# Patient Record
Sex: Female | Born: 1950 | Race: White | Hispanic: No | Marital: Married | Smoking: Never smoker
Health system: Southern US, Community
[De-identification: ages and names within clinical notes are randomized; demographics above are authoritative.]

## PROBLEM LIST (undated history)

## (undated) DIAGNOSIS — F329 Major depressive disorder, single episode, unspecified: Secondary | ICD-10-CM

## (undated) DIAGNOSIS — R945 Abnormal results of liver function studies: Secondary | ICD-10-CM

## (undated) DIAGNOSIS — Z860100 Personal history of colon polyps, unspecified: Secondary | ICD-10-CM

## (undated) DIAGNOSIS — F419 Anxiety disorder, unspecified: Secondary | ICD-10-CM

## (undated) DIAGNOSIS — K912 Postsurgical malabsorption, not elsewhere classified: Secondary | ICD-10-CM

## (undated) DIAGNOSIS — K900 Celiac disease: Secondary | ICD-10-CM

## (undated) DIAGNOSIS — F32A Depression, unspecified: Secondary | ICD-10-CM

## (undated) DIAGNOSIS — F319 Bipolar disorder, unspecified: Secondary | ICD-10-CM

## (undated) DIAGNOSIS — J309 Allergic rhinitis, unspecified: Secondary | ICD-10-CM

## (undated) DIAGNOSIS — M810 Age-related osteoporosis without current pathological fracture: Secondary | ICD-10-CM

## (undated) DIAGNOSIS — R7989 Other specified abnormal findings of blood chemistry: Secondary | ICD-10-CM

## (undated) DIAGNOSIS — D72819 Decreased white blood cell count, unspecified: Secondary | ICD-10-CM

## (undated) DIAGNOSIS — K567 Ileus, unspecified: Secondary | ICD-10-CM

## (undated) DIAGNOSIS — K9189 Other postprocedural complications and disorders of digestive system: Secondary | ICD-10-CM

## (undated) DIAGNOSIS — R748 Abnormal levels of other serum enzymes: Secondary | ICD-10-CM

## (undated) DIAGNOSIS — E039 Hypothyroidism, unspecified: Secondary | ICD-10-CM

## (undated) DIAGNOSIS — I4510 Unspecified right bundle-branch block: Secondary | ICD-10-CM

## (undated) DIAGNOSIS — N200 Calculus of kidney: Secondary | ICD-10-CM

## (undated) HISTORY — PX: TUBAL LIGATION: SHX77

## (undated) HISTORY — DX: Other specified abnormal findings of blood chemistry: R79.89

## (undated) HISTORY — DX: Allergic rhinitis, unspecified: J30.9

## (undated) HISTORY — PX: COLONOSCOPY W/ POLYPECTOMY: SHX1380

## (undated) HISTORY — PX: TONSILLECTOMY AND ADENOIDECTOMY: SUR1326

## (undated) HISTORY — DX: Abnormal levels of other serum enzymes: R74.8

## (undated) HISTORY — DX: Unspecified right bundle-branch block: I45.10

## (undated) HISTORY — DX: Decreased white blood cell count, unspecified: D72.819

## (undated) HISTORY — DX: Major depressive disorder, single episode, unspecified: F32.9

## (undated) HISTORY — DX: Calculus of kidney: N20.0

## (undated) HISTORY — PX: COLON SURGERY: SHX602

## (undated) HISTORY — DX: Age-related osteoporosis without current pathological fracture: M81.0

## (undated) HISTORY — DX: Abnormal results of liver function studies: R94.5

## (undated) HISTORY — PX: CYSTECTOMY: SUR359

## (undated) HISTORY — DX: Depression, unspecified: F32.A

## (undated) HISTORY — PX: FOOT SURGERY: SHX648

---

## 1997-11-22 ENCOUNTER — Other Ambulatory Visit: Admission: RE | Admit: 1997-11-22 | Discharge: 1997-11-22 | Payer: Self-pay | Admitting: Obstetrics and Gynecology

## 1998-03-11 ENCOUNTER — Ambulatory Visit (HOSPITAL_BASED_OUTPATIENT_CLINIC_OR_DEPARTMENT_OTHER): Admission: RE | Admit: 1998-03-11 | Discharge: 1998-03-11 | Payer: Self-pay | Admitting: General Surgery

## 1999-06-17 ENCOUNTER — Encounter: Admission: RE | Admit: 1999-06-17 | Discharge: 1999-06-17 | Payer: Self-pay | Admitting: Obstetrics and Gynecology

## 1999-06-17 ENCOUNTER — Encounter: Payer: Self-pay | Admitting: Obstetrics and Gynecology

## 1999-06-23 ENCOUNTER — Encounter: Admission: RE | Admit: 1999-06-23 | Discharge: 1999-06-23 | Payer: Self-pay | Admitting: Obstetrics and Gynecology

## 1999-06-23 ENCOUNTER — Encounter: Payer: Self-pay | Admitting: Obstetrics and Gynecology

## 2000-03-22 ENCOUNTER — Other Ambulatory Visit: Admission: RE | Admit: 2000-03-22 | Discharge: 2000-03-22 | Payer: Self-pay | Admitting: Obstetrics and Gynecology

## 2000-11-09 ENCOUNTER — Encounter: Admission: RE | Admit: 2000-11-09 | Discharge: 2000-11-09 | Payer: Self-pay | Admitting: Obstetrics and Gynecology

## 2000-11-09 ENCOUNTER — Encounter: Payer: Self-pay | Admitting: Obstetrics and Gynecology

## 2001-03-10 ENCOUNTER — Other Ambulatory Visit: Admission: RE | Admit: 2001-03-10 | Discharge: 2001-03-10 | Payer: Self-pay | Admitting: Obstetrics and Gynecology

## 2001-11-14 ENCOUNTER — Encounter: Payer: Self-pay | Admitting: Obstetrics and Gynecology

## 2001-11-14 ENCOUNTER — Encounter: Admission: RE | Admit: 2001-11-14 | Discharge: 2001-11-14 | Payer: Self-pay | Admitting: Obstetrics and Gynecology

## 2002-06-01 ENCOUNTER — Other Ambulatory Visit: Admission: RE | Admit: 2002-06-01 | Discharge: 2002-06-01 | Payer: Self-pay | Admitting: Obstetrics and Gynecology

## 2003-06-07 ENCOUNTER — Other Ambulatory Visit: Admission: RE | Admit: 2003-06-07 | Discharge: 2003-06-07 | Payer: Self-pay | Admitting: Obstetrics and Gynecology

## 2003-07-01 ENCOUNTER — Encounter: Admission: RE | Admit: 2003-07-01 | Discharge: 2003-07-01 | Payer: Self-pay | Admitting: Obstetrics and Gynecology

## 2004-07-20 ENCOUNTER — Other Ambulatory Visit: Admission: RE | Admit: 2004-07-20 | Discharge: 2004-07-20 | Payer: Self-pay | Admitting: Obstetrics and Gynecology

## 2005-06-25 ENCOUNTER — Other Ambulatory Visit: Admission: RE | Admit: 2005-06-25 | Discharge: 2005-06-25 | Payer: Self-pay | Admitting: Obstetrics and Gynecology

## 2005-07-13 ENCOUNTER — Encounter: Admission: RE | Admit: 2005-07-13 | Discharge: 2005-07-13 | Payer: Self-pay | Admitting: Obstetrics & Gynecology

## 2005-08-06 ENCOUNTER — Encounter: Admission: RE | Admit: 2005-08-06 | Discharge: 2005-08-06 | Payer: Self-pay | Admitting: Obstetrics & Gynecology

## 2006-02-17 ENCOUNTER — Ambulatory Visit: Payer: Self-pay | Admitting: Family Medicine

## 2006-03-02 ENCOUNTER — Ambulatory Visit: Payer: Self-pay | Admitting: Internal Medicine

## 2006-03-08 ENCOUNTER — Ambulatory Visit: Payer: Self-pay | Admitting: Family Medicine

## 2006-04-06 LAB — CBC WITH DIFFERENTIAL/PLATELET
BASO%: 0.2 % (ref 0.0–2.0)
Basophils Absolute: 0 10*3/uL (ref 0.0–0.1)
EOS%: 0.5 % (ref 0.0–7.0)
Eosinophils Absolute: 0 10*3/uL (ref 0.0–0.5)
HCT: 36.5 % (ref 34.8–46.6)
HGB: 12.7 g/dL (ref 11.6–15.9)
LYMPH%: 16.5 % (ref 14.0–48.0)
MCH: 32.6 pg (ref 26.0–34.0)
MCHC: 34.8 g/dL (ref 32.0–36.0)
MCV: 93.7 fL (ref 81.0–101.0)
MONO#: 0.3 10*3/uL (ref 0.1–0.9)
MONO%: 11.5 % (ref 0.0–13.0)
NEUT#: 1.7 10*3/uL (ref 1.5–6.5)
NEUT%: 71.3 % (ref 39.6–76.8)
Platelets: 276 10*3/uL (ref 145–400)
RBC: 3.9 10*6/uL (ref 3.70–5.32)
RDW: 13.5 % (ref 11.3–14.5)
WBC: 2.4 10*3/uL — ABNORMAL LOW (ref 3.9–10.0)
lymph#: 0.4 10*3/uL — ABNORMAL LOW (ref 0.9–3.3)

## 2006-04-06 LAB — LACTATE DEHYDROGENASE: LDH: 142 U/L (ref 94–250)

## 2006-04-07 LAB — HEPATITIS B SURFACE ANTIGEN: Hepatitis B Surface Ag: NEGATIVE

## 2006-04-07 LAB — HEPATITIS B CORE ANTIBODY, IGM: Hep B C IgM: NEGATIVE

## 2006-04-07 LAB — HEPATITIS B CORE ANTIBODY, TOTAL: Hep B Core Total Ab: NEGATIVE

## 2006-04-07 LAB — HEPATITIS C ANTIBODY: HCV Ab: NEGATIVE

## 2006-04-07 LAB — HIV ANTIBODY (ROUTINE TESTING W REFLEX)

## 2006-04-07 LAB — HEPATITIS B SURFACE ANTIBODY,QUALITATIVE: Hep B S Ab: NEGATIVE

## 2006-04-22 ENCOUNTER — Encounter (INDEPENDENT_AMBULATORY_CARE_PROVIDER_SITE_OTHER): Payer: Self-pay | Admitting: Specialist

## 2006-04-22 ENCOUNTER — Ambulatory Visit (HOSPITAL_COMMUNITY): Admission: RE | Admit: 2006-04-22 | Discharge: 2006-04-22 | Payer: Self-pay | Admitting: Internal Medicine

## 2006-04-22 ENCOUNTER — Ambulatory Visit: Payer: Self-pay | Admitting: Internal Medicine

## 2006-07-20 ENCOUNTER — Other Ambulatory Visit: Admission: RE | Admit: 2006-07-20 | Discharge: 2006-07-20 | Payer: Self-pay | Admitting: Obstetrics and Gynecology

## 2006-08-11 ENCOUNTER — Encounter: Admission: RE | Admit: 2006-08-11 | Discharge: 2006-08-11 | Payer: Self-pay | Admitting: Obstetrics and Gynecology

## 2007-07-25 ENCOUNTER — Other Ambulatory Visit: Admission: RE | Admit: 2007-07-25 | Discharge: 2007-07-25 | Payer: Self-pay | Admitting: Obstetrics and Gynecology

## 2007-08-22 ENCOUNTER — Ambulatory Visit: Payer: Self-pay | Admitting: Family Medicine

## 2007-09-04 ENCOUNTER — Encounter: Admission: RE | Admit: 2007-09-04 | Discharge: 2007-09-04 | Payer: Self-pay | Admitting: Obstetrics and Gynecology

## 2008-03-01 ENCOUNTER — Ambulatory Visit: Payer: Self-pay | Admitting: Family Medicine

## 2008-03-22 ENCOUNTER — Ambulatory Visit: Payer: Self-pay | Admitting: Family Medicine

## 2008-08-26 ENCOUNTER — Other Ambulatory Visit: Admission: RE | Admit: 2008-08-26 | Discharge: 2008-08-26 | Payer: Self-pay | Admitting: Obstetrics and Gynecology

## 2008-09-17 ENCOUNTER — Encounter: Admission: RE | Admit: 2008-09-17 | Discharge: 2008-09-17 | Payer: Self-pay

## 2008-09-25 ENCOUNTER — Encounter: Admission: RE | Admit: 2008-09-25 | Discharge: 2008-09-25 | Payer: Self-pay | Admitting: Obstetrics and Gynecology

## 2008-10-10 ENCOUNTER — Encounter: Admission: RE | Admit: 2008-10-10 | Discharge: 2008-10-10 | Payer: Self-pay | Admitting: Allergy

## 2009-04-04 ENCOUNTER — Ambulatory Visit: Payer: Self-pay | Admitting: Family Medicine

## 2009-06-12 ENCOUNTER — Ambulatory Visit: Payer: Self-pay | Admitting: Family Medicine

## 2009-06-12 ENCOUNTER — Encounter (INDEPENDENT_AMBULATORY_CARE_PROVIDER_SITE_OTHER): Payer: Self-pay | Admitting: *Deleted

## 2009-06-24 ENCOUNTER — Encounter (INDEPENDENT_AMBULATORY_CARE_PROVIDER_SITE_OTHER): Payer: Self-pay | Admitting: *Deleted

## 2009-06-25 ENCOUNTER — Ambulatory Visit: Payer: Self-pay | Admitting: Gastroenterology

## 2009-06-26 ENCOUNTER — Ambulatory Visit: Payer: Self-pay | Admitting: Family Medicine

## 2009-07-25 ENCOUNTER — Ambulatory Visit: Payer: Self-pay | Admitting: Family Medicine

## 2009-08-08 ENCOUNTER — Telehealth: Payer: Self-pay | Admitting: Gastroenterology

## 2009-08-12 ENCOUNTER — Ambulatory Visit: Payer: Self-pay | Admitting: Family Medicine

## 2009-10-02 ENCOUNTER — Encounter: Admission: RE | Admit: 2009-10-02 | Discharge: 2009-10-02 | Payer: Self-pay | Admitting: Obstetrics and Gynecology

## 2009-10-24 ENCOUNTER — Encounter: Admission: RE | Admit: 2009-10-24 | Discharge: 2009-10-24 | Payer: Self-pay | Admitting: Obstetrics and Gynecology

## 2010-04-03 ENCOUNTER — Encounter: Admission: RE | Admit: 2010-04-03 | Discharge: 2010-04-03 | Payer: Self-pay | Admitting: Allergy

## 2010-04-06 ENCOUNTER — Ambulatory Visit: Payer: Self-pay | Admitting: Family Medicine

## 2010-06-01 ENCOUNTER — Encounter
Admission: RE | Admit: 2010-06-01 | Discharge: 2010-06-01 | Payer: Self-pay | Source: Home / Self Care | Attending: Otolaryngology | Admitting: Otolaryngology

## 2010-06-14 ENCOUNTER — Encounter: Payer: Self-pay | Admitting: Obstetrics and Gynecology

## 2010-06-14 ENCOUNTER — Encounter: Payer: Self-pay | Admitting: Obstetrics & Gynecology

## 2010-06-18 ENCOUNTER — Ambulatory Visit: Admit: 2010-06-18 | Payer: Self-pay | Admitting: Family Medicine

## 2010-06-22 ENCOUNTER — Ambulatory Visit
Admission: RE | Admit: 2010-06-22 | Discharge: 2010-06-22 | Payer: Self-pay | Source: Home / Self Care | Attending: Family Medicine | Admitting: Family Medicine

## 2010-06-23 NOTE — Miscellaneous (Signed)
Summary: [facility] Previsit/prep  Clinical Lists Changes  Medications: Added new medication of MOVIPREP 100 GM  SOLR (PEG-KCL-NACL-NASULF-NA ASC-C) As per prep instructions. - Signed Rx of MOVIPREP 100 GM  SOLR (PEG-KCL-NACL-NASULF-NA ASC-C) As per prep instructions.;  #1 x 0;  Signed;  Entered by: [name] RN;  Authorized by: Louis Meckel MD;  Method used: Electronically to [facility]*, [address], [place], [place]  [id number], Ph: [phone], Fax: [phone] Allergies: Added new allergy or adverse reaction of SULFA Observations: Added new observation of NKA: F (06/25/2009 8:29)    Prescriptions: MOVIPREP 100 GM  SOLR (PEG-KCL-NACL-NASULF-NA ASC-C) As per prep instructions.  #1 x 0   Entered by:   [name] RN   Authorized by:   Louis Meckel MD   Signed by:   [name] RN on 06/25/2009   Method used:   Electronically to        [facility]* (retail)       [address]       [place], [place]  [id number]       Ph: [phone]       Fax: [phone]   RxID:   [id number]

## 2010-06-23 NOTE — Letter (Signed)
Summary: Previsit letter  [facility]  [address] [place], [place] [address]   Phone: [phone]  Fax: [phone]       06/12/2009 MRN: [id number]  Mary Leach [address] [place], [place]  [address]  Dear Ms. Mary Leach,  Welcome to the Gastroenterology Division at [facility].    You are scheduled to see a nurse for your pre-procedure visit on 06-25-09 at 1pm on the 3rd floor at [facility], [address]  We ask that you try to arrive at our office 15 minutes prior to your appointment time to allow for check-in.  Your nurse visit will consist of discussing your medical and surgical history, your immediate family medical history, and your medications.    Please bring a complete list of all your medications or, if you prefer, bring the medication bottles and we will list them.  We will need to be aware of both prescribed and over the counter drugs.  We will need to know exact dosage information as well.  If you are on blood thinners (Coumadin, Plavix, Aggrenox, Ticlid, etc.) please call our office today/prior to your appointment, as we need to consult with your physician about holding your medication.   Please be prepared to read and sign documents such as consent forms, a financial agreement, and acknowledgement forms.  If necessary, and with your consent, a friend or relative is welcome to sit-in on the nurse visit with you.  Please bring your insurance card so that we may make a copy of it.  If your insurance requires a referral to see a specialist, please bring your referral form from your primary care physician.  No co-pay is required for this nurse visit.     If you cannot keep your appointment, please call [phone] to cancel or reschedule prior to your appointment date.  This allows [place] the opportunity to schedule an appointment for another patient in need of care.    Thank you for choosing  Gastroenterology for your medical needs.  We  appreciate the opportunity to care for you.  Please visit [place] at our website  to learn more about our practice.                     Sincerely.                                                                                                                   The Gastroenterology Division

## 2010-06-23 NOTE — Letter (Signed)
Summary: [facility] Instructions  [facility]  [address] [place], [place] [address]   Phone: [phone]  Fax: [phone]       Mary Leach    July 23, 1950    MRN: [id number]        Procedure Day Dorna Bloom:  Mary Leach  07/08/09     Arrival Time:  10:30AM     Procedure Time:  11:30AM     Location of Procedure:                    _X _  [facility] (4th Floor)                        PREPARATION FOR COLONOSCOPY WITH MOVIPREP   Starting 5 days prior to your procedure 07/03/09 do not eat nuts, seeds, popcorn, corn, beans, peas,  salads, or any raw vegetables.  Do not take any fiber supplements (e.g. Metamucil, Citrucel, and Benefiber).  THE DAY BEFORE YOUR PROCEDURE         DATE: 07/07/09  DAY: MONDAY  1.  Drink clear liquids the entire day-NO SOLID FOOD  2.  Do not drink anything colored red or purple.  Avoid juices with pulp.  No orange juice.  3.  Drink at least 64 oz. (8 glasses) of fluid/clear liquids during the day to prevent dehydration and help the prep work efficiently.  CLEAR LIQUIDS INCLUDE: Water Jello Ice Popsicles Tea (sugar ok, no milk/cream) Powdered fruit flavored drinks Coffee (sugar ok, no milk/cream) Gatorade Juice: apple, white grape, white cranberry  Lemonade Clear bullion, consomm, broth Carbonated beverages (any kind) Strained chicken noodle soup Hard Candy                             4.  In the morning, mix first dose of MoviPrep solution:    Empty 1 Pouch A and 1 Pouch B into the disposable container    Add lukewarm drinking water to the top line of the container. Mix to dissolve    Refrigerate (mixed solution should be used within 24 hrs)  5.  Begin drinking the prep at 5:00 p.m. The MoviPrep container is divided by 4 marks.   Every 15 minutes drink the solution down to the next mark (approximately 8 oz) until the full liter is complete.   6.  Follow completed prep with 16 oz of clear liquid of your choice (Nothing  red or purple).  Continue to drink clear liquids until bedtime.  7.  Before going to bed, mix second dose of MoviPrep solution:    Empty 1 Pouch A and 1 Pouch B into the disposable container    Add lukewarm drinking water to the top line of the container. Mix to dissolve    Refrigerate  THE DAY OF YOUR PROCEDURE      DATE: 07/08/09  DAY: TUESDAY  Beginning at 6:30AM (5 hours before procedure):         1. Every 15 minutes, drink the solution down to the next mark (approx 8 oz) until the full liter is complete.  2. Follow completed prep with 16 oz. of clear liquid of your choice.    3. You may drink clear liquids until 9:30AM (2 HOURS BEFORE PROCEDURE).   MEDICATION INSTRUCTIONS  Unless otherwise instructed, you should take regular prescription medications with a small sip of water   as early as possible the morning  of your procedure.         OTHER INSTRUCTIONS  You will need a responsible adult at least 59 years of age to accompany you and drive you home.   This person must remain in the waiting room during your procedure.  Wear loose fitting clothing that is easily removed.  Leave jewelry and other valuables at home.  However, you may wish to bring a book to read or  an iPod/MP3 player to listen to music as you wait for your procedure to start.  Remove all body piercing jewelry and leave at home.  Total time from sign-in until discharge is approximately 2-3 hours.  You should go home directly after your procedure and rest.  You can resume normal activities the  day after your procedure.  The day of your procedure you should not:   Drive   Make legal decisions   Operate machinery   Drink alcohol   Return to work  You will receive specific instructions about eating, activities and medications before you leave.    The above instructions have been reviewed and explained to me by   [name] RN  June 25, 2009 8:53 AM     I fully understand and  can verbalize these instructions _____________________________ Date _________

## 2010-06-23 NOTE — Progress Notes (Signed)
Summary: cx proc   Phone Note Call from Patient Call back at Home Phone [phone]   Caller: Patient Call For: Mary Leach Reason for Call: Talk to Nurse Summary of Call: Patient cancelled he procedure for Tues because her daughter just had a baby and she is out of town, states that she'll call back to reschedule on Monday   Are you going to charge? Initial call taken by: Tawni Levy,  August 08, 2009 10:58 AM  Follow-up for Phone Call        no Follow-up by: Louis Meckel MD,  August 11, 2009 9:31 AM  Additional Follow-up for Phone Call Additional follow up Details #1::        Patient NOT BILLED. Additional Follow-up by: Leanor Kail [facility],  August 20, 2009 12:31 PM

## 2010-08-13 ENCOUNTER — Ambulatory Visit (INDEPENDENT_AMBULATORY_CARE_PROVIDER_SITE_OTHER): Payer: 59 | Admitting: Family Medicine

## 2010-08-13 ENCOUNTER — Other Ambulatory Visit: Payer: Self-pay | Admitting: Family Medicine

## 2010-08-13 DIAGNOSIS — M25562 Pain in left knee: Secondary | ICD-10-CM

## 2010-08-13 DIAGNOSIS — M25569 Pain in unspecified knee: Secondary | ICD-10-CM

## 2010-08-17 ENCOUNTER — Ambulatory Visit
Admission: RE | Admit: 2010-08-17 | Discharge: 2010-08-17 | Disposition: A | Discharge status: Home / Self Care | Payer: 59 | Source: Ambulatory Visit | Attending: Family Medicine | Admitting: Family Medicine

## 2010-08-17 DIAGNOSIS — M25562 Pain in left knee: Secondary | ICD-10-CM

## 2010-09-18 ENCOUNTER — Other Ambulatory Visit: Payer: Self-pay | Admitting: Obstetrics and Gynecology

## 2010-09-18 DIAGNOSIS — Z1231 Encounter for screening mammogram for malignant neoplasm of breast: Secondary | ICD-10-CM

## 2010-10-05 ENCOUNTER — Ambulatory Visit
Admission: RE | Admit: 2010-10-05 | Discharge: 2010-10-05 | Disposition: A | Discharge status: Home / Self Care | Payer: 59 | Source: Ambulatory Visit | Attending: Obstetrics and Gynecology | Admitting: Obstetrics and Gynecology

## 2010-10-05 DIAGNOSIS — Z1231 Encounter for screening mammogram for malignant neoplasm of breast: Secondary | ICD-10-CM

## 2010-10-21 LAB — HM COLONOSCOPY

## 2011-04-20 ENCOUNTER — Other Ambulatory Visit (INDEPENDENT_AMBULATORY_CARE_PROVIDER_SITE_OTHER): Payer: 59

## 2011-04-20 DIAGNOSIS — Z23 Encounter for immunization: Secondary | ICD-10-CM

## 2011-04-21 ENCOUNTER — Encounter: Payer: Self-pay | Admitting: Family Medicine

## 2011-09-27 ENCOUNTER — Other Ambulatory Visit: Payer: Self-pay | Admitting: Obstetrics and Gynecology

## 2011-09-27 DIAGNOSIS — R1901 Right upper quadrant abdominal swelling, mass and lump: Secondary | ICD-10-CM

## 2011-09-28 ENCOUNTER — Ambulatory Visit
Admission: RE | Admit: 2011-09-28 | Discharge: 2011-09-28 | Disposition: A | Discharge status: Home / Self Care | Payer: 59 | Source: Ambulatory Visit | Attending: Obstetrics and Gynecology | Admitting: Obstetrics and Gynecology

## 2011-09-28 DIAGNOSIS — R1901 Right upper quadrant abdominal swelling, mass and lump: Secondary | ICD-10-CM

## 2011-10-07 ENCOUNTER — Inpatient Hospital Stay (HOSPITAL_COMMUNITY)
Admission: EM | Admit: 2011-10-07 | Discharge: 2011-10-20 | DRG: 330 | Disposition: A | Discharge status: Home / Self Care | Payer: 59 | Attending: General Surgery | Admitting: General Surgery

## 2011-10-07 ENCOUNTER — Encounter (HOSPITAL_COMMUNITY): Payer: Self-pay | Admitting: *Deleted

## 2011-10-07 ENCOUNTER — Inpatient Hospital Stay (HOSPITAL_COMMUNITY): Payer: 59 | Admitting: Anesthesiology

## 2011-10-07 ENCOUNTER — Emergency Department (HOSPITAL_COMMUNITY): Payer: 59

## 2011-10-07 ENCOUNTER — Encounter (HOSPITAL_COMMUNITY): Payer: Self-pay | Admitting: Anesthesiology

## 2011-10-07 ENCOUNTER — Encounter (HOSPITAL_COMMUNITY): Admission: EM | Disposition: A | Discharge status: Home / Self Care | Payer: Self-pay | Source: Home / Self Care

## 2011-10-07 DIAGNOSIS — Z79899 Other long term (current) drug therapy: Secondary | ICD-10-CM

## 2011-10-07 DIAGNOSIS — K9189 Other postprocedural complications and disorders of digestive system: Secondary | ICD-10-CM | POA: Diagnosis not present

## 2011-10-07 DIAGNOSIS — K631 Perforation of intestine (nontraumatic): Secondary | ICD-10-CM | POA: Diagnosis present

## 2011-10-07 DIAGNOSIS — K560 Paralytic ileus: Secondary | ICD-10-CM | POA: Diagnosis not present

## 2011-10-07 DIAGNOSIS — R198 Other specified symptoms and signs involving the digestive system and abdomen: Secondary | ICD-10-CM

## 2011-10-07 DIAGNOSIS — IMO0002 Reserved for concepts with insufficient information to code with codable children: Secondary | ICD-10-CM

## 2011-10-07 DIAGNOSIS — K6289 Other specified diseases of anus and rectum: Secondary | ICD-10-CM

## 2011-10-07 DIAGNOSIS — E871 Hypo-osmolality and hyponatremia: Secondary | ICD-10-CM | POA: Diagnosis present

## 2011-10-07 DIAGNOSIS — K912 Postsurgical malabsorption, not elsewhere classified: Secondary | ICD-10-CM | POA: Clinically undetermined

## 2011-10-07 DIAGNOSIS — K567 Ileus, unspecified: Secondary | ICD-10-CM | POA: Diagnosis not present

## 2011-10-07 DIAGNOSIS — K5649 Other impaction of intestine: Secondary | ICD-10-CM

## 2011-10-07 DIAGNOSIS — D62 Acute posthemorrhagic anemia: Secondary | ICD-10-CM | POA: Diagnosis not present

## 2011-10-07 DIAGNOSIS — Z860100 Personal history of colon polyps, unspecified: Secondary | ICD-10-CM

## 2011-10-07 DIAGNOSIS — J45909 Unspecified asthma, uncomplicated: Secondary | ICD-10-CM | POA: Diagnosis present

## 2011-10-07 DIAGNOSIS — K5641 Fecal impaction: Secondary | ICD-10-CM | POA: Diagnosis present

## 2011-10-07 DIAGNOSIS — F909 Attention-deficit hyperactivity disorder, unspecified type: Secondary | ICD-10-CM | POA: Diagnosis present

## 2011-10-07 DIAGNOSIS — J309 Allergic rhinitis, unspecified: Secondary | ICD-10-CM | POA: Diagnosis present

## 2011-10-07 HISTORY — DX: Ileus, unspecified: K56.7

## 2011-10-07 HISTORY — DX: Personal history of colon polyps, unspecified: Z86.0100

## 2011-10-07 HISTORY — DX: Postsurgical malabsorption, not elsewhere classified: K91.2

## 2011-10-07 HISTORY — PX: LAPAROTOMY: SHX154

## 2011-10-07 HISTORY — DX: Personal history of colonic polyps: Z86.010

## 2011-10-07 HISTORY — DX: Other postprocedural complications and disorders of digestive system: K91.89

## 2011-10-07 LAB — BASIC METABOLIC PANEL
BUN: 14 mg/dL (ref 6–23)
CO2: 17 mEq/L — ABNORMAL LOW (ref 19–32)
Calcium: 8.8 mg/dL (ref 8.4–10.5)
Chloride: 98 mEq/L (ref 96–112)
Creatinine, Ser: 0.48 mg/dL — ABNORMAL LOW (ref 0.50–1.10)
GFR calc Af Amer: >90 mL/min (ref >90)
GFR calc non Af Amer: >90 mL/min (ref >90)
Glucose, Bld: 117 mg/dL — ABNORMAL HIGH (ref 70–99)
Potassium: 3 mEq/L — ABNORMAL LOW (ref 3.5–5.1)
Sodium: 130 mEq/L — ABNORMAL LOW (ref 135–145)

## 2011-10-07 LAB — URINE MICROSCOPIC-ADD ON

## 2011-10-07 LAB — PROTIME-INR
INR: 1.09 (ref 0.00–1.49)
Prothrombin Time: 14.3 seconds (ref 11.6–15.2)

## 2011-10-07 LAB — CBC
HCT: 34 % — ABNORMAL LOW (ref 36.0–46.0)
Hemoglobin: 11.6 g/dL — ABNORMAL LOW (ref 12.0–15.0)
MCH: 28.9 pg (ref 26.0–34.0)
MCHC: 34.1 g/dL (ref 30.0–36.0)
MCV: 84.6 fL (ref 78.0–100.0)
Platelets: 268 10*3/uL (ref 150–400)
RBC: 4.02 MIL/uL (ref 3.87–5.11)
RDW: 13.6 % (ref 11.5–15.5)
WBC: 6 10*3/uL (ref 4.0–10.5)

## 2011-10-07 LAB — TYPE AND SCREEN
ABO/RH(D): O POS
Antibody Screen: NEGATIVE

## 2011-10-07 LAB — URINALYSIS, ROUTINE W REFLEX MICROSCOPIC
Bilirubin Urine: NEGATIVE
Glucose, UA: NEGATIVE mg/dL
Ketones, ur: 15 mg/dL — AB
Leukocytes, UA: NEGATIVE
Nitrite: NEGATIVE
Protein, ur: NEGATIVE mg/dL
Specific Gravity, Urine: 1.01 (ref 1.005–1.030)
Urobilinogen, UA: 0.2 mg/dL (ref 0.0–1.0)
pH: 8.5 — ABNORMAL HIGH (ref 5.0–8.0)

## 2011-10-07 LAB — LACTIC ACID, PLASMA: Lactic Acid, Venous: 0.8 mmol/L (ref 0.5–2.2)

## 2011-10-07 LAB — ABO/RH: ABO/RH(D): O POS

## 2011-10-07 SURGERY — LAPAROTOMY, EXPLORATORY
Anesthesia: General | Site: Abdomen | Wound class: Dirty or Infected

## 2011-10-07 MED ORDER — DIPHENHYDRAMINE HCL 50 MG/ML IJ SOLN
12.5000 mg | Freq: Four times a day (QID) | INTRAMUSCULAR | Status: DC | PRN
  Administered 2011-10-08 – 2011-10-09 (×2): 12.5 mg via INTRAVENOUS
  Filled 2011-10-07 (×2): qty 1

## 2011-10-07 MED ORDER — POTASSIUM CHLORIDE CRYS ER 20 MEQ PO TBCR: 40.0000 meq | EXTENDED_RELEASE_TABLET | Freq: Once | ORAL | Status: DC

## 2011-10-07 MED ORDER — HEPARIN SODIUM (PORCINE) 5000 UNIT/ML IJ SOLN
5000.0000 [IU] | Freq: Three times a day (TID) | INTRAMUSCULAR | Status: DC
  Administered 2011-10-08 – 2011-10-19 (×35): 5000 [IU] via SUBCUTANEOUS
  Filled 2011-10-07 (×39): qty 1

## 2011-10-07 MED ORDER — LACTATED RINGERS IV SOLN
INTRAVENOUS | Status: DC | PRN
  Administered 2011-10-07 (×2): via INTRAVENOUS

## 2011-10-07 MED ORDER — GLYCOPYRROLATE 0.2 MG/ML IJ SOLN
INTRAMUSCULAR | Status: DC | PRN
  Administered 2011-10-07: .6 mg via INTRAVENOUS

## 2011-10-07 MED ORDER — MORPHINE SULFATE (PF) 1 MG/ML IV SOLN
INTRAVENOUS | Status: DC
  Administered 2011-10-07: 16:00:00 via INTRAVENOUS
  Administered 2011-10-08: 6 mg via INTRAVENOUS
  Administered 2011-10-08: 20:00:00 via INTRAVENOUS
  Administered 2011-10-08: 1.5 mg via INTRAVENOUS
  Administered 2011-10-08: 7.19 mg via INTRAVENOUS
  Administered 2011-10-09: 4 mg via INTRAVENOUS
  Administered 2011-10-09: 18:00:00 via INTRAVENOUS
  Administered 2011-10-09: 9 mg via INTRAVENOUS
  Administered 2011-10-09: 15.45 mg via INTRAVENOUS
  Administered 2011-10-09: 08:00:00 via INTRAVENOUS
  Administered 2011-10-09: 7.5 mg via INTRAVENOUS
  Administered 2011-10-09: 14.59 mg via INTRAVENOUS
  Administered 2011-10-09: 3.98 mg via INTRAVENOUS
  Administered 2011-10-10: 4.11 mg via INTRAVENOUS
  Administered 2011-10-10: 22:00:00 via INTRAVENOUS
  Administered 2011-10-10: 6 mg via INTRAVENOUS
  Administered 2011-10-10: 3 mg via INTRAVENOUS
  Administered 2011-10-10 (×2): 12 mg via INTRAVENOUS
  Administered 2011-10-10: 7.5 mg via INTRAVENOUS
  Administered 2011-10-11: 19:00:00 via INTRAVENOUS
  Administered 2011-10-11: 10.5 mg via INTRAVENOUS
  Administered 2011-10-11: 10:00:00 via INTRAVENOUS
  Administered 2011-10-11: 6 mg via INTRAVENOUS
  Administered 2011-10-11: 1.5 mg via INTRAVENOUS
  Administered 2011-10-11: 8.43 mg via INTRAVENOUS
  Administered 2011-10-11: 12 mg via INTRAVENOUS
  Administered 2011-10-11: 7.5 mg via INTRAVENOUS
  Administered 2011-10-12: 6 mg via INTRAVENOUS
  Administered 2011-10-12: 12.87 mg via INTRAVENOUS
  Administered 2011-10-12: 9 mg via INTRAVENOUS
  Administered 2011-10-12: 17:00:00 via INTRAVENOUS
  Administered 2011-10-12: 10.99 mg via INTRAVENOUS
  Administered 2011-10-12: 5.41 mg via INTRAVENOUS
  Administered 2011-10-13: 9 mg via INTRAVENOUS
  Administered 2011-10-13: 13.5 mg via INTRAVENOUS
  Administered 2011-10-13: 4.5 mg via INTRAVENOUS
  Administered 2011-10-13: 3 mg via INTRAVENOUS
  Administered 2011-10-13: 6 mg via INTRAVENOUS
  Administered 2011-10-13: 7.5 mg via INTRAVENOUS
  Administered 2011-10-14: 01:00:00 via INTRAVENOUS
  Administered 2011-10-14: 0.9 mg via INTRAVENOUS
  Administered 2011-10-14: 3 mL via INTRAVENOUS
  Administered 2011-10-14 (×2): 0.3 mg via INTRAVENOUS
  Administered 2011-10-15: 1 mL via INTRAVENOUS
  Administered 2011-10-15: 0.6 mg via INTRAVENOUS
  Administered 2011-10-15: 0.9 mg via INTRAVENOUS
  Administered 2011-10-15: 0.6 mg via INTRAVENOUS
  Administered 2011-10-15: 0.3 mg via INTRAVENOUS
  Administered 2011-10-15: 1 mL via INTRAVENOUS
  Filled 2011-10-07 (×13): qty 25

## 2011-10-07 MED ORDER — ONDANSETRON HCL 4 MG/2ML IJ SOLN
4.0000 mg | Freq: Once | INTRAMUSCULAR | Status: AC
  Administered 2011-10-07: 4 mg via INTRAVENOUS
  Filled 2011-10-07: qty 2

## 2011-10-07 MED ORDER — SODIUM CHLORIDE 0.9 % IV BOLUS (SEPSIS)
1000.0000 mL | Freq: Once | INTRAVENOUS | Status: AC
  Administered 2011-10-07: 1000 mL via INTRAVENOUS

## 2011-10-07 MED ORDER — NALOXONE HCL 0.4 MG/ML IJ SOLN: 0.4000 mg | INTRAMUSCULAR | Status: DC | PRN

## 2011-10-07 MED ORDER — POTASSIUM CHLORIDE IN NACL 40-0.9 MEQ/L-% IV SOLN
INTRAVENOUS | Status: DC
  Administered 2011-10-07: 12:00:00 via INTRAVENOUS
  Filled 2011-10-07 (×2): qty 1000

## 2011-10-07 MED ORDER — HYDROMORPHONE HCL PF 1 MG/ML IJ SOLN
INTRAMUSCULAR | Status: AC
  Filled 2011-10-07: qty 1

## 2011-10-07 MED ORDER — LIDOCAINE HCL (CARDIAC) 20 MG/ML IV SOLN
INTRAVENOUS | Status: DC | PRN
  Administered 2011-10-07: 100 mg via INTRAVENOUS

## 2011-10-07 MED ORDER — LACTATED RINGERS IV SOLN
INTRAVENOUS | Status: DC
  Administered 2011-10-07: 18:00:00 via INTRAVENOUS

## 2011-10-07 MED ORDER — MIDAZOLAM HCL 5 MG/5ML IJ SOLN
INTRAMUSCULAR | Status: DC | PRN
  Administered 2011-10-07 (×2): 1 mg via INTRAVENOUS

## 2011-10-07 MED ORDER — PROMETHAZINE HCL 25 MG/ML IJ SOLN: 6.2500 mg | INTRAMUSCULAR | Status: DC | PRN

## 2011-10-07 MED ORDER — NEOSTIGMINE METHYLSULFATE 1 MG/ML IJ SOLN
INTRAMUSCULAR | Status: DC | PRN
  Administered 2011-10-07: 5 mg via INTRAVENOUS

## 2011-10-07 MED ORDER — SUCCINYLCHOLINE CHLORIDE 20 MG/ML IJ SOLN
INTRAMUSCULAR | Status: DC | PRN
  Administered 2011-10-07: 100 mg via INTRAVENOUS

## 2011-10-07 MED ORDER — DIPHENHYDRAMINE HCL 12.5 MG/5ML PO ELIX
12.5000 mg | ORAL_SOLUTION | Freq: Four times a day (QID) | ORAL | Status: DC | PRN
  Filled 2011-10-07: qty 5

## 2011-10-07 MED ORDER — ONDANSETRON HCL 4 MG/2ML IJ SOLN: 4.0000 mg | Freq: Four times a day (QID) | INTRAMUSCULAR | Status: DC | PRN

## 2011-10-07 MED ORDER — SODIUM CHLORIDE 0.9 % IJ SOLN: 9.0000 mL | INTRAMUSCULAR | Status: DC | PRN

## 2011-10-07 MED ORDER — LACTATED RINGERS IV SOLN
INTRAVENOUS | Status: DC | PRN
  Administered 2011-10-07 (×2): via INTRAVENOUS

## 2011-10-07 MED ORDER — MORPHINE SULFATE 2 MG/ML IJ SOLN: 1.0000 mg | INTRAMUSCULAR | Status: DC | PRN

## 2011-10-07 MED ORDER — ONDANSETRON HCL 4 MG/2ML IJ SOLN
INTRAMUSCULAR | Status: DC | PRN
  Administered 2011-10-07: 4 mg via INTRAVENOUS

## 2011-10-07 MED ORDER — ACETAMINOPHEN 10 MG/ML IV SOLN
INTRAVENOUS | Status: DC | PRN
  Administered 2011-10-07: 1000 mg via INTRAVENOUS

## 2011-10-07 MED ORDER — VITAMINS A & D EX OINT
TOPICAL_OINTMENT | CUTANEOUS | Status: AC
  Administered 2011-10-07: 23:00:00
  Filled 2011-10-07: qty 10

## 2011-10-07 MED ORDER — DEXAMETHASONE SODIUM PHOSPHATE 10 MG/ML IJ SOLN
INTRAMUSCULAR | Status: DC | PRN
  Administered 2011-10-07: 10 mg via INTRAVENOUS

## 2011-10-07 MED ORDER — POTASSIUM CHLORIDE 20 MEQ/15ML (10%) PO LIQD
40.0000 meq | Freq: Once | ORAL | Status: DC
  Filled 2011-10-07: qty 30

## 2011-10-07 MED ORDER — FENTANYL CITRATE 0.05 MG/ML IJ SOLN
50.0000 ug | Freq: Once | INTRAMUSCULAR | Status: AC
  Administered 2011-10-07: 50 ug via INTRAVENOUS
  Filled 2011-10-07: qty 2

## 2011-10-07 MED ORDER — FENTANYL CITRATE 0.05 MG/ML IJ SOLN
INTRAMUSCULAR | Status: AC
  Filled 2011-10-07: qty 2

## 2011-10-07 MED ORDER — PROMETHAZINE HCL 25 MG/ML IJ SOLN
6.2500 mg | Freq: Four times a day (QID) | INTRAMUSCULAR | Status: DC | PRN
  Filled 2011-10-07: qty 1

## 2011-10-07 MED ORDER — ONDANSETRON HCL 4 MG/2ML IJ SOLN
4.0000 mg | Freq: Four times a day (QID) | INTRAMUSCULAR | Status: DC | PRN
Start: 2011-10-07 — End: 2011-10-07

## 2011-10-07 MED ORDER — HYDROMORPHONE HCL PF 1 MG/ML IJ SOLN
INTRAMUSCULAR | Status: DC | PRN
  Administered 2011-10-07 (×2): 1 mg via INTRAVENOUS

## 2011-10-07 MED ORDER — PANTOPRAZOLE SODIUM 40 MG IV SOLR
40.0000 mg | Freq: Two times a day (BID) | INTRAVENOUS | Status: DC
  Filled 2011-10-07: qty 40

## 2011-10-07 MED ORDER — KCL IN DEXTROSE-NACL 40-5-0.9 MEQ/L-%-% IV SOLN
INTRAVENOUS | Status: DC
  Administered 2011-10-08: via INTRAVENOUS
  Filled 2011-10-07 (×7): qty 1000

## 2011-10-07 MED ORDER — SODIUM CHLORIDE 0.9 % IV SOLN
1.0000 g | INTRAVENOUS | Status: DC
  Administered 2011-10-08 – 2011-10-16 (×9): 1 g via INTRAVENOUS
  Filled 2011-10-07 (×9): qty 1

## 2011-10-07 MED ORDER — SODIUM CHLORIDE 0.9 % IV SOLN
80.0000 mg | Freq: Once | INTRAVENOUS | Status: AC
  Administered 2011-10-07: 80 mg via INTRAVENOUS
  Filled 2011-10-07: qty 80

## 2011-10-07 MED ORDER — PROPOFOL 10 MG/ML IV EMUL
INTRAVENOUS | Status: DC | PRN
  Administered 2011-10-07: 125 mg via INTRAVENOUS

## 2011-10-07 MED ORDER — ONDANSETRON HCL 4 MG PO TABS: 4.0000 mg | ORAL_TABLET | Freq: Four times a day (QID) | ORAL | Status: DC | PRN

## 2011-10-07 MED ORDER — SODIUM CHLORIDE 0.9 % IV SOLN
8.0000 mg/h | INTRAVENOUS | Status: DC
  Administered 2011-10-07: 8 mg/h via INTRAVENOUS
  Filled 2011-10-07 (×2): qty 80

## 2011-10-07 MED ORDER — WHITE PETROLATUM GEL
Status: AC
  Administered 2011-10-07: 1
  Filled 2011-10-07: qty 5

## 2011-10-07 MED ORDER — SODIUM CHLORIDE 0.9 % IV SOLN
1.0000 g | Freq: Once | INTRAVENOUS | Status: AC
  Administered 2011-10-07: 1 g via INTRAVENOUS
  Filled 2011-10-07: qty 1

## 2011-10-07 MED ORDER — MORPHINE SULFATE (PF) 1 MG/ML IV SOLN
INTRAVENOUS | Status: AC
  Filled 2011-10-07: qty 25

## 2011-10-07 MED ORDER — MORPHINE SULFATE 2 MG/ML IJ SOLN
2.0000 mg | Freq: Once | INTRAMUSCULAR | Status: AC
  Administered 2011-10-07: 2 mg via INTRAVENOUS
  Filled 2011-10-07: qty 1

## 2011-10-07 MED ORDER — ROCURONIUM BROMIDE 100 MG/10ML IV SOLN
INTRAVENOUS | Status: DC | PRN
  Administered 2011-10-07: 10 mg via INTRAVENOUS
  Administered 2011-10-07: 30 mg via INTRAVENOUS
  Administered 2011-10-07: 20 mg via INTRAVENOUS
  Administered 2011-10-07: 10 mg via INTRAVENOUS

## 2011-10-07 MED ORDER — FENTANYL CITRATE 0.05 MG/ML IJ SOLN
INTRAMUSCULAR | Status: DC | PRN
  Administered 2011-10-07 (×2): 100 ug via INTRAVENOUS
  Administered 2011-10-07: 50 ug via INTRAVENOUS
  Administered 2011-10-07 (×2): 100 ug via INTRAVENOUS
  Administered 2011-10-07: 50 ug via INTRAVENOUS

## 2011-10-07 MED ORDER — HYDROMORPHONE HCL PF 1 MG/ML IJ SOLN
0.2500 mg | INTRAMUSCULAR | Status: DC | PRN
  Administered 2011-10-07: 0.5 mg via INTRAVENOUS

## 2011-10-07 MED ORDER — FENTANYL CITRATE 0.05 MG/ML IJ SOLN
25.0000 ug | INTRAMUSCULAR | Status: DC | PRN
  Administered 2011-10-07 (×4): 25 ug via INTRAVENOUS

## 2011-10-07 MED ORDER — MORPHINE SULFATE 4 MG/ML IJ SOLN
6.0000 mg | Freq: Once | INTRAMUSCULAR | Status: AC
  Administered 2011-10-07: 6 mg via INTRAVENOUS
  Filled 2011-10-07: qty 2

## 2011-10-07 MED ORDER — IOHEXOL 300 MG/ML  SOLN
100.0000 mL | Freq: Once | INTRAMUSCULAR | Status: AC | PRN
  Administered 2011-10-07: 100 mL via INTRAVENOUS

## 2011-10-07 MED ORDER — ACETAMINOPHEN 10 MG/ML IV SOLN
INTRAVENOUS | Status: AC
  Filled 2011-10-07: qty 100

## 2011-10-07 MED ORDER — SODIUM CHLORIDE 0.9 % IV SOLN
INTRAVENOUS | Status: DC
  Administered 2011-10-07: 11:00:00 via INTRAVENOUS

## 2011-10-07 SURGICAL SUPPLY — 52 items
APPLICATOR COTTON TIP 6IN STRL (MISCELLANEOUS) ×1 IMPLANT
BLADE EXTENDED COATED 6.5IN (ELECTRODE) ×2 IMPLANT
BLADE HEX COATED 2.75 (ELECTRODE) ×2 IMPLANT
BRR ADH 5X3 SEPRAFILM 6 SHT (MISCELLANEOUS) ×1
CANISTER SUCTION 2500CC (MISCELLANEOUS) ×2 IMPLANT
CLOTH BEACON ORANGE TIMEOUT ST (SAFETY) ×2 IMPLANT
COVER MAYO STAND STRL (DRAPES) ×1 IMPLANT
DRAIN CHANNEL RND F F (WOUND CARE) ×1 IMPLANT
DRAPE LAPAROSCOPIC ABDOMINAL (DRAPES) ×2 IMPLANT
DRAPE UTILITY XL STRL (DRAPES) ×2 IMPLANT
DRAPE WARM FLUID 44X44 (DRAPE) ×1 IMPLANT
DRSG PAD ABDOMINAL 8X10 ST (GAUZE/BANDAGES/DRESSINGS) ×1 IMPLANT
ELECT REM PT RETURN 9FT ADLT (ELECTROSURGICAL) ×2
ELECTRODE REM PT RTRN 9FT ADLT (ELECTROSURGICAL) ×1 IMPLANT
EVACUATOR SILICONE 100CC (DRAIN) ×1 IMPLANT
GLOVE BIOGEL PI IND STRL 7.0 (GLOVE) ×1 IMPLANT
GLOVE BIOGEL PI INDICATOR 7.0 (GLOVE) ×1
GLOVE ECLIPSE 8.0 STRL XLNG CF (GLOVE) ×4 IMPLANT
GLOVE INDICATOR 8.0 STRL GRN (GLOVE) ×4 IMPLANT
GOWN STRL NON-REIN LRG LVL3 (GOWN DISPOSABLE) ×2 IMPLANT
GOWN STRL REIN XL XLG (GOWN DISPOSABLE) ×4 IMPLANT
KIT BASIN OR (CUSTOM PROCEDURE TRAY) ×2 IMPLANT
LIGASURE IMPACT 36 18CM CVD LR (INSTRUMENTS) ×1 IMPLANT
NS IRRIG 1000ML POUR BTL (IV SOLUTION) ×3 IMPLANT
PACK GENERAL/GYN (CUSTOM PROCEDURE TRAY) ×2 IMPLANT
POUCH OSTOMY 2 3/4  H 3804 (WOUND CARE) ×1
POUCH OSTOMY 2 3/4 H 3804 (WOUND CARE) ×1
POUCH OSTOMY 2 PC DRNBL 2.75 (WOUND CARE) IMPLANT
SEPRAFILM PROCEDURAL PACK 3X5 (MISCELLANEOUS) ×1 IMPLANT
SPONGE GAUZE 4X4 12PLY (GAUZE/BANDAGES/DRESSINGS) ×2 IMPLANT
SPONGE LAP 18X18 X RAY DECT (DISPOSABLE) ×2 IMPLANT
STAPLER PROXIMATE 75MM BLUE (STAPLE) ×1 IMPLANT
STAPLER VISISTAT 35W (STAPLE) ×2 IMPLANT
SUCTION POOLE TIP (SUCTIONS) IMPLANT
SUT ETHILON 3 0 PS 1 (SUTURE) ×2 IMPLANT
SUT PDS AB 1 CTX 36 (SUTURE) IMPLANT
SUT PDS AB 1 TP1 96 (SUTURE) ×2 IMPLANT
SUT PROLENE 2 0 SH DA (SUTURE) ×1 IMPLANT
SUT SILK 2 0 (SUTURE) ×2
SUT SILK 2 0 SH CR/8 (SUTURE) ×2 IMPLANT
SUT SILK 2-0 18XBRD TIE 12 (SUTURE) ×1 IMPLANT
SUT SILK 3 0 (SUTURE) ×2
SUT SILK 3 0 SH CR/8 (SUTURE) ×2 IMPLANT
SUT SILK 3-0 18XBRD TIE 12 (SUTURE) IMPLANT
SUT VIC AB 2-0 SH 18 (SUTURE) ×1 IMPLANT
SUT VIC AB 3-0 SH 18 (SUTURE) ×4 IMPLANT
SUT VICRYL 2 0 18  UND BR (SUTURE)
SUT VICRYL 2 0 18 UND BR (SUTURE) IMPLANT
TOWEL OR 17X26 10 PK STRL BLUE (TOWEL DISPOSABLE) ×4 IMPLANT
TOWEL OR NON WOVEN STRL DISP B (DISPOSABLE) ×1 IMPLANT
TRAY FOLEY CATH 14FRSI W/METER (CATHETERS) IMPLANT
YANKAUER SUCT BULB TIP NO VENT (SUCTIONS) IMPLANT

## 2011-10-07 NOTE — H&P (Signed)
Patient seen and examined.  Her husband was in the room.  Her situation is suspicious for a colorectal perforation or perforated ulcer.  Plan emergency exploratory laparotomy with possible bowel resection and colostomy.  I have explained the procedure and risks.  Risks include but are not limited to bleeding, infection, wound problems, anesthesia, anastomotic leak, need for colostomy and colostomy problems, injury to intraabominal organs (such as intestine, spleen, kidney, bladder, ureter, etc.), need for other operatins.  They seem to understand and agrees to proceed.

## 2011-10-07 NOTE — ED Notes (Signed)
OR to come for pt in 58mn. Pt updated, verbalizes understanding and agreement.

## 2011-10-07 NOTE — ED Notes (Signed)
Pt transported to CT ?

## 2011-10-07 NOTE — Anesthesia Preprocedure Evaluation (Addendum)
Anesthesia Evaluation  Patient identified by MRN, date of birth, ID band Patient awake    Reviewed: Allergy & Precautions, H&P , NPO status , Patient's Chart, lab work & pertinent test results  Airway Mallampati: II TM Distance: <3 FB Neck ROM: Full  Mouth opening: Limited Mouth Opening  Dental  (+) Teeth Intact and Dental Advisory Given   Pulmonary asthma ,  breath sounds clear to auscultation  Pulmonary exam normal       Cardiovascular + dysrhythmias Rhythm:Regular Rate:Normal     Neuro/Psych PSYCHIATRIC DISORDERS (ADD) negative neurological ROS  negative psych ROS   GI/Hepatic Neg liver ROS, Bowel perforation    Endo/Other  negative endocrine ROS  Renal/GU Cystectomy  negative genitourinary   Musculoskeletal negative musculoskeletal ROS (+)   Abdominal   Peds negative pediatric ROS (+)  Hematology negative hematology ROS (+)   Anesthesia Other Findings   Reproductive/Obstetrics negative OB ROS                          Anesthesia Physical Anesthesia Plan  ASA: II and Emergent  Anesthesia Plan: General   Post-op Pain Management:    Induction: Intravenous, Rapid sequence and Cricoid pressure planned  Airway Management Planned: Oral ETT  Additional Equipment:   Intra-op Plan:   Post-operative Plan: Extubation in OR  Informed Consent: I have reviewed the patients History and Physical, chart, labs and discussed the procedure including the risks, benefits and alternatives for the proposed anesthesia with the patient or authorized representative who has indicated his/her understanding and acceptance.   Dental advisory given  Plan Discussed with: CRNA  Anesthesia Plan Comments:         Anesthesia Quick Evaluation

## 2011-10-07 NOTE — ED Provider Notes (Signed)
History     CSN: [id number]  Arrival date & time 10/07/11  0435   First MD Initiated Contact with Patient 10/07/11 [phone]      Chief Complaint  Patient presents with  . Rectal Bleeding    The history is provided by the patient.   patient reports passing a hard stool this evening at approximately 10:30 PM.  She then went to bed and began having right lower quadrant abdominal pain.  This as colicky in nature.  She was able to breathe through it and go to bed and then woke up with severe pain.  She spoke with her husband and they called 911 because her pain was so severe.  She reports nausea without vomiting.  She denies diarrhea.  She's had no more hard stools.  She does report a small amount of blood initially on the stool.  EMS reports a small amount of blood on the sheets that they believe was likely coming from her rectum.  Nothing worsens her symptoms.  Nothing improves her symptoms.  Her symptoms are constant but do get worse at times  Past Medical History  Diagnosis Date  . Asthma   . ADD (attention deficit disorder)   . RBBB (right bundle branch block)   . Allergy     RHINITIS    Past Surgical History  Procedure Date  . Tonsillectomy and adenoidectomy   . Cystectomy     LEFT FOOT, RIGHT HAND    Family History  Problem Relation Age of Onset  . Heart disease Father   . Mental illness Paternal Aunt   . Cancer Maternal Grandmother     SKIN  . Hypertension Maternal Grandfather   . Heart disease Paternal Grandfather     History  Substance Use Topics  . Smoking status: Never Smoker   . Smokeless tobacco: Never Used  . Alcohol Use: Yes    OB History    Grav Para Term Preterm Abortions TAB SAB Ect Mult Living                  Review of Systems  All other systems reviewed and are negative.    Allergies  Sulfonamide derivatives  Home Medications   Current Outpatient Rx  Name Route Sig Dispense Refill  . AZELASTINE HCL 137 MCG/SPRAY NA SOLN Nasal Place 1  spray into the nose daily. Use in each nostril as directed     . CALCIUM CARBONATE 1250 MG PO TABS Oral Take 1 tablet by mouth daily.      Marland Kitchen CETIRIZINE HCL 10 MG PO TABS Oral Take 10 mg by mouth daily.      Marland Kitchen EPINEPHRINE 0.3 MG/0.3ML IJ DEVI Intramuscular Inject 0.3 mg into the muscle once.      Marland Kitchen FLUOXETINE HCL 40 MG PO CAPS Oral Take 40 mg by mouth daily.      Marland Kitchen FLUTICASONE PROPIONATE 50 MCG/ACT NA SUSP Nasal Place 2 sprays into the nose daily.      Marland Kitchen FLUTICASONE-SALMETEROL 100-50 MCG/DOSE IN AEPB Inhalation Inhale 1 puff into the lungs every 12 (twelve) hours.      Marland Kitchen MONTELUKAST SODIUM 10 MG PO TABS Oral Take 10 mg by mouth at bedtime.      . MULTI-VITAMIN/MINERALS PO TABS Oral Take 1 tablet by mouth daily.      Marland Kitchen NAPROXEN SODIUM 220 MG PO TABS Oral Take 220 mg by mouth 2 (two) times daily with a meal.        BP 137/85  Pulse 94  Temp(Src) 97.8 F (36.6 C) (Oral)  SpO2 100%  Physical Exam  Nursing note and vitals reviewed. Constitutional: She is oriented to person, place, and time. She appears well-developed and well-nourished. No distress.  HENT:  Head: Normocephalic and atraumatic.  Eyes: EOM are normal.  Neck: Normal range of motion.  Cardiovascular: Normal rate, regular rhythm and normal heart sounds.   Pulmonary/Chest: Effort normal and breath sounds normal.  Abdominal: Soft. She exhibits no distension.       Mild right lower quadrant tenderness without rebound.  No peritonitis on exam.  Genitourinary:       Patient has no rectal tenderness.  She has no obvious bleeding external hemorrhoids.  There is a small amount of blood on gloved finger.  She is to use hard stool rales noted in her rectum that I'm unable to disimpact.  Musculoskeletal: Normal range of motion.  Neurological: She is alert and oriented to person, place, and time.  Skin: Skin is warm and dry.  Psychiatric: She has a normal mood and affect. Judgment normal.    ED Course  Procedures (including critical  care time)  Labs Reviewed  CBC - Abnormal; Notable for the following:    Hemoglobin 11.6 (*)    HCT 34.0 (*)    All other components within normal limits  BASIC METABOLIC PANEL - Abnormal; Notable for the following:    Sodium 130 (*)    Potassium 3.0 (*)    CO2 17 (*)    Glucose, Bld 117 (*)    Creatinine, Ser 0.48 (*)    All other components within normal limits  URINALYSIS, ROUTINE W REFLEX MICROSCOPIC - Abnormal; Notable for the following:    pH 8.5 (*)    Hgb urine dipstick LARGE (*)    Ketones, ur 15 (*)    All other components within normal limits  URINE MICROSCOPIC-ADD ON   No results found.   No diagnosis found.    MDM  The patient is a small amount of blood on the rectal exam but this is trauma from her constipation and hard stool that she passed.  There are several more hard balls of stool in her rectum that I am unable to disimpact.  I don't believe that hard stools are actually causing her pain.  Given her mild right lower quadrant tenderness she will get a CT of her abdomen and pelvis to further evaluate.  Her labs are otherwise without significant abnormality.  Her potassium is mildly low at 3.0 this will be repleted orally.  Shows of large hematuria and this may represent a ureteral stone as the cause of her pain as it does seem to be colicky in nature.  CT will better define this.  Care to Dr Felicity Coyer, MD 10/07/11 [phone]

## 2011-10-07 NOTE — H&P (Signed)
Mary Leach is an 61 y.o. female.   Chief Complaint: Abdominal pain HPI: Patient is a 61 year old female who is normal in good health. She occasional has GI upset and when this occurs she usually has loose stools. Last night before going to bed she had a bowel movement which she said was denser and harder than normal it was accompanied by blood in the toilet. Sounds like she had at least a fair amount of blood enough to color the water in the toilet. She went to bed and was apparently doing well up until 3:30 in the morning. At that time she woke up laterally screaning with lower abdominal pain. Her husband said she complained initially of the left side but then it went to both right and the left side below the umbilicus. Pain was so bad she couldn't move. Another bowel movement in the bed which was bloody. She was brought to the ER by EMS. The pain is never completely resolve it seems to wax and wane, its improved some with medication in the ER. She continues to have pain is asking for more pain medicine. It hurts for her to turn over. Rectal exam by the ER physician shows hard stools that he was unable to disimpact. CT scan obtained in the emergency room as a single tiny focus of 3 and repair intraperitoneal air in the right upper quadrant adjacent gallbladder with the possibility of a perforated ulcer. There is no definite gastric wall thickening identified no evidence of extravasated GI contrast or material large bowel loops were grossly normal there's a question of a thickened ascending colon versus and nondistended artifact. 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 WBC is normal sodium is 1:30 potassium is 3.0. We're asked to see in consultation.  Past Medical History  Diagnosis Date  . Asthma   . ADD (attention deficit disorder)   . RBBB (right bundle branch block)   . Allergy    RHINITIS  . History of colonic polyps     Past Surgical History  Procedure Date  . Tonsillectomy and adenoidectomy   . Cystectomy     LEFT FOOT, RIGHT HAND  . Colonoscopy w/ polypectomy History of tubal ligation.     Family History  Problem Relation Age of Onset  . Heart disease Father   . Mental illness Paternal Aunt   . Cancer Maternal Grandmother     SKIN  . Hypertension Maternal Grandfather   . Heart disease Paternal Grandfather    Social History:  reports that she has never smoked. She has never used smokeless tobacco. She reports that she drinks alcohol. She reports that she does not use illicit drugs.  Allergies:  Allergies  Allergen Reactions  . Sulfonamide Derivatives     REACTION: rash  Home meds: Naprosyn 220 mg 1-2 twice a day she is down to 1 daily. Multivitamin one daily Azelastine HCL nasal spay daily,zyrtec 1 daily, Prozac 40 mg, fluticasone spray, salmeterol/fluticasone 100/50 1 inh bid, Montelukast 10 mg daily.   (Not in a hospital admission)  Results for orders placed during the hospital encounter of 10/07/11 (from the past 48 hour(s))  CBC     Status: Abnormal   Collection Time   10/07/11  5:12 AM      Component Value Range Comment   WBC 6.0  4.0 - 10.5 (K/uL)    RBC  4.02  3.87 - 5.11 (MIL/uL)    Hemoglobin 11.6 (*) 12.0 - 15.0 (g/dL)    HCT 34.0 (*) 36.0 - 46.0 (%)    MCV 84.6  78.0 - 100.0 (fL)    MCH 28.9  26.0 - 34.0 (pg)    MCHC 34.1  30.0 - 36.0 (g/dL)    RDW 13.6  11.5 - 15.5 (%)    Platelets 268  150 - 400 (K/uL)   BASIC METABOLIC PANEL     Status: Abnormal   Collection Time   10/07/11  5:12 AM      Component Value Range Comment   Sodium 130 (*) 135 - 145 (mEq/L)    Potassium 3.0 (*) 3.5 - 5.1 (mEq/L)    Chloride 98  96 - 112 (mEq/L)    CO2 17 (*) 19 - 32 (mEq/L)    Glucose, Bld 117 (*) 70 - 99 (mg/dL)    BUN 14  6 - 23 (mg/dL)    Creatinine, Ser 0.48 (*) 0.50 - 1.10 (mg/dL)    Calcium 8.8  8.4 - 10.5 (mg/dL)    GFR calc non Af  Amer >90  >90 (mL/min)    GFR calc Af Amer >90  >90 (mL/min)   URINALYSIS, ROUTINE W REFLEX MICROSCOPIC     Status: Abnormal   Collection Time   10/07/11  6:31 AM      Component Value Range Comment   Color, Urine YELLOW  YELLOW     APPearance CLEAR  CLEAR     Specific Gravity, Urine 1.010  1.005 - 1.030     pH 8.5 (*) 5.0 - 8.0     Glucose, UA NEGATIVE  NEGATIVE (mg/dL)    Hgb urine dipstick LARGE (*) NEGATIVE     Bilirubin Urine NEGATIVE  NEGATIVE     Ketones, ur 15 (*) NEGATIVE (mg/dL)    Protein, ur NEGATIVE  NEGATIVE (mg/dL)    Urobilinogen, UA 0.2  0.0 - 1.0 (mg/dL)    Nitrite NEGATIVE  NEGATIVE     Leukocytes, UA NEGATIVE  NEGATIVE    URINE MICROSCOPIC-ADD ON     Status: Normal   Collection Time   10/07/11  6:31 AM      Component Value Range Comment   Squamous Epithelial / LPF RARE  RARE     WBC, UA 0-2  <3 (WBC/hpf)    RBC / HPF 3-6  <3 (RBC/hpf)    Bacteria, UA RARE  RARE    PROTIME-INR     Status: Normal   Collection Time   10/07/11 10:30 AM      Component Value Range Comment   Prothrombin Time 14.3  11.6 - 15.2 (seconds)    INR 1.09  0.00 - 1.49    TYPE AND SCREEN     Status: Normal   Collection Time   10/07/11 10:30 AM      Component Value Range Comment   ABO/RH(D) O POS      Antibody Screen NEG      Sample Expiration 10/10/2011     LACTIC ACID, PLASMA     Status: Normal   Collection Time   10/07/11 10:30 AM      Component Value Range Comment   Lactic Acid, Venous 0.8  0.5 - 2.2 (mmol/L)   ABO/RH     Status: Normal   Collection Time   10/07/11 10:30 AM      Component Value Range Comment   ABO/RH(D) O POS      Ct  Abdomen Pelvis W Contrast  10/07/2011  *RADIOLOGY REPORT*  Clinical Data: Abdominal and lower pelvic pain, hematochezia, abdominal distention, dry heaves  CT ABDOMEN AND PELVIS WITH CONTRAST  Technique:  Multidetector CT imaging of the abdomen and pelvis was performed following the standard protocol during bolus administration of intravenous contrast.  Sagittal and coronal MPR images reconstructed from axial data set.  Contrast: 13m OMNIPAQUE IOHEXOL 300 MG/ML  SOLN Dilute oral contrast.  Comparison: None  Findings: Lungs clear. Nonspecific 11 x 5 mm diameter liver focus right lobe image 27. Remainder of liver, spleen, pancreas, kidneys, or adrenal glands. Retroperitoneal gas is identified in the upper abdomen, mid abdomen, and pelvis. Additional gas is seen in the mesentery. Single tiny focus of free intraperitoneal air in right upper quadrant with questionably additional gas adjacent to the gallbladder. Findings are compatible with a perforated viscus. Possibility of a perforated ulcer is raised. No definite gastric wall thickening identified. No evidence of extravasated GI contrast material. Large and small bowel loops grossly normal appearance. Questionable wall thickening of the descending colon versus underdistension artifact image 51.  Bladder, uterus and adnexae unremarkable. Redundant cecum located in right upper quadrant. Appendix not localized. No definite mass or adenopathy. Small amount of low attenuation free pelvic fluid. Umbilical hernia containing fat. 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 No additional osseous findings.  IMPRESSION: Extensive retroperitoneal gas with a significant portion of gas located near the duodenum, raising question of perforated ulcer at the second or third / retroperitoneal portions of the duodenum.  Additional gas is seen within the retroperitoneum in the mid abdomen and pelvis, favor dissecting caudally from perforated duodenal ulcer. Single tiny focus of free intraperitoneal air is also identified with question additional gas adjacent to the gallbladder consistent with intraperitoneal perforation as well.  Single questionable site of the colonic wall thickening at the distal descending colon versus underdistension artifact. Umbilical hernia containing fat. Tiny nonspecific low attenuation  focus within liver. Arachnoid cyst sacral spinal canal.  Critical Value/emergent results were called by telephone at the time of interpretation on 10/07/2011 at 1018 hours to Dr. KWilson Singer who verbally acknowledged these results.  Original Report Authenticated By: MBurnetta Sabin M.D.    Review of Systems  Constitutional: Negative.   HENT: Negative.   Eyes: Negative.   Respiratory: Negative.        Asthma exacerbated by sinusitis, none currently.  Cardiovascular: Negative.   Gastrointestinal: Positive for nausea (dry heaves in ER), abdominal pain (Woke up screaming with pain), diarrhea (normally what she has with abdominal issue, but not this time), constipation (last Pm she had hard stools with blood in her toilet, new not usually a problem.  She had no blood on her undrewear when she went to bed.  When she woke up with pain she had another bloody stool) and blood in stool. Negative for heartburn, vomiting and melena.  Genitourinary: Negative.   Musculoskeletal: Positive for joint pain (Knee pain had been on naprosyn bid, now down to one per day.).  Skin: Negative.   Neurological: Negative.   Endo/Heme/Allergies: Negative.   Psychiatric/Behavioral:       Hx ADD diagnosed at age 89on Prozac    Blood pressure 105/58, pulse 87, temperature 99.2 F (37.3 C), temperature source Oral, resp. rate 24, SpO2 97.00%. Physical Exam  Constitutional: She is oriented to person, place, and time. She appears well-developed and well-nourished. She appears distressed.       Anxious having  ongoing pain.  HENT:  Head: Normocephalic and atraumatic.  Nose: Nose normal.  Eyes: Conjunctivae and EOM are normal. Pupils are equal, round, and reactive to light. Right eye exhibits no discharge. Left eye exhibits no discharge. No scleral icterus.  Neck: Normal range of motion. Neck supple. No JVD present. No tracheal deviation present. No thyromegaly present.  Cardiovascular: Normal heart sounds and intact distal  pulses.  Exam reveals no gallop.   No murmur heard.      Tachycardic, hx of RBBB  Respiratory: Effort normal and breath sounds normal. No stridor. No respiratory distress. She has no wheezes. She has no rales. She exhibits no tenderness.  GI: Soft. Bowel sounds are normal. She exhibits no distension and no mass. There is tenderness (extremely tender complains of pain at first on left but now both sides of abdomen below the umbilicus). There is no rebound and no guarding.       It hurts to move, said she could not move when pain hit her at the house 3:30 AM  Musculoskeletal: Normal range of motion.  Lymphadenopathy:    She has no cervical adenopathy.  Neurological: She is alert and oriented to person, place, and time. She has normal reflexes. No cranial nerve deficit.  Skin: Skin is warm and dry. No rash noted. She is not diaphoretic. No erythema. No pallor.       Feels like she has a fever  Psychiatric: She has a normal mood and affect. Her behavior is normal. Judgment and thought content normal.     Assessment/Plan 1. Abdominal pain with free air/visceral perforation. 2. History of asthma on chronic medication 3. History of adult ADD on medication. 4. History of right bundle branch block found an asthma workup. No history of chest pain, no cardiac history. 5.Asthma/Rhinitis   Plan: Patient was seen by Dr. Zella Richer and tentatively plan exploratory laparotomy for visceral perforation with further treatment as indicated. Will [facility] physician assistant for Dr. Jackolyn Confer.     Sanika Brosious 10/07/2011, 11:25 AM

## 2011-10-07 NOTE — ED Notes (Signed)
JJO:[id number]<SA> Expected date:<BR> Expected time:<BR> Means of arrival:<BR> Comments:<BR> EMS/abd pain/rectal bleeding

## 2011-10-07 NOTE — ED Notes (Signed)
Pt was not able to have a bowel movement

## 2011-10-07 NOTE — ED Provider Notes (Signed)
Assumed care from Dr Venora Maples in signout. Subsequent CT a/p significant for free retroperitoneal air, likely from perforated duodenal ulcer.  My abdominal exam with moderate diffuse tenderness, worse across the lower abdomen with some voluntary guarding when palpating in this area. Soft. No distension. No rebound. Pt informed of CT results and need for surgical evaluation. Pt with hx of tubal ligation, otherwise no abdominal/pelvic procedures. Reports hx of ulcers diagnosed years ago. Not on meds for them. NPO. Abx. Additional labs, EKG and CXR for pre-op.   Ct Abdomen Pelvis W Contrast  10/07/2011  *RADIOLOGY REPORT*  Clinical Data: Abdominal and lower pelvic pain, hematochezia, abdominal distention, dry heaves  CT ABDOMEN AND PELVIS WITH CONTRAST  Technique:  Multidetector CT imaging of the abdomen and pelvis was performed following the standard protocol during bolus administration of intravenous contrast. Sagittal and coronal MPR images reconstructed from axial data set.  Contrast: 160m OMNIPAQUE IOHEXOL 300 MG/ML  SOLN Dilute oral contrast.  Comparison: None  Findings: Lungs clear. Nonspecific 11 x 5 mm diameter liver focus right lobe image 27. Remainder of liver, spleen, pancreas, kidneys, or adrenal glands. Retroperitoneal gas is identified in the upper abdomen, mid abdomen, and pelvis. Additional gas is seen in the mesentery. Single tiny focus of free intraperitoneal air in right upper quadrant with questionably additional gas adjacent to the gallbladder. Findings are compatible with a perforated viscus. Possibility of a perforated ulcer is raised. No definite gastric wall thickening identified. No evidence of extravasated GI contrast material. Large and small bowel loops grossly normal appearance. Questionable wall thickening of the descending colon versus underdistension artifact image 51.  Bladder, uterus and adnexae unremarkable. Redundant cecum located in right upper quadrant. Appendix not localized.  No definite mass or adenopathy. Small amount of low attenuation free pelvic fluid. Umbilical hernia containing fat. 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 No additional osseous findings.  IMPRESSION: Extensive retroperitoneal gas with a significant portion of gas located near the duodenum, raising question of perforated ulcer at the second or third / retroperitoneal portions of the duodenum.  Additional gas is seen within the retroperitoneum in the mid abdomen and pelvis, favor dissecting caudally from perforated duodenal ulcer. Single tiny focus of free intraperitoneal air is also identified with question additional gas adjacent to the gallbladder consistent with intraperitoneal perforation as well.  Single questionable site of the colonic wall thickening at the distal descending colon versus underdistension artifact. Umbilical hernia containing fat. Tiny nonspecific low attenuation focus within liver. Arachnoid cyst sacral spinal canal.  Critical Value/emergent results were called by telephone at the time of interpretation on 10/07/2011 at 1018 hours to Dr. KWilson Singer who verbally acknowledged these results.  Original Report Authenticated By: MBurnetta Sabin M.D.   10:50 AM Discussed with Will, general surgery PA. Will eval pt in ED.  CRITICAL CARE Performed by: KVirgel Manifold  Total critical care time: 30 minutes  Critical care time was exclusive of separately billable procedures and treating other patients.  Critical care was necessary to treat or prevent imminent or life-threatening deterioration.  Critical care was time spent personally by me on the following activities: development of treatment plan with patient and/or surrogate as well as nursing, discussions with consultants, evaluation of patient's response to treatment, examination of patient, obtaining history from patient or surrogate, ordering and performing treatments and interventions, ordering and review of  laboratory studies, ordering and review of radiographic studies, pulse oximetry and re-evaluation of patient's condition.  SAnnie Main  Wilson Singer, MD 10/07/11 1051

## 2011-10-07 NOTE — Transfer of Care (Signed)
Immediate Anesthesia Transfer of Care Note  Patient: Mary Leach  Procedure(s) Performed: Procedure(s) (LRB): EXPLORATORY LAPAROTOMY (N/A)  Patient Location: PACU  Anesthesia Type: General  Level of Consciousness: awake, alert  and oriented  Airway & Oxygen Therapy: Patient Spontanous Breathing and Patient connected to face mask oxygen  Post-op Assessment: Report given to PACU RN and Post -op Vital signs reviewed and stable  Post vital signs: Reviewed and stable  Complications: No apparent anesthesia complications

## 2011-10-07 NOTE — ED Notes (Addendum)
Per EMS: pt coming from home. Pt reports having a hard bowel movement 1030 pm last night, lower abdominal pain/cramping. EMS reports upon arrival with approximately 20 mL of blood from her rectum. Pt is A&O x4. Pt reports pain 10/10.

## 2011-10-07 NOTE — ED Notes (Signed)
While in the bathroom, patient became light-headed and said she needed to lay down. This Probation officer entered the bathroom to stay with patient and assist her to a wheelchair to get back to the room. Patient back in bed at this time.

## 2011-10-07 NOTE — Op Note (Signed)
Operative Note  Mary Leach female 61 y.o. 10/07/2011  PREOPERATIVE DX:  Perforated viscus  POSTOPERATIVE DX:  Perforated proximal rectum secondary to fecal impaction  PROCEDURE:Emergency exploratory laparotomy, resection of part of rectosigmoid colon, colostomy, fecal disimpaction.         Surgeon: Odis Hollingshead   Assistants: Fanny Skates  Anesthesia: General endotracheal anesthesia  Indications: This is a 61 year old female who had a hard bowel movement and had pain in the right lower quadrant and lower abdominal areas it progressively worsened. The onset of the pain was abrupt. She presented to the emergency department for evaluation. A CT scan demonstrated a pneumoperitoneum with a significant amount of air in the retroperitoneum. She is brought to the operating room for emergency exploratory laparotomy.    Procedure Detail:  She was brought to the operating room placed supine on the operating table and a general anesthetic was given. A Foley catheter was already in place. A nasogastric tube was inserted. She was placed in the lithotomy position. Digital fecal disimpaction was performed removing a number of large hard stool balls.  The abdominal wall was then sterilely prepped and draped. A midline incision was made dividing the skin subcutaneous tissue fascia and peritoneum with electrocautery.  A small epigastric hernia and a small umbilical hernia were encountered. The upper abdomen appeared clean with no evidence of duodenal or gastric pathology. There is no evidence of small bowel pathology. There was purulent fluid in the pelvis. There was a large amount of hard stool noted in the colon. Exposure of the pelvic area demonstrated the proximal rectum to be ischemic with a perforation and it leaked a small amount of stool.  The rectum was distended with impacted stool present. I mobilized the rectum below the area of perforation staying in the posterior midline and close to  the rectum. I then divided the mid rectum with the linear cutting stapler below the area of perforation. The rectal stump was marked with 2 2-0 Prolene sutures.  The rectum and distal sigmoid colon and the sigmoid colon were mobilized by dividing lateral attachments. The ureters were identified and kept out of the plane of dissection. I then resected part of the distal sigmoid colon using the GIA stapler and the area of rectal perforation. The mesentery was divided with the LigaSure.  The specimen was handed off the field.  Following this part of the stool still in the rectum was milked and I did another fecal disimpaction to clean the rectal stump free from stool. In this disimpaction as well as the first one blood was noted to be coating some of the stool.  The abdominal cavity was then copiously irrigated with saline solution. A size 19 Blake drain was then placed through a small wound in the right lower quadrant and positioned into the pelvis. It was anchored to the skin with 3-0 nylon suture. Hemostasis was adequate at this time.  In the left midabdominal area full-thickness circular skin incision was made. A cruciate incision was made in the anterior and posterior fascial areas. The sigmoid colon stump was then passed through the incision for the colostomy. The colon was anchored to the anterior fascia in 4 quadrants with 2-0 Vicryl sutures.  Seprafilm was then placed in the wound. The midline fascia was then closed with a running 1 double looped PDS suture repairing the hernia defects as well. Subcutaneous tissue was packed with saline moistened gauze. The colostomy was then matured with interrupted 3-0 Vicryl sutures and a  colostomy appliance placed. A dry dressing was placed around the drain which was hooked to closed suction. A dry dressing was placed over the midline wound.  She tolerated the procedure well without any apparent complications.      Findings: Perforation of proximal  rectum secondary to fecal impaction  Estimated Blood Loss:  300 mL         Drains: JACKSON-PRATT (JP)          Blood Given: none          Specimens: Rectosigmoid colon        Complications:  * No complications entered in OR log *         Disposition: PACU - hemodynamically stable.         Condition: stable

## 2011-10-07 NOTE — Preoperative (Signed)
Beta Blockers   Reason not to administer Beta Blockers:Not Applicable 

## 2011-10-07 NOTE — Anesthesia Postprocedure Evaluation (Signed)
Anesthesia Post Note  Patient: Mary Leach  Procedure(s) Performed: Procedure(s) (LRB): EXPLORATORY LAPAROTOMY (N/A)  Anesthesia type: General  Patient location: PACU  Post pain: Pain level controlled  Post assessment: Post-op Vital signs reviewed  Last Vitals:  Filed Vitals:   10/07/11 1645  BP: 112/68  Pulse: 94  Temp:   Resp: 18    Post vital signs: Reviewed  Level of consciousness: sedated  Complications: No apparent anesthesia complications

## 2011-10-07 NOTE — ED Notes (Signed)
Pt sts she is no longer lightheaded. Resting in bed. Did not note any more blood while in restroom. Awaiting CT scan.

## 2011-10-08 DIAGNOSIS — K631 Perforation of intestine (nontraumatic): Secondary | ICD-10-CM | POA: Diagnosis present

## 2011-10-08 LAB — BASIC METABOLIC PANEL
BUN: 6 mg/dL (ref 6–23)
CO2: 23 mEq/L (ref 19–32)
Calcium: 8 mg/dL — ABNORMAL LOW (ref 8.4–10.5)
Chloride: 98 mEq/L (ref 96–112)
Creatinine, Ser: 0.51 mg/dL (ref 0.50–1.10)
GFR calc Af Amer: >90 mL/min (ref >90)
GFR calc non Af Amer: >90 mL/min (ref >90)
Glucose, Bld: 169 mg/dL — ABNORMAL HIGH (ref 70–99)
Potassium: 4.1 mEq/L (ref 3.5–5.1)
Sodium: 129 mEq/L — ABNORMAL LOW (ref 135–145)

## 2011-10-08 LAB — CBC
HCT: 29 % — ABNORMAL LOW (ref 36.0–46.0)
Hemoglobin: 9.8 g/dL — ABNORMAL LOW (ref 12.0–15.0)
MCH: 29 pg (ref 26.0–34.0)
MCHC: 33.8 g/dL (ref 30.0–36.0)
MCV: 85.8 fL (ref 78.0–100.0)
Platelets: 222 10*3/uL (ref 150–400)
RBC: 3.38 MIL/uL — ABNORMAL LOW (ref 3.87–5.11)
RDW: 14.2 % (ref 11.5–15.5)
WBC: 6.6 10*3/uL (ref 4.0–10.5)

## 2011-10-08 MED ORDER — MENTHOL 3 MG MT LOZG
1.0000 | LOZENGE | OROMUCOSAL | Status: DC | PRN
  Filled 2011-10-08: qty 9

## 2011-10-08 MED ORDER — KCL IN DEXTROSE-NACL 20-5-0.9 MEQ/L-%-% IV SOLN
INTRAVENOUS | Status: DC
  Administered 2011-10-08 – 2011-10-09 (×3): via INTRAVENOUS
  Administered 2011-10-09: 100 mL/h via INTRAVENOUS
  Administered 2011-10-09: via INTRAVENOUS
  Administered 2011-10-10: 1000 mL via INTRAVENOUS
  Administered 2011-10-10: 75 mL/h via INTRAVENOUS
  Administered 2011-10-11 – 2011-10-16 (×11): via INTRAVENOUS
  Filled 2011-10-08 (×23): qty 1000

## 2011-10-08 MED ORDER — LIP MEDEX EX OINT
TOPICAL_OINTMENT | CUTANEOUS | Status: DC | PRN
  Filled 2011-10-08: qty 7

## 2011-10-08 NOTE — Progress Notes (Signed)
1 Day Post-Op  Subjective: Very sore.  Awake and alert   Objective: Vital signs in last 24 hours: Temp:  [97.2 F (36.2 C)-99.5 F (37.5 C)] 98.4 F (36.9 C) (05/17 0610) Pulse Rate:  [86-126] 97  (05/17 0610) Resp:  [11-24] 16  (05/17 0610) BP: (103-152)/(58-80) 103/63 mmHg (05/17 0610) SpO2:  [94 %-100 %] 98 % (05/17 0610) FiO2 (%):  [100 %] 100 % (05/16 1617) Weight:  [125 lb (56.7 kg)] 125 lb (56.7 kg) (05/16 1815)    Intake/Output from previous day: 05/16 0701 - 05/17 0700 In: 3330 [I.V.:3300; NG/GT:30] Out: 2805 [Urine:2225; Drains:280; Blood:300] Intake/Output this shift: Total I/O In: 530 [I.V.:500; NG/GT:30] Out: 830 [Urine:700; Drains:130]  PE: Abd-slightly firm and distended, serous drain output, dressing dry, colostomy pink and edematous  Lab Results:   Basename 10/08/11 0423 10/07/11 0512  WBC 6.6 6.0  HGB 9.8* 11.6*  HCT 29.0* 34.0*  PLT 222 268   BMET  Basename 10/08/11 0423 10/07/11 0512  NA 129* 130*  K 4.1 3.0*  CL 98 98  CO2 23 17*  GLUCOSE 169* 117*  BUN 6 14  CREATININE 0.51 0.48*  CALCIUM 8.0* 8.8   PT/INR  Basename 10/07/11 1030  LABPROT 14.3  INR 1.09   Comprehensive Metabolic Panel:    Component Value Date/Time   NA 129* 10/08/2011 0423   K 4.1 10/08/2011 0423   CL 98 10/08/2011 0423   CO2 23 10/08/2011 0423   BUN 6 10/08/2011 0423   CREATININE 0.51 10/08/2011 0423   GLUCOSE 169* 10/08/2011 0423   CALCIUM 8.0* 10/08/2011 0423     Studies/Results: Dg Chest 2 View  10/07/2011  **ADDENDUM** CREATED: 10/07/2011 11:57:42  Bowel loops under the left diaphragm, difficult to exclude free intraperitoneal air. No free intraperitoneal air identified under right diaphragm. The patient has evidence of retroperitoneal and free intraperitoneal air on preceding CT chest, previously reported.  **END ADDENDUM** SIGNED BY: Elta [place] A. Thornton Papas, M.D.   10/07/2011  *RADIOLOGY REPORT*  Clinical Data:  Preoperative assessment for bleeding ulcer, history  asthma, hypertension  CHEST - 2 VIEW  Comparison: 10/10/2008  Findings: Normal heart size and pulmonary vascularity. Elongation thoracic aorta. Mild peribronchial thickening and question minimal hyperaeration. No acute infiltrate, pleural effusion or pneumothorax. No acute osseous findings.  IMPRESSION: Mild bronchitic and question minimal hyperaeration. No acute abnormalities.  Original Report Authenticated By: Burnetta Sabin, M.D.   Ct Abdomen Pelvis W Contrast  10/07/2011  *RADIOLOGY REPORT*  Clinical Data: Abdominal and lower pelvic pain, hematochezia, abdominal distention, dry heaves  CT ABDOMEN AND PELVIS WITH CONTRAST  Technique:  Multidetector CT imaging of the abdomen and pelvis was performed following the standard protocol during bolus administration of intravenous contrast. Sagittal and coronal MPR images reconstructed from axial data set.  Contrast: 18m OMNIPAQUE IOHEXOL 300 MG/ML  SOLN Dilute oral contrast.  Comparison: None  Findings: Lungs clear. Nonspecific 11 x 5 mm diameter liver focus right lobe image 27. Remainder of liver, spleen, pancreas, kidneys, or adrenal glands. Retroperitoneal gas is identified in the upper abdomen, mid abdomen, and pelvis. Additional gas is seen in the mesentery. Single tiny focus of free intraperitoneal air in right upper quadrant with questionably additional gas adjacent to the gallbladder. Findings are compatible with a perforated viscus. Possibility of a perforated ulcer is raised. No definite gastric wall thickening identified. No evidence of extravasated GI contrast material. Large and small bowel loops grossly normal appearance. Questionable wall thickening of the descending colon versus underdistension artifact  image 51.  Bladder, uterus and adnexae unremarkable. Redundant cecum located in right upper quadrant. Appendix not localized. No definite mass or adenopathy. Small amount of low attenuation free pelvic fluid. Umbilical hernia containing fat. 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 No additional osseous findings.  IMPRESSION: Extensive retroperitoneal gas with a significant portion of gas located near the duodenum, raising question of perforated ulcer at the second or third / retroperitoneal portions of the duodenum.  Additional gas is seen within the retroperitoneum in the mid abdomen and pelvis, favor dissecting caudally from perforated duodenal ulcer. Single tiny focus of free intraperitoneal air is also identified with question additional gas adjacent to the gallbladder consistent with intraperitoneal perforation as well.  Single questionable site of the colonic wall thickening at the distal descending colon versus underdistension artifact. Umbilical hernia containing fat. Tiny nonspecific low attenuation focus within liver. Arachnoid cyst sacral spinal canal.  Critical Value/emergent results were called by telephone at the time of interpretation on 10/07/2011 at 1018 hours to Dr. Wilson Singer, who verbally acknowledged these results.  Original Report Authenticated By: Burnetta Sabin, M.D.    Anti-infectives: Anti-infectives     Start     Dose/Rate Route Frequency Ordered Stop   10/08/11 1000   ertapenem (INVANZ) 1 g in sodium chloride 0.9 % 50 mL IVPB        1 g 100 mL/hr over 30 Minutes Intravenous Every 24 hours 10/07/11 1813     10/07/11 1100   ertapenem (INVANZ) 1 g in sodium chloride 0.9 % 50 mL IVPB        1 g 100 mL/hr over 30 Minutes Intravenous  Once 10/07/11 1050 10/07/11 1151          Assessment Principal Problem:  *Rectal Perforation due to fecal impaction s/p Hartmann procedure 5/16-stable overnight.  Findings and operative events discussed with her  ABL anemia    LOS: 1 day   Plan: OOB later today.  Continue IV abxs.   Mary Leach 10/08/2011

## 2011-10-08 NOTE — Progress Notes (Signed)
UR complete 

## 2011-10-08 NOTE — Consult Note (Signed)
WOC ostomy consult  Stoma type/location: RLQ, end colostomy Stomal assessment/size: 1 3/4" round budded stoma, visualized thru pouch POD 1 with very little output.  Output: minimal bloody output in pouch Ostomy pouching: 1pc.karaya in place from OR, supplies ordered 2 1/4" 2pc pouch Education provided: Pt very sleepy today with PCA meds in use.  Did review anatomy of ostomy creation and begin education on care of stoma. Friend at bedside during educational session. Pt. Lives at home with husband and grown son for support.  She was not able to visualize her abdomen today due to reports of pain when she looks down.  She did seem motivated for care of her ostomy.  Answered questions and left colostomy educational booklet at the bedside for her.   WOC will follow along with you [place], [place]

## 2011-10-09 LAB — CBC
HCT: 26.7 % — ABNORMAL LOW (ref 36.0–46.0)
Hemoglobin: 9 g/dL — ABNORMAL LOW (ref 12.0–15.0)
MCH: 29.3 pg (ref 26.0–34.0)
MCHC: 33.7 g/dL (ref 30.0–36.0)
MCV: 87 fL (ref 78.0–100.0)
Platelets: 195 10*3/uL (ref 150–400)
RBC: 3.07 MIL/uL — ABNORMAL LOW (ref 3.87–5.11)
RDW: 14.4 % (ref 11.5–15.5)
WBC: 4.1 10*3/uL (ref 4.0–10.5)

## 2011-10-09 LAB — BASIC METABOLIC PANEL
BUN: 5 mg/dL — ABNORMAL LOW (ref 6–23)
CO2: 24 mEq/L (ref 19–32)
Calcium: 8.1 mg/dL — ABNORMAL LOW (ref 8.4–10.5)
Chloride: 100 mEq/L (ref 96–112)
Creatinine, Ser: 0.54 mg/dL (ref 0.50–1.10)
GFR calc Af Amer: >90 mL/min (ref >90)
GFR calc non Af Amer: >90 mL/min (ref >90)
Glucose, Bld: 125 mg/dL — ABNORMAL HIGH (ref 70–99)
Potassium: 4.1 mEq/L (ref 3.5–5.1)
Sodium: 129 mEq/L — ABNORMAL LOW (ref 135–145)

## 2011-10-09 NOTE — Progress Notes (Signed)
Patient ID: Mary Leach, female   DOB: [birth year], 61 y.o.   MRN: [id number]  [facility] - Progress Note  POD# 2  Subjective: Patient comfortable.  No nausea or emesis.  Has not been OOB much.  Using PCA regularly.  Objective: Vital signs in last 24 hours: Temp:  [97.9 F (36.6 C)-100 F (37.8 C)] 99.4 F (37.4 C) (05/18 0600) Pulse Rate:  [80-100] 80  (05/18 0600) Resp:  [14-18] 18  (05/18 0600) BP: (101-122)/(60-73) 117/71 mmHg (05/18 0600) SpO2:  [96 %-99 %] 99 % (05/18 0600) FiO2 (%):  [39 %-41 %] 41 % (05/18 0412) Last BM Date: 10/06/11  Intake/Output from previous day: 05/17 0701 - 05/18 0700 In: 711.4 [P.O.:1; I.V.:710.4] Out: 3280 [Urine:2950; Emesis/NG output:150; Drains:180]  Exam: HEENT - clear, not icteric Neck - soft Chest - clear bilaterally, few rhonchi Cor - RRR, no murmur Abd - soft, few BS present; NG with small thin output; stoma viable; wound clean without drainage, fascia intact; drain with serous output Ext - no significant edema Neuro - grossly intact, no focal deficits  Lab Results:   Basename 10/09/11 0504 10/08/11 0423  WBC 4.1 6.6  HGB 9.0* 9.8*  HCT 26.7* 29.0*  PLT 195 222     Basename 10/09/11 0504 10/08/11 0423  NA 129* 129*  K 4.1 4.1  CL 100 98  CO2 24 23  GLUCOSE 125* 169*  BUN 5* 6  CREATININE 0.54 0.51  CALCIUM 8.1* 8.0*    Studies/Results: Dg Chest 2 View  10/07/2011  **ADDENDUM** CREATED: 10/07/2011 11:57:42  Bowel loops under the left diaphragm, difficult to exclude free intraperitoneal air. No free intraperitoneal air identified under right diaphragm. The patient has evidence of retroperitoneal and free intraperitoneal air on preceding CT chest, previously reported.  **END ADDENDUM** SIGNED BY: Elta Guadel[place] A. Thornton Papas, M.D.   10/07/2011  *RADIOLOGY REPORT*  Clinical Data:  Preoperative assessment for bleeding ulcer, history asthma, hypertension  CHEST - 2 VIEW  Comparison: 10/10/2008   Findings: Normal heart size and pulmonary vascularity. Elongation thoracic aorta. Mild peribronchial thickening and question minimal hyperaeration. No acute infiltrate, pleural effusion or pneumothorax. No acute osseous findings.  IMPRESSION: Mild bronchitic and question minimal hyperaeration. No acute abnormalities.  Original Report Authenticated By: Burnetta Sabin, M.D.   Ct Abdomen Pelvis W Contrast  10/07/2011  *RADIOLOGY REPORT*  Clinical Data: Abdominal and lower pelvic pain, hematochezia, abdominal distention, dry heaves  CT ABDOMEN AND PELVIS WITH CONTRAST  Technique:  Multidetector CT imaging of the abdomen and pelvis was performed following the standard protocol during bolus administration of intravenous contrast. Sagittal and coronal MPR images reconstructed from axial data set.  Contrast: 146m OMNIPAQUE IOHEXOL 300 MG/ML  SOLN Dilute oral contrast.  Comparison: None  Findings: Lungs clear. Nonspecific 11 x 5 mm diameter liver focus right lobe image 27. Remainder of liver, spleen, pancreas, kidneys, or adrenal glands. Retroperitoneal gas is identified in the upper abdomen, mid abdomen, and pelvis. Additional gas is seen in the mesentery. Single tiny focus of free intraperitoneal air in right upper quadrant with questionably additional gas adjacent to the gallbladder. Findings are compatible with a perforated viscus. Possibility of a perforated ulcer is raised. No definite gastric wall thickening identified. No evidence of extravasated GI contrast material. Large and small bowel loops grossly normal appearance. Questionable wall thickening of the descending colon versus underdistension artifact image 51.  Bladder, uterus and adnexae unremarkable. Redundant cecum located in right upper quadrant. Appendix  not localized. No definite mass or adenopathy. Small amount of low attenuation free pelvic fluid. Umbilical hernia containing fat. 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 No  additional osseous findings.  IMPRESSION: Extensive retroperitoneal gas with a significant portion of gas located near the duodenum, raising question of perforated ulcer at the second or third / retroperitoneal portions of the duodenum.  Additional gas is seen within the retroperitoneum in the mid abdomen and pelvis, favor dissecting caudally from perforated duodenal ulcer. Single tiny focus of free intraperitoneal air is also identified with question additional gas adjacent to the gallbladder consistent with intraperitoneal perforation as well.  Single questionable site of the colonic wall thickening at the distal descending colon versus underdistension artifact. Umbilical hernia containing fat. Tiny nonspecific low attenuation focus within liver. Arachnoid cyst sacral spinal canal.  Critical Value/emergent results were called by telephone at the time of interpretation on 10/07/2011 at 1018 hours to Dr. Wilson Singer, who verbally acknowledged these results.  Original Report Authenticated By: Burnetta Sabin, M.D.    Assessment / Plan: 1.  Status post ex lap with Hartmann's procedure for perforation of distal colon  - discontinue NG tube  - NPO except ice chips  - wound care BID  - OOB, ambulate in halls  - PCA for pain control 2.  Hyponatremia  - continue 0.9% NS IVF  - decrease IV rate 3.  Anemia secondary to blood loss  - monitor Hgb  Earnstine Regal, MD, [facility] Office: [phone]  10/09/2011

## 2011-10-10 NOTE — Progress Notes (Signed)
Patient encouraged to ambulate in hallway.  Patient agreed and patient assisted to hallway.  Patient took about 30 feet and then assisted back to bed,  Patient tolerated well.

## 2011-10-10 NOTE — Progress Notes (Signed)
Patient ID: Mary Leach, female   DOB: 1950/11/12, 61 y.o.   MRN: [id number]  [facility] - Progress Note  POD# 3  Subjective: Patient without complaint.  Less pain.  No nausea.  Anxious to start liquids.  Has not ambulated.  Objective: Vital signs in last 24 hours: Temp:  [98.6 F (37 C)-99.1 F (37.3 C)] 99.1 F (37.3 C) (05/19 0442) Pulse Rate:  [79-86] 79  (05/19 0442) Resp:  [14-21] 20  (05/19 0547) BP: (115-126)/(65-74) 126/66 mmHg (05/19 0442) SpO2:  [98 %-100 %] 100 % (05/19 0547) Last BM Date: 10/06/11  Intake/Output from previous day: 05/18 0701 - 05/19 0700 In: 1126.6 [I.V.:1124.6] Out: 3190 [Urine:3100; Emesis/NG output:20; Drains:70]  Exam: HEENT - clear, not icteric Neck - soft Chest - clear bilaterally Cor - RRR, no murmur Abd - mild distension; few BS; stoma viable but no output; dressing intact Ext - no significant edema Neuro - grossly intact, no focal deficits  Lab Results:   [facility] 10/09/11 0504 10/08/11 0423  WBC 4.1 6.6  HGB 9.0* 9.8*  HCT 26.7* 29.0*  PLT 195 222     Basename 10/09/11 0504 10/08/11 0423  NA 129* 129*  K 4.1 4.1  CL 100 98  CO2 24 23  GLUCOSE 125* 169*  BUN 5* 6  CREATININE 0.54 0.51  CALCIUM 8.1* 8.0*    Studies/Results: No results found.  Assessment / Plan: 1.  Status post Hartmann's procedure for perforation  - IV Invanz  - begin CL diet  - encourage OOB, ambulation  - wound care BID  Earnstine Regal, MD, [facility] Office: [phone]  10/10/2011

## 2011-10-11 ENCOUNTER — Encounter (HOSPITAL_COMMUNITY): Payer: Self-pay | Admitting: General Surgery

## 2011-10-11 NOTE — Progress Notes (Signed)
.   4 Days Post-Op  Subjective: She looks and feels better, up walking some.    Objective: Vital signs in last 24 hours: Temp:  [97.9 F (36.6 C)-98.9 F (37.2 C)] 98.9 F (37.2 C) (05/20 0500) Pulse Rate:  [78-90] 90  (05/20 0500) Resp:  [16-20] 20  (05/20 0752) BP: (121-129)/(64-71) 129/64 mmHg (05/20 0500) SpO2:  [94 %-100 %] 99 % (05/20 1000) Last BM Date: 10/10/11 (bloody liquid in bag)  Intake/Output from previous day: 05/19 0701 - 05/20 0700 In: 3582.1 [P.O.:420; I.V.:3102.1] Out: 3205 [Urine:3175; Drains:30] Intake/Output this shift: Total I/O In: 685 [P.O.:360; I.V.:300; Other:25] Out: 643 [Urine:925]  General appearance: alert, cooperative and no distress Resp: clear to auscultation bilaterally GI: soft, few BS, not much gas in colostomy, red-brown clear liquid in colostomy.  Tolerating clears. Wound is clean drain is serous.  Lab Results:   [facility] 10/09/11 0504  WBC 4.1  HGB 9.0*  HCT 26.7*  PLT 195    BMET  Basename 10/09/11 0504  NA 129*  K 4.1  CL 100  CO2 24  GLUCOSE 125*  BUN 5*  CREATININE 0.54  CALCIUM 8.1*   PT/INR No results found for this basename: LABPROT:2,INR:2 in the last 72 hours  No results found for this basename: AST:5,ALT:5,ALKPHOS:5,BILITOT:5,PROT:5,ALBUMIN:5 in the last 168 hours   Lipase  No results found for this basename: lipase     Studies/Results: No results found.  Medications:    . ertapenem (INVANZ) IV  1 g Intravenous Q24H  . heparin  5,000 Units Subcutaneous Q8H  . morphine   Intravenous Q4H    Assessment/Plan Perforated proximal rectum secondary to fecal impaction s/p Emergency exploratory laparotomy, resection of part of rectosigmoid colon, colostomy, fecal disimpaction.  10/07/11 Dr. Zella Richer. Asthma  ADD (attention deficit disorder)    Plan:  Stick to clears, needs more time before advancing diet.  Overall looks good.    LOS: 4 days    Mary Leach 10/11/2011

## 2011-10-11 NOTE — Progress Notes (Signed)
UR complete 

## 2011-10-11 NOTE — Progress Notes (Signed)
Improving.  Agree with PA note.

## 2011-10-12 MED ORDER — FLUTICASONE-SALMETEROL 100-50 MCG/DOSE IN AEPB
1.0000 | INHALATION_SPRAY | Freq: Two times a day (BID) | RESPIRATORY_TRACT | Status: DC
  Administered 2011-10-12 – 2011-10-20 (×15): 1 via RESPIRATORY_TRACT
  Filled 2011-10-12 (×2): qty 14

## 2011-10-12 MED ORDER — AZELASTINE HCL 0.1 % NA SOLN
1.0000 | Freq: Every day | NASAL | Status: DC
  Administered 2011-10-12 – 2011-10-19 (×8): 1 via NASAL
  Filled 2011-10-12: qty 30

## 2011-10-12 MED ORDER — MONTELUKAST SODIUM 10 MG PO TABS
10.0000 mg | ORAL_TABLET | Freq: Every day | ORAL | Status: DC
  Administered 2011-10-13 – 2011-10-19 (×8): 10 mg via ORAL
  Filled 2011-10-12 (×10): qty 1

## 2011-10-12 MED ORDER — FLUOXETINE HCL 20 MG PO CAPS
40.0000 mg | ORAL_CAPSULE | Freq: Every day | ORAL | Status: DC
  Administered 2011-10-13 – 2011-10-19 (×7): 40 mg via ORAL
  Filled 2011-10-12 (×8): qty 2

## 2011-10-12 MED ORDER — FLUTICASONE PROPIONATE 50 MCG/ACT NA SUSP
2.0000 | Freq: Every day | NASAL | Status: DC
  Administered 2011-10-12 – 2011-10-19 (×8): 2 via NASAL
  Filled 2011-10-12: qty 16

## 2011-10-12 MED ORDER — FLUOXETINE HCL 40 MG PO CAPS: 40.0000 mg | ORAL_CAPSULE | Freq: Every day | ORAL | Status: DC

## 2011-10-12 NOTE — Progress Notes (Signed)
5 Days Post-Op  Subjective: She is walking and having a little soft stool in her ostomy, she just got it changed so currently bag is empty, she said stool is soft. Still says she was not aware of constipation. Objective: Vital signs in last 24 hours: Temp:  [98.2 F (36.8 C)-99.7 F (37.6 C)] 99.7 F (37.6 C) (05/21 0605) Pulse Rate:  [79-97] 84  (05/21 0605) Resp:  [18-26] 20  (05/21 0801) BP: (116-142)/(72-83) 131/77 mmHg (05/21 0605) SpO2:  [96 %-100 %] 98 % (05/21 0801) Last BM Date: 10/11/11  35 ml thru drain, drain has been emptied so currently nothing to see.   Pt reports stool in ostomy, temp in 99. Range, Na still low  Intake/Output from previous day: 05/20 0701 - 05/21 0700 In: 2618.8 [P.O.:820; I.V.:1773.8] Out: 4510 [Urine:4475; Drains:35] Intake/Output this shift: Total I/O In: -  Out: 600 [Urine:600]  General appearance: alert, cooperative and no distress Resp: clear to auscultation bilaterally GI: tender, still using PCA proactively, no distension, wound looks good  Lab Results:  No results found for this basename: WBC:2,HGB:2,HCT:2,PLT:2 in the last 72 hours  BMET No results found for this basename: NA:2,K:2,CL:2,CO2:2,GLUCOSE:2,BUN:2,CREATININE:2,CALCIUM:2 in the last 72 hours PT/INR No results found for this basename: LABPROT:2,INR:2 in the last 72 hours  No results found for this basename: AST:5,ALT:5,ALKPHOS:5,BILITOT:5,PROT:5,ALBUMIN:5 in the last 168 hours   Lipase  No results found for this basename: lipase     Studies/Results: No results found.  Medications:    . ertapenem (INVANZ) IV  1 g Intravenous Q24H  . heparin  5,000 Units Subcutaneous Q8H  . morphine   Intravenous Q4H    Assessment/Plan Perforated proximal rectum secondary to fecal impaction s/p Emergency exploratory laparotomy, resection of part of rectosigmoid colon, colostomy, fecal disimpaction. 10/07/11 Dr. Zella Richer.  Asthma  ADD (attention deficit disorder)  Heparin  for DVT prophylaxis   Plan: I'd like to see something in ostomy myself before I advance diet.  Leave PCA for now and hopefully start moving toward Po pain meds in AM.     LOS: 5 days    Dhruv Christina 10/12/2011

## 2011-10-12 NOTE — Consult Note (Addendum)
WOC ostomy consult  Stoma type/location: Ostomy pouch leaking.  Colostomy to LLQ Stomal assessment/size: Stoma red and viable, above skin level, 1 1/2inches Peristomal assessment: Intact skin surrounding.Output  Ostomy pouching:/2pc Education provided: Demonstrated pouch application.  Pt would like husband to be present during next teaching session.  Asked appropriate questions and able to open and close to empty with velcro.  Discussed pouching routines.  Supplies ordered to bedside for staff use. Member of [place] team will follow for further teaching sessions.   Julien Girt, RN, MSN, [employer]  [phone]

## 2011-10-12 NOTE — Progress Notes (Signed)
Looks good.  Advance diet tomorrow.

## 2011-10-13 ENCOUNTER — Encounter (HOSPITAL_COMMUNITY): Payer: Self-pay

## 2011-10-13 MED ORDER — ACETAMINOPHEN 10 MG/ML IV SOLN
1000.0000 mg | Freq: Four times a day (QID) | INTRAVENOUS | Status: AC
  Administered 2011-10-13 – 2011-10-14 (×4): 1000 mg via INTRAVENOUS
  Filled 2011-10-13 (×5): qty 100

## 2011-10-13 NOTE — Progress Notes (Signed)
6 Days Post-Op  Subjective: Kind of depressed, doesn't see much progress, no gas and very little clear colored fluid in bag.  Objective: Vital signs in last 24 hours: Temp:  [98.1 F (36.7 C)-99.3 F (37.4 C)] 99.3 F (37.4 C) (05/22 0636) Pulse Rate:  [78-86] 86  (05/22 0636) Resp:  [15-23] 20  (05/22 1208) BP: (112-132)/(60-77) 132/60 mmHg (05/22 0636) SpO2:  [95 %-100 %] 100 % (05/22 1208) FiO2 (%):  [38 %-39 %] 39 % (05/22 0437) Last BM Date: 10/11/11  No stool recorded, tm 99.3, VSS, na still low H/H down some  Intake/Output from previous day: 05/21 0701 - 05/22 0700 In: 2455 [P.O.:600; I.V.:1855] Out: 5600 [Urine:5600] Intake/Output this shift: Total I/O In: 594.9 [P.O.:360; I.V.:234.9] Out: 550 [Urine:550]  General appearance: alert, cooperative and no distress Resp: clear to auscultation bilaterally GI: tender, wound is OK drain is clear, not much in ostomy.  Lab Results:  No results found for this basename: WBC:2,HGB:2,HCT:2,PLT:2 in the last 72 hours  BMET No results found for this basename: NA:2,K:2,CL:2,CO2:2,GLUCOSE:2,BUN:2,CREATININE:2,CALCIUM:2 in the last 72 hours PT/INR No results found for this basename: LABPROT:2,INR:2 in the last 72 hours  No results found for this basename: AST:5,ALT:5,ALKPHOS:5,BILITOT:5,PROT:5,ALBUMIN:5 in the last 168 hours   Lipase  No results found for this basename: lipase     Studies/Results: No results found.  Medications:    . azelastine  1 spray Each Nare Daily  . ertapenem (INVANZ) IV  1 g Intravenous Q24H  . FLUoxetine  40 mg Oral Daily  . fluticasone  2 spray Each Nare Daily  . Fluticasone-Salmeterol  1 puff Inhalation Q12H  . heparin  5,000 Units Subcutaneous Q8H  . montelukast  10 mg Oral QHS  . morphine   Intravenous Q4H  . DISCONTD: FLUoxetine  40 mg Oral Daily    Assessment/Plan Perforated proximal rectum secondary to fecal impaction s/p Emergency exploratory laparotomy, resection of part of  rectosigmoid colon, colostomy, fecal disimpaction. 10/07/11 Dr. Zella Richer.  Asthma  ADD (attention deficit disorder)  Heparin for DVT prophylaxis   Plan:  IV tylenol, continue clears  LOS: 6 days    Mary Leach 10/13/2011

## 2011-10-13 NOTE — Progress Notes (Signed)
Agree.  Mild ileus

## 2011-10-14 MED ORDER — LACTATED RINGERS IV SOLN: INTRAVENOUS | Status: DC

## 2011-10-14 MED ORDER — ACETAMINOPHEN 10 MG/ML IV SOLN
1000.0000 mg | Freq: Four times a day (QID) | INTRAVENOUS | Status: AC
  Administered 2011-10-14 – 2011-10-15 (×3): 1000 mg via INTRAVENOUS
  Filled 2011-10-14 (×3): qty 100

## 2011-10-14 MED ORDER — ACETAMINOPHEN 10 MG/ML IV SOLN
1000.0000 mg | Freq: Four times a day (QID) | INTRAVENOUS | Status: DC
  Filled 2011-10-14 (×3): qty 100

## 2011-10-14 NOTE — Progress Notes (Signed)
7 Days Post-Op  Subjective: No real change, still very little coming thru ostomy, no gas, just some clear yellow-brownish liquid.  Has allot of stress with illness and family issues.  Objective: Vital signs in last 24 hours: Temp:  [98.2 F (36.8 C)-98.8 F (37.1 C)] 98.6 F (37 C) (05/23 0630) Pulse Rate:  [72-76] 76  (05/23 0630) Resp:  [16-22] 21  (05/23 0630) BP: (117-134)/(63-79) 134/63 mmHg (05/23 0630) SpO2:  [96 %-100 %] 100 % (05/23 0630) FiO2 (%):  [31 %-33 %] 31 % (05/23 0418) Last BM Date: 10/11/11  40 ml thru drain, Tm 99.3, VSS, no labs  Intake/Output from previous day: 05/22 0701 - 05/23 0700 In: 2894 [P.O.:600; I.V.:1894; IV Piggyback:400] Out: 1610 [Urine:4600; Drains:40] Intake/Output this shift:    General appearance: alert, cooperative and no distress Resp: clear to auscultation bilaterally GI: still tender, few BS, just clear yellow-brown fluid thru colostomy, wound ok drain clear yellow fluid  Lab Results:  No results found for this basename: WBC:2,HGB:2,HCT:2,PLT:2 in the last 72 hours  BMET No results found for this basename: NA:2,K:2,CL:2,CO2:2,GLUCOSE:2,BUN:2,CREATININE:2,CALCIUM:2 in the last 72 hours PT/INR No results found for this basename: LABPROT:2,INR:2 in the last 72 hours  No results found for this basename: AST:5,ALT:5,ALKPHOS:5,BILITOT:5,PROT:5,ALBUMIN:5 in the last 168 hours   Lipase  No results found for this basename: lipase     Studies/Results: No results found.  Medications:    . acetaminophen  1,000 mg Intravenous Q6H  . azelastine  1 spray Each Nare Daily  . ertapenem (INVANZ) IV  1 g Intravenous Q24H  . FLUoxetine  40 mg Oral Daily  . fluticasone  2 spray Each Nare Daily  . Fluticasone-Salmeterol  1 puff Inhalation Q12H  . heparin  5,000 Units Subcutaneous Q8H  . montelukast  10 mg Oral QHS  . morphine   Intravenous Q4H    Assessment/Plan Perforated proximal rectum secondary to fecal impaction s/p Emergency  exploratory laparotomy, resection of part of rectosigmoid colon, colostomy, fecal disimpaction. 10/07/11 Dr. Zella Richer.  Asthma  ADD (attention deficit disorder)  Heparin for DVT prophylaxis   Plan: she is back on Prozac, continue antibiotics, tylenol IV, ambulate more. Full liquid diet.      LOS: 7 days    Braylon Grenda 10/14/2011

## 2011-10-14 NOTE — Progress Notes (Signed)
Keep on fulls.  Pull drain friday

## 2011-10-14 NOTE — Progress Notes (Signed)
UR complete 

## 2011-10-14 NOTE — Consult Note (Signed)
WOC ostomy follow  up Stoma type/location: Left lower quadrant colostomy.   Stomal assessment/size: Pale pink edematous stoma, raised above skin level, size 1 3/4 cm Peristomal assessment: Intact  Treatment options for stomal/peristomal skin: None Output:  Minimal output, clear yellow in color approx 25 cc.  No stool or flatus at present. Ostomy pouching: 2pc, however, patient prefers 1 piece pouching system.  Education provided: Pouch change procedure.  Discussed diet and troubleshooting.  Patient assisted with changing of pouch without difficulty.  Husband at bedside and actively involved as well. All questions answered, Colostomy education folder provided.  Placed on [place] outpatient program.  Supplies ordered for bedside nurse to use, one piece Kellie Simmering [phone].Marland Kitchen    Harriet Pho RN, BSN, WOC Nurse/ Thank-you [place], Therapist, sports, MSN, [employer]  [phone]

## 2011-10-15 ENCOUNTER — Inpatient Hospital Stay (HOSPITAL_COMMUNITY): Payer: 59

## 2011-10-15 LAB — COMPREHENSIVE METABOLIC PANEL
ALT: 45 U/L — ABNORMAL HIGH (ref 0–35)
AST: 55 U/L — ABNORMAL HIGH (ref 0–37)
Albumin: 2.3 g/dL — ABNORMAL LOW (ref 3.5–5.2)
Alkaline Phosphatase: 102 U/L (ref 39–117)
BUN: 4 mg/dL — ABNORMAL LOW (ref 6–23)
CO2: 24 mEq/L (ref 19–32)
Calcium: 8.2 mg/dL — ABNORMAL LOW (ref 8.4–10.5)
Chloride: 102 mEq/L (ref 96–112)
Creatinine, Ser: 0.49 mg/dL — ABNORMAL LOW (ref 0.50–1.10)
GFR calc Af Amer: >90 mL/min (ref >90)
GFR calc non Af Amer: >90 mL/min (ref >90)
Glucose, Bld: 123 mg/dL — ABNORMAL HIGH (ref 70–99)
Potassium: 3.9 mEq/L (ref 3.5–5.1)
Sodium: 135 mEq/L (ref 135–145)
Total Bilirubin: 0.3 mg/dL (ref 0.3–1.2)
Total Protein: 5.9 g/dL — ABNORMAL LOW (ref 6.0–8.3)

## 2011-10-15 LAB — CBC
HCT: 25.6 % — ABNORMAL LOW (ref 36.0–46.0)
Hemoglobin: 8.7 g/dL — ABNORMAL LOW (ref 12.0–15.0)
MCH: 29 pg (ref 26.0–34.0)
MCHC: 34 g/dL (ref 30.0–36.0)
MCV: 85.3 fL (ref 78.0–100.0)
Platelets: 420 10*3/uL — ABNORMAL HIGH (ref 150–400)
RBC: 3 MIL/uL — ABNORMAL LOW (ref 3.87–5.11)
RDW: 14 % (ref 11.5–15.5)
WBC: 4 10*3/uL (ref 4.0–10.5)

## 2011-10-15 LAB — MAGNESIUM: Magnesium: 1.9 mg/dL (ref 1.5–2.5)

## 2011-10-15 LAB — PREALBUMIN: Prealbumin: 6.8 mg/dL — ABNORMAL LOW (ref 17.0–34.0)

## 2011-10-15 MED ORDER — ACETAMINOPHEN 325 MG PO TABS: 650.0000 mg | ORAL_TABLET | ORAL | Status: DC | PRN

## 2011-10-15 MED ORDER — OXYCODONE-ACETAMINOPHEN 5-325 MG PO TABS
1.0000 | ORAL_TABLET | ORAL | Status: DC | PRN
  Administered 2011-10-16 – 2011-10-20 (×9): 2 via ORAL
  Administered 2011-10-20: 1 via ORAL
  Filled 2011-10-15 (×10): qty 2

## 2011-10-15 MED ORDER — POLYETHYLENE GLYCOL 3350 17 G PO PACK
17.0000 g | PACK | Freq: Every day | ORAL | Status: DC
  Administered 2011-10-15 – 2011-10-18 (×4): 17 g via ORAL
  Filled 2011-10-15 (×5): qty 1

## 2011-10-15 MED ORDER — FAMOTIDINE IN NACL 20-0.9 MG/50ML-% IV SOLN
20.0000 mg | Freq: Two times a day (BID) | INTRAVENOUS | Status: DC
  Administered 2011-10-15 – 2011-10-16 (×4): 20 mg via INTRAVENOUS
  Filled 2011-10-15 (×5): qty 50

## 2011-10-15 NOTE — Progress Notes (Signed)
8 Days Post-Op  Subjective: She seems to feel better, tolerating full liquids, some gas in ostomy, but otherwise still clear fluid. She says pain is somewhat better.  Objective: Vital signs in last 24 hours: Temp:  [97.5 F (36.4 C)-98.2 F (36.8 C)] 98 F (36.7 C) (05/24 0552) Pulse Rate:  [74-85] 81  (05/24 0552) Resp:  [18-22] 18  (05/24 0552) BP: (111-133)/(52-79) 133/79 mmHg (05/24 0552) SpO2:  [98 %-100 %] 100 % (05/24 0552) Last BM Date: 10/11/11  Drain 100 ml recorded, nothing thru colostomy recorded. Afebrile, VSS, labs OK H/H drifting down some, AST, ALT both up some.  Intake/Output from previous day: 05/23 0701 - 05/24 0700 In: 2611 [P.O.:360; I.V.:1901; IV Piggyback:350] Out: 9563 [Urine:5150; Drains:100] Intake/Output this shift:    General appearance: alert, cooperative and no distress Resp: clear to auscultation bilaterally GI: soft, less tender, open wound looks good.  few BS Drainage is clear serous up to 100 ml yesterday.  Lab Results:   [facility] 10/15/11 0423  WBC 4.0  HGB 8.7*  HCT 25.6*  PLT 420*    BMET  Basename 10/15/11 0423  NA 135  K 3.9  CL 102  CO2 24  GLUCOSE 123*  BUN 4*  CREATININE 0.49*  CALCIUM 8.2*   PT/INR No results found for this basename: LABPROT:2,INR:2 in the last 72 hours   Lab 10/15/11 0423  AST 55*  ALT 45*  ALKPHOS 102  BILITOT 0.3  PROT 5.9*  ALBUMIN 2.3*     Lipase  No results found for this basename: lipase     Studies/Results: No results found.  Medications:    . acetaminophen  1,000 mg Intravenous Q6H  . azelastine  1 spray Each Nare Daily  . ertapenem (INVANZ) IV  1 g Intravenous Q24H  . FLUoxetine  40 mg Oral Daily  . fluticasone  2 spray Each Nare Daily  . Fluticasone-Salmeterol  1 puff Inhalation Q12H  . heparin  5,000 Units Subcutaneous Q8H  . montelukast  10 mg Oral QHS  . morphine   Intravenous Q4H  . DISCONTD: acetaminophen  1,000 mg Intravenous Q6H     Assessment/Plan Perforated proximal rectum secondary to fecal impaction s/p Emergency exploratory laparotomy, resection of part of rectosigmoid colon, colostomy, fecal disimpaction. 10/07/11 Dr. Zella Richer.  Asthma  ADD (attention deficit disorder)  Heparin for DVT prophylaxis    Plan:  Flat plate abd pending, add perocet for pain try to get her off PCA soon. Film shows bowel distension, ?ileus vs obstruction.     LOS: 8 days    Mary Leach 10/15/2011

## 2011-10-15 NOTE — Progress Notes (Signed)
Flat plate shows gas in colon and SB.  Will try clears and miralax but probably has mild ileus or decreases baseline motility.  She is not vomiting.

## 2011-10-15 NOTE — Progress Notes (Signed)
Increase diet.  D/C JP. Check flat plate

## 2011-10-15 NOTE — Progress Notes (Signed)
INITIAL ADULT NUTRITION ASSESSMENT Date: 10/15/2011   Time: 4:52 PM Reason for Assessment: Poor intake   ASSESSMENT: Female 61 y.o.  Dx: Rectal Perforation  Food/Nutrition Related Hx: Pt reports PTA she was eating 3 meals/day, with good appetite, and following a low fat healthy diet with lots of fruits, vegetables, and only drinking water and tea, no sodas. Pt reports 15 pound intentional weight loss in the past year r/t eating healthier. Past records indicate a 19 pound weight loss in the past 2 months. RN states pt may be slightly confused. Pt denies any problems chewing/swallowing. Pt reports being under a lot of stress recently r/t pt's mother being hospitalized last month. Pt states she has not been on Ensure in the past. Pt denies any nausea. Pt admitted with rectal bleeding with concern for colorectal perforation or perforated ulcer. POD# 8 emergent exploratory laparotomy with resection of rectosigmoid colon, colostomy, and fecal disimpaction. Pt's diet was advanced to full liquids, however pt now NPO r/t DG of abdomen today showed possible ileus or obstruction. Nurse tech reports pt did not eat that much when she was on full liquid diet, mostly just wanted water. RN reports pt with no colostomy output today.   Hx:  Past Medical History  Diagnosis Date  . Asthma   . ADD (attention deficit disorder)   . RBBB (right bundle branch block)   . Allergy     RHINITIS  . History of colonic polyps    Related Meds:  Scheduled Meds:   . acetaminophen  1,000 mg Intravenous Q6H  . azelastine  1 spray Each Nare Daily  . ertapenem (INVANZ) IV  1 g Intravenous Q24H  . famotidine (PEPCID) IV  20 mg Intravenous Q12H  . FLUoxetine  40 mg Oral Daily  . fluticasone  2 spray Each Nare Daily  . Fluticasone-Salmeterol  1 puff Inhalation Q12H  . heparin  5,000 Units Subcutaneous Q8H  . montelukast  10 mg Oral QHS  . morphine   Intravenous Q4H   Continuous Infusions:   . dextrose 5 % and 0.9 % NaCl  with KCl 20 mEq/L 75 mL/hr at 10/15/11 0926  . lactated ringers    . lactated ringers     PRN Meds:.acetaminophen, diphenhydrAMINE, diphenhydrAMINE, lip balm, menthol-cetylpyridinium, naloxone, ondansetron (ZOFRAN) IV, ondansetron (ZOFRAN) IV, ondansetron, oxyCODONE-acetaminophen, sodium chloride  Ht: 5' 5"  (165.1 cm)  Wt: 125 lb (56.7 kg)  Ideal Wt: 125 lb  % Ideal Wt: 100  Wt Readings from Last 5 Encounters:  10/07/11 125 lb (56.7 kg)  10/07/11 125 lb (56.7 kg)  08/13/10 144 lb (65.318 kg)    Usual Wt: 140 lb % Usual Wt: 89  Body mass index is 20.80 kg/(m^2).  Labs:  CMP     Component Value Date/Time   NA 135 10/15/2011 0423   K 3.9 10/15/2011 0423   CL 102 10/15/2011 0423   CO2 24 10/15/2011 0423   GLUCOSE 123* 10/15/2011 0423   BUN 4* 10/15/2011 0423   CREATININE 0.49* 10/15/2011 0423   CALCIUM 8.2* 10/15/2011 0423   PROT 5.9* 10/15/2011 0423   ALBUMIN 2.3* 10/15/2011 0423   AST 55* 10/15/2011 0423   ALT 45* 10/15/2011 0423   ALKPHOS 102 10/15/2011 0423   BILITOT 0.3 10/15/2011 0423   GFRNONAA >90 10/15/2011 0423   GFRAA >90 10/15/2011 0423    Intake/Output Summary (Last 24 hours) at 10/15/11 1703 Last data filed at 10/15/11 1651  Gross per 24 hour  Intake   2998 ml  Output   6230 ml  Net  -3232 ml   Last BM - 10/11/11  Diet Order: NPO  IVF:    dextrose 5 % and 0.9 % NaCl with KCl 20 mEq/L Last Rate: 75 mL/hr at 10/15/11 0926  lactated ringers   lactated ringers     Estimated Nutritional Needs:   Kcal:1700-2000 Protein:70-85g Fluid:1.7-2L  NUTRITION DIAGNOSIS: -Inadequate oral intake (NI-2.1).  Status: Ongoing -Pt meets criteria for severe PCM of acute illness AEB 13% weight loss in the past 2 months with <50% energy intake for the past 5 days  RELATED TO:  ? Ileus or obstruction  AS EVIDENCE BY: DG of abdomen today, NPO  MONITORING/EVALUATION(Goals): Advance diet as tolerated to low fiber diet.   EDUCATION NEEDS: -Education needs addressed -  discussed low fiber diet for new colostomy. Handout of information provided.   INTERVENTION: Diet advancement per MD. Will monitor.   Dietitian #: [phone]  [place] Per approved criteria  -Severe malnutrition in the context of acute illness or injury    Glory Rosebush 10/15/2011, 4:52 PM

## 2011-10-16 MED ORDER — MORPHINE SULFATE 2 MG/ML IJ SOLN
2.0000 mg | INTRAMUSCULAR | Status: DC | PRN
  Administered 2011-10-16: 2 mg via INTRAVENOUS
  Filled 2011-10-16: qty 1

## 2011-10-16 MED ORDER — DOCUSATE SODIUM 100 MG PO CAPS
100.0000 mg | ORAL_CAPSULE | Freq: Two times a day (BID) | ORAL | Status: DC
  Administered 2011-10-16 – 2011-10-17 (×4): 100 mg via ORAL
  Administered 2011-10-18: 200 mg via ORAL
  Administered 2011-10-18 – 2011-10-19 (×3): 100 mg via ORAL
  Filled 2011-10-16 (×10): qty 1

## 2011-10-16 NOTE — Progress Notes (Signed)
9 Days Post-Op  Subjective: Sore yesterday, tol some clears, no n/v, hesitant to get up and move around, today is best she has felt  Objective: Vital signs in last 24 hours: Temp:  [98.6 F (37 C)-99.4 F (37.4 C)] 98.6 F (37 C) (05/25 0639) Pulse Rate:  [70-75] 74  (05/25 0639) Resp:  [16-32] 18  (05/25 0835) BP: (120-129)/(65-79) 129/79 mmHg (05/25 0639) SpO2:  [96 %-100 %] 98 % (05/25 0835) FiO2 (%):  [98 %] 98 % (05/24 2004) Last BM Date: 10/12/11  Intake/Output from previous day: 05/24 0701 - 05/25 0700 In: 2262 [P.O.:360; I.V.:1802; IV Piggyback:100] Out: 3975 [Urine:3975] Intake/Output this shift: Total I/O In: 480 [P.O.:480] Out: 550 [Urine:550]  General appearance: no distress Resp: clear to auscultation bilaterally Cardio: regular rate and rhythm GI: dressing dry, ostomy pink with air in bag, approp tender  Lab Results:   Basename 10/15/11 0423  WBC 4.0  HGB 8.7*  HCT 25.6*  PLT 420*   BMET  Basename 10/15/11 0423  NA 135  K 3.9  CL 102  CO2 24  GLUCOSE 123*  BUN 4*  CREATININE 0.49*  CALCIUM 8.2*   PT/INR No results found for this basename: LABPROT:2,INR:2 in the last 72 hours ABG No results found for this basename: PHART:2,PCO2:2,PO2:2,HCO3:2 in the last 72 hours  Studies/Results: Dg Abd Portable 1v  10/15/2011  *RADIOLOGY REPORT*  Clinical Data: Small bowel obstruction.  PORTABLE ABDOMEN - 1 VIEW  Comparison: CT from 10/07/2011  Findings: Portable supine view of the abdomen shows diffuse gaseous bowel distention.  There is contrast material in the ascending and transverse colon and apparently within the colostomy.  Gas distended bowel in the central abdomen does not contain contrast material.  This may be a small bowel or redundant sigmoid colon. Surgical drain overlies the anatomic pelvis.  IMPRESSION: Diffuse gaseous bowel distention with contrast material visible in the colon.  There is some distended gas filled loops in the central abdomen  which may be colon or small bowel.  Imaging features could be compatible with ileus or obstruction.  Serial plain film exam may prove helpful to further evaluate.  Original Report Authenticated By: ERIC A. MANSELL, M.D.    Anti-infectives: Anti-infectives     Start     Dose/Rate Route Frequency Ordered Stop   10/08/11 1000   ertapenem (INVANZ) 1 g in sodium chloride 0.9 % 50 mL IVPB        1 g 100 mL/hr over 30 Minutes Intravenous Every 24 hours 10/07/11 1813     10/07/11 1100   ertapenem (INVANZ) 1 g in sodium chloride 0.9 % 50 mL IVPB        1 g 100 mL/hr over 30 Minutes Intravenous  Once 10/07/11 1050 10/07/11 1151          Assessment/Plan: POD 9 s/p hartmanns 1. Po pain meds with iv backup 2. pulm toilet 3. Dc abx 4. Full liquids 5. subq heparin/ scds   LOS: 9 days    [facility] 10/16/2011

## 2011-10-17 MED ORDER — FAMOTIDINE 20 MG PO TABS
20.0000 mg | ORAL_TABLET | Freq: Two times a day (BID) | ORAL | Status: DC
  Administered 2011-10-17 – 2011-10-19 (×6): 20 mg via ORAL
  Filled 2011-10-17 (×8): qty 1

## 2011-10-17 NOTE — Progress Notes (Signed)
The patient is receiving Pepcid by the intravenous route.  Based on criteria approved by the Pharmacy and [place], the medication is being converted to the equivalent oral dose form.  These criteria include: -No Active GI bleeding -Able to tolerate diet of full liquids (or better) or tube feeding OR able to tolerate other medications by the oral or enteral route  If you have any questions about this conversion, please contact the Pharmacy Department (ext [phone]).  Thank you.  Kara Mead, [facility] 10/17/2011 9:27 AM

## 2011-10-17 NOTE — Progress Notes (Signed)
10 Days Post-Op  Subjective: Feels well, tolerating full liquids, no complaints  Objective: Vital signs in last 24 hours: Temp:  [97.6 F (36.4 C)-99.1 F (37.3 C)] 98.3 F (36.8 C) (05/26 0452) Pulse Rate:  [70-79] 70  (05/26 0452) Resp:  [18-20] 18  (05/26 0452) BP: (117-127)/(62-73) 127/73 mmHg (05/26 0452) SpO2:  [95 %-100 %] 98 % (05/26 0921) Last BM Date: 10/16/11  Intake/Output from previous day: 05/25 0701 - 05/26 0700 In: 1654.2 [P.O.:680; I.V.:874.2; IV Piggyback:100] Out: 2650 [Urine:2650] Intake/Output this shift: Total I/O In: -  Out: 1200 [Urine:1200]  General appearance: alert, cooperative and no distress GI: soft, no significant tenderness, ND, healthy granulation tissue, ostomy with gas but no stool  Lab Results:   Basename 10/15/11 0423  WBC 4.0  HGB 8.7*  HCT 25.6*  PLT 420*   BMET  Basename 10/15/11 0423  NA 135  K 3.9  CL 102  CO2 24  GLUCOSE 123*  BUN 4*  CREATININE 0.49*  CALCIUM 8.2*   PT/INR No results found for this basename: LABPROT:2,INR:2 in the last 72 hours ABG No results found for this basename: PHART:2,PCO2:2,PO2:2,HCO3:2 in the last 72 hours  Studies/Results: No results found.  Anti-infectives: Anti-infectives     Start     Dose/Rate Route Frequency Ordered Stop   10/08/11 1000   ertapenem (INVANZ) 1 g in sodium chloride 0.9 % 50 mL IVPB  Status:  Discontinued        1 g 100 mL/hr over 30 Minutes Intravenous Every 24 hours 10/07/11 1813 10/16/11 1106   10/07/11 1100   ertapenem (INVANZ) 1 g in sodium chloride 0.9 % 50 mL IVPB        1 g 100 mL/hr over 30 Minutes Intravenous  Once 10/07/11 1050 10/07/11 1151          Assessment/Plan: s/p Procedure(s) (LRB): EXPLORATORY LAPAROTOMY (N/A) Advance diet she looks good and feels well.  will heplock and try to advance diet. continue to mobilize  LOS: 10 days    [place], [place] 10/17/2011

## 2011-10-18 ENCOUNTER — Encounter (HOSPITAL_COMMUNITY): Payer: Self-pay | Admitting: General Surgery

## 2011-10-18 DIAGNOSIS — J45909 Unspecified asthma, uncomplicated: Secondary | ICD-10-CM | POA: Diagnosis present

## 2011-10-18 DIAGNOSIS — K9189 Other postprocedural complications and disorders of digestive system: Secondary | ICD-10-CM | POA: Diagnosis not present

## 2011-10-18 DIAGNOSIS — K912 Postsurgical malabsorption, not elsewhere classified: Secondary | ICD-10-CM

## 2011-10-18 DIAGNOSIS — K567 Ileus, unspecified: Secondary | ICD-10-CM

## 2011-10-18 HISTORY — DX: Other postprocedural complications and disorders of digestive system: K56.7

## 2011-10-18 HISTORY — DX: Postsurgical malabsorption, not elsewhere classified: K91.2

## 2011-10-18 MED ORDER — LORATADINE 10 MG PO TABS
10.0000 mg | ORAL_TABLET | Freq: Every day | ORAL | Status: DC
  Administered 2011-10-19: 10 mg via ORAL
  Filled 2011-10-18 (×3): qty 1

## 2011-10-18 NOTE — Progress Notes (Signed)
Nurse showed pt's husband how to do pt's wet to dry dressing changed.  Pt's husband stated, "I have seen this done several times and I believe I can handle this at home."  Pt's husband says he feels comfortable with doing pt's dressing changes when pt is d/c to home.

## 2011-10-18 NOTE — Progress Notes (Signed)
11 Days Post-Op  Subjective: Tolerated regular diet.   Objective: Vital signs in last 24 hours: Temp:  [98 F (36.7 C)-99.2 F (37.3 C)] 98.1 F (36.7 C) (05/27 1000) Pulse Rate:  [70-81] 81  (05/27 1000) Resp:  [18-20] 18  (05/27 1000) BP: (101-116)/(59-61) 101/59 mmHg (05/27 1000) SpO2:  [98 %-100 %] 98 % (05/27 1011) Last BM Date: 07/20/11  Intake/Output from previous day: 05/26 0701 - 05/27 0700 In: 920 [P.O.:420] Out: 3355 [Urine:3350; Stool:5] Intake/Output this shift: Total I/O In: -  Out: 400 [Urine:400]  General appearance: alert, cooperative and no distress GI: abdomen unchanged.  ostomy with gas, small amount of stool  Lab Results:  No results found for this basename: WBC:2,HGB:2,HCT:2,PLT:2 in the last 72 hours BMET No results found for this basename: NA:2,K:2,CL:2,CO2:2,GLUCOSE:2,BUN:2,CREATININE:2,CALCIUM:2 in the last 72 hours PT/INR No results found for this basename: LABPROT:2,INR:2 in the last 72 hours ABG No results found for this basename: PHART:2,PCO2:2,PO2:2,HCO3:2 in the last 72 hours  Studies/Results: No results found.  Anti-infectives: Anti-infectives     Start     Dose/Rate Route Frequency Ordered Stop   10/08/11 1000   ertapenem (INVANZ) 1 g in sodium chloride 0.9 % 50 mL IVPB  Status:  Discontinued        1 g 100 mL/hr over 30 Minutes Intravenous Every 24 hours 10/07/11 1813 10/16/11 1106   10/07/11 1100   ertapenem (INVANZ) 1 g in sodium chloride 0.9 % 50 mL IVPB        1 g 100 mL/hr over 30 Minutes Intravenous  Once 10/07/11 1050 10/07/11 1151          Assessment/Plan: s/p Procedure(s) (LRB): EXPLORATORY LAPAROTOMY (N/A) doing well with diet.  wound care teaching and if continues to tolerate diet and ostomy functioning, may be ready for discharge tomorrow. consider home health for ostomy and wound care instruction.  LOS: 11 days    Madilyn Hook DAVID 10/18/2011

## 2011-10-18 NOTE — Discharge Summary (Signed)
Physician Discharge Summary  Patient ID: ALDONA BRYNER MRN: [id number] DOB/AGE: [birth year] 60 y.o.  Admit date: 10/07/2011 Discharge date: 10/20/2011  Admission Diagnoses: 1. Abdominal pain with free air/visceral perforation.  2. History of asthma on chronic medication  3. History of adult ADD on medication.  4. History of right bundle branch block found an asthma workup. No history of chest pain, no cardiac history.  5.Asthma/Rhinitis   Discharge Diagnoses: Perforated proximal rectum secondary to fecal impaction POST OP ileus Principal Problem:  *Rectal Perforation due to fecal impaction s/p Hartmann procedure 5/16 Active Problems:  Ileus following gastrointestinal surgery  Malnutrition following gastrointestinal surgery  Asthma  ADD (attention deficit disorder with hyperactivity)   PROCEDURES: s/p Emergency exploratory laparotomy, resection of part of rectosigmoid colon, colostomy, fecal disimpaction. 10/07/11 Dr. Zella Richer.    Hospital Course: Patient is a 61 year old female who is normal in good health. She occasional has GI upset and when this occurs she usually has loose stools. Last night before going to bed she had a bowel movement which she said was denser and harder than normal it was accompanied by blood in the toilet. Sounds like she had at least a fair amount of blood enough to color the water in the toilet. She went to bed and was apparently doing well up until 3:30 in the morning. At that time she woke up laterally screaning with lower abdominal pain. Her husband said she complained initially of the left side but then it went to both right and the left side below the umbilicus. Pain was so bad she couldn't move. Another bowel movement in the bed which was bloody. She was brought to the ER by EMS. The pain is never completely resolve it seems to wax and wane, its improved some with medication in the ER. She continues to have pain is asking for more pain medicine. It hurts for  her to turn over. Rectal exam by the ER physician shows hard stools that he was unable to disimpact.   CT scan obtained in the emergency room as a single tiny focus free air intraperitoneal air in the right upper quadrant adjacent gallbladder with the possibility of a perforated ulcer. There is no definite gastric wall thickening identified no evidence of extravasated GI contrast or material large bowel loops were grossly normal there's a question of a thickened ascending colon versus and nondistended artifact. 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 WBC is normal sodium is 1:30 potassium is 3.0. We're asked to see in consultation.  She was seen by DR.Rosenbower and admitted and taken to the OR that afternoon.  She tolerated the procedure well and returned to the floor.  Her wound was left open and started on wet to dry dressing.  She was slow to open up with a post op ileus.  Prealbumin was slow and we considered TNA, but she was able to tolerate liquids. Her diet was slowly advanced as she had more drainage from her ostomy.    By 10/19/11 she was on a soft diet and we arranged discharge  Disposition: Final discharge disposition not confirmed   Medication List  As of 10/20/2011  7:32 AM   STOP taking these medications         calcium carbonate 1250 MG tablet      naproxen sodium 220 MG tablet  TAKE these medications         acetaminophen 325 MG tablet   Commonly known as: TYLENOL   Take 2 tablets (650 mg total) by mouth every 4 (four) hours as needed.      azelastine 137 MCG/SPRAY nasal spray   Commonly known as: ASTELIN   Place 1 spray into the nose daily. Use in each nostril as directed      cetirizine 10 MG tablet   Commonly known as: ZYRTEC   Take 10 mg by mouth daily.      DSS 100 MG Caps   Take 100 mg by mouth 2 (two) times daily.      EPINEPHrine 0.3  mg/0.3 mL Devi   Commonly known as: EPI-PEN   Inject 0.3 mg into the muscle once.      FLUoxetine 40 MG capsule   Commonly known as: PROZAC   Take 40 mg by mouth daily.      fluticasone 50 MCG/ACT nasal spray   Commonly known as: FLONASE   Place 2 sprays into the nose daily.      Fluticasone-Salmeterol 100-50 MCG/DOSE Aepb   Commonly known as: ADVAIR   Inhale 1 puff into the lungs every 12 (twelve) hours.      montelukast 10 MG tablet   Commonly known as: SINGULAIR   Take 10 mg by mouth at bedtime.      multivitamin with minerals tablet   Take 1 tablet by mouth daily.      oxyCODONE-acetaminophen 5-325 MG per tablet   Commonly known as: PERCOCET   Take 1-2 tablets by mouth every 4 (four) hours as needed.      polyethylene glycol packet   Commonly known as: MIRALAX / GLYCOLAX   Take 17 g by mouth 2 (two) times daily as needed.           Follow-up Information    Follow up with ROSENBOWER,TODD J, MD in 2 weeks.   Contact information:   [facility], [address] [place] [phone]       Follow up with Wyatt Haste, MD. Schedule an appointment as soon as possible for a visit in 2 weeks. (Call Dr. Mammie Russian so he can schedule you for follow up/)    Contact information:   [address] [place] [phone]       Please follow up. (Wet to dry dressing to open abdominal wound twice a day using normal Saline.  Call if you have a problem)          Signed: JENNINGS,WILLARD 10/20/2011, 7:32 AM  Agree with above.  Alphonsa Overall, MD, [facility] Pager: [phone] Office phone:  [phone]

## 2011-10-18 NOTE — Discharge Instructions (Signed)
[facility]      [facility] [phone]  OPEN ABDOMINAL SURGERY: POST OP INSTRUCTIONS  Always review your discharge instruction sheet given to you by the facility where your surgery was performed.  IF YOU HAVE DISABILITY OR FAMILY LEAVE FORMS, YOU MUST BRING THEM TO THE OFFICE FOR PROCESSING.  PLEASE DO NOT GIVE THEM TO YOUR DOCTOR.  1. A prescription for pain medication may be given to you upon discharge.  Take your pain medication as prescribed, if needed.  If narcotic pain medicine is not needed, then you may take acetaminophen (Tylenol) or ibuprofen (Advil) as needed. 2. Take your usually prescribed medications unless otherwise directed. 3. If you need a refill on your pain medication, please contact your pharmacy. They will contact our office to request authorization.  Prescriptions will not be filled after 5pm or on week-ends. 4. You should follow a light diet the first few days after arrival home, such as soup and crackers, pudding, etc.unless your doctor has advised otherwise. A high-fiber, low fat diet can be resumed as tolerated.   Be sure to include lots of fluids daily. Most patients will experience some swelling and bruising on the chest and neck area.  Ice packs will help.  Swelling and bruising can take several days to resolve 5. Most patients will experience some swelling and bruising in the area of the incision. Ice pack will help. Swelling and bruising can take several days to resolve..  6. It is common to experience some constipation if taking pain medication after surgery.  Increasing fluid intake and taking a stool softener will usually help or prevent this problem from occurring.  A mild laxative (Milk of Magnesia or Miralax) should be taken according to package directions if there are no bowel movements after 48 hours. 7.  You may have steri-strips (small skin tapes) in place directly over the incision.  These strips should be left on the skin for 7-10 days.  If your  surgeon used skin glue on the incision, you may shower in 24 hours.  The glue will flake off over the next 2-3 weeks.  Any sutures or staples will be removed at the office during your follow-up visit. You may find that a light gauze bandage over your incision may keep your staples from being rubbed or pulled. You may shower and replace the bandage daily. 8. ACTIVITIES:  You may resume regular (light) daily activities beginning the next day--such as daily self-care, walking, climbing stairs--gradually increasing activities as tolerated.  You may have sexual intercourse when it is comfortable.  Refrain from any heavy lifting or straining until approved by your doctor. a. You may drive when you no longer are taking prescription pain medication, you can comfortably wear a seatbelt, and you can safely maneuver your car and apply brakes b. Return to Work: ___________________________________ 9. You should see your doctor in the office for a follow-up appointment approximately two weeks after your surgery.  Make sure that you call for this appointment within a day or two after you arrive home to insure a convenient appointment time. OTHER INSTRUCTIONS:  _____________________________________________________________ _____________________________________________________________  WHEN TO CALL YOUR DOCTOR: 1. Fever over 101.0 2. Inability to urinate 3. Nausea and/or vomiting 4. Extreme swelling or bruising 5. Continued bleeding from incision. 6. Increased pain, redness, or drainage from the incision. 7. Difficulty swallowing or breathing 8. Muscle cramping or spasms. 9. Numbness or tingling in hands or feet or around lips.  The clinic staff is available to   answer your questions during regular business hours.  Please don't hesitate to call and ask to speak to one of the nurses if you have concerns.  For further questions, please visit [email or web address]   

## 2011-10-19 MED ORDER — POLYETHYLENE GLYCOL 3350 17 G PO PACK
17.0000 g | PACK | Freq: Two times a day (BID) | ORAL | Status: DC
  Administered 2011-10-19: 17 g via ORAL
  Filled 2011-10-19 (×3): qty 1

## 2011-10-19 NOTE — Consult Note (Signed)
WOC ostomy consult  Stoma type/location:  Colostomy stoma red and viable, above skin level. Stomal assessment/size: 2 inch Peristomal assessment: Intact skin surrounding. Output  50cc semi-formed stool in pouch. Ostomy pouching: 1pc.  Education provided:  Pt able to cut and apply pouch without assistance. And able to empty and reapply velcro.  Reviewed pouching routines and ordering supplies.  Denies further questions at this time.  Supplies ordered to bedside for use.   Julien Girt, RN, MSN, [employer]  [phone]

## 2011-10-19 NOTE — Progress Notes (Signed)
12 Days Post-Op  Subjective Complains she cannot eat, drink, or void without percocet.  She says it's to painful  She appears to do OK with percocet. She has stool, but not much in Ostomy. Constipation was the cause of her perforation    Objective: Vital signs in last 24 hours: Temp:  [98 F (36.7 C)-98.8 F (37.1 C)] 98 F (36.7 C) (05/28 4010) Pulse Rate:  [71-88] 79  (05/28 0608) Resp:  [18] 18  (05/28 0608) BP: (101-125)/(58-67) 125/67 mmHg (05/28 0608) SpO2:  [98 %-100 %] 100 % (05/28 2725) Last BM Date: 07/20/11  Afebrile, VSS, no labs, 1040 ml PO , 25 ml thru colostomy.  Intake/Output from previous day: 05/27 0701 - 05/28 0700 In: 1040 [P.O.:1040] Out: 2925 [Urine:2900; Stool:25] Intake/Output this shift:    General appearance: alert, cooperative and no distress Resp: clear to auscultation bilaterally GI: stool is now in ostomy bag, not much, don't know when it was changed +BS. Wound is healing nicely  Lab Results:  No results found for this basename: WBC:2,HGB:2,HCT:2,PLT:2 in the last 72 hours  BMET No results found for this basename: NA:2,K:2,CL:2,CO2:2,GLUCOSE:2,BUN:2,CREATININE:2,CALCIUM:2 in the last 72 hours PT/INR No results found for this basename: LABPROT:2,INR:2 in the last 72 hours   Lab 10/15/11 0423  AST 55*  ALT 45*  ALKPHOS 102  BILITOT 0.3  PROT 5.9*  ALBUMIN 2.3*     Lipase  No results found for this basename: lipase     Studies/Results: No results found.  Medications:    . azelastine  1 spray Each Nare Daily  . docusate sodium  100 mg Oral BID  . famotidine  20 mg Oral BID  . FLUoxetine  40 mg Oral Daily  . fluticasone  2 spray Each Nare Daily  . Fluticasone-Salmeterol  1 puff Inhalation Q12H  . heparin  5,000 Units Subcutaneous Q8H  . loratadine  10 mg Oral Daily  . montelukast  10 mg Oral QHS  . polyethylene glycol  17 g Oral Daily    Assessment/Plan Perforated proximal rectum secondary to fecal impaction  POST OP  ileus  Principal Problem:  *Rectal Perforation due to fecal impaction s/p Hartmann procedure 5/16   Active Problems:  Ileus following gastrointestinal surgery  Malnutrition following gastrointestinal surgery  Asthma  ADD (attention deficit disorder with hyperactivity)  Plan: Increase miralax to bid, continue colace, bid, I am not quite ready to send her home, with this pain issue and low colostomy output.  Check labs AM.   LOS: 12 days   JENNINGS,WILLARD 10/19/2011  Husband in room.  Patient has had trouble with colostomy, but seems to be turning the corner.  Alphonsa Overall, MD, [facility] Pager: [phone] Office phone:  [phone]

## 2011-10-20 LAB — COMPREHENSIVE METABOLIC PANEL
ALT: 26 U/L (ref 0–35)
AST: 29 U/L (ref 0–37)
Albumin: 2.6 g/dL — ABNORMAL LOW (ref 3.5–5.2)
Alkaline Phosphatase: 108 U/L (ref 39–117)
BUN: 7 mg/dL (ref 6–23)
CO2: 26 mEq/L (ref 19–32)
Calcium: 8.7 mg/dL (ref 8.4–10.5)
Chloride: 98 mEq/L (ref 96–112)
Creatinine, Ser: 0.53 mg/dL (ref 0.50–1.10)
GFR calc Af Amer: >90 mL/min (ref >90)
GFR calc non Af Amer: >90 mL/min (ref >90)
Glucose, Bld: 99 mg/dL (ref 70–99)
Potassium: 4.6 mEq/L (ref 3.5–5.1)
Sodium: 131 mEq/L — ABNORMAL LOW (ref 135–145)
Total Bilirubin: 0.2 mg/dL — ABNORMAL LOW (ref 0.3–1.2)
Total Protein: 6.3 g/dL (ref 6.0–8.3)

## 2011-10-20 LAB — CBC
HCT: 27.6 % — ABNORMAL LOW (ref 36.0–46.0)
Hemoglobin: 8.9 g/dL — ABNORMAL LOW (ref 12.0–15.0)
MCH: 27.9 pg (ref 26.0–34.0)
MCHC: 32.2 g/dL (ref 30.0–36.0)
MCV: 86.5 fL (ref 78.0–100.0)
Platelets: 514 10*3/uL — ABNORMAL HIGH (ref 150–400)
RBC: 3.19 MIL/uL — ABNORMAL LOW (ref 3.87–5.11)
RDW: 14.1 % (ref 11.5–15.5)
WBC: 4 10*3/uL (ref 4.0–10.5)

## 2011-10-20 MED ORDER — POLYETHYLENE GLYCOL 3350 17 G PO PACK: 17.0000 g | PACK | Freq: Two times a day (BID) | ORAL | Status: AC | PRN

## 2011-10-20 MED ORDER — OXYCODONE-ACETAMINOPHEN 5-325 MG PO TABS: 1.0000 | ORAL_TABLET | ORAL | Status: AC | PRN

## 2011-10-20 MED ORDER — DSS 100 MG PO CAPS: 100.0000 mg | ORAL_CAPSULE | Freq: Two times a day (BID) | ORAL | Status: AC

## 2011-10-20 MED ORDER — ACETAMINOPHEN 325 MG PO TABS: 650.0000 mg | ORAL_TABLET | ORAL | Status: DC | PRN

## 2011-10-20 NOTE — Progress Notes (Signed)
Pt discharged to home with husband provided discharge instructions and prescriptions along with handouts. Discussed ostomy care and dressing changes ppt comfortable with doing both. Pt verbalized understanding of discharge information. Pt stable. Pt transported by tech IV removed and documented. Levy Sjogren, RN

## 2011-10-20 NOTE — Progress Notes (Signed)
13 Days Post-Op  Subjective: Doing better with pain issues, ostomy is working and she is ready to go home.  Objective: Vital signs in last 24 hours: Temp:  [97 F (36.1 C)-98.4 F (36.9 C)] 98.4 F (36.9 C) (05/29 0557) Pulse Rate:  [68-75] 75  (05/29 0557) Resp:  [17-18] 18  (05/29 0557) BP: (96-125)/(60-72) 125/72 mmHg (05/29 0557) SpO2:  [97 %-100 %] 97 % (05/29 0557) FiO2 (%):  [21 %] 21 % (05/28 0905) Last BM Date: 10/19/11  Intake/Output from previous day: 05/28 0701 - 05/29 0700 In: 540 [P.O.:540] Out: 3180 [Urine:3150; Stool:30] Intake/Output this shift:    General appearance: alert, cooperative and no distress Resp: clear to auscultation bilaterally GI: soft, much less tender, wound is clean and she has function thru her Ostomy  Lab Results:   Basename 10/20/11 0345  WBC 4.0  HGB 8.9*  HCT 27.6*  PLT 514*    BMET  Basename 10/20/11 0345  NA 131*  K 4.6  CL 98  CO2 26  GLUCOSE 99  BUN 7  CREATININE 0.53  CALCIUM 8.7   PT/INR No results found for this basename: LABPROT:2,INR:2 in the last 72 hours   Lab 10/20/11 0345 10/15/11 0423  AST 29 55*  ALT 26 45*  ALKPHOS 108 102  BILITOT 0.2* 0.3  PROT 6.3 5.9*  ALBUMIN 2.6* 2.3*     Lipase  No results found for this basename: lipase     Studies/Results: No results found.  Medications:    . azelastine  1 spray Each Nare Daily  . docusate sodium  100 mg Oral BID  . famotidine  20 mg Oral BID  . FLUoxetine  40 mg Oral Daily  . fluticasone  2 spray Each Nare Daily  . Fluticasone-Salmeterol  1 puff Inhalation Q12H  . heparin  5,000 Units Subcutaneous Q8H  . loratadine  10 mg Oral Daily  . montelukast  10 mg Oral QHS  . polyethylene glycol  17 g Oral BID  . DISCONTD: polyethylene glycol  17 g Oral Daily    Assessment/Plan Perforated proximal rectum secondary to fecal impaction  POST OP ileus  Principal Problem:  *Rectal Perforation due to fecal impaction s/p Hartmann procedure 5/16    Active Problems:  Ileus following gastrointestinal surgery  Malnutrition following gastrointestinal surgery  Asthma  ADD (attention deficit disorder with hyperactivity)   Plan:  D/C home on miralax and colace, home meds, follow up our office and DR. Lalonde.    LOS: 13 days    JENNINGS,WILLARD 10/20/2011  Agree with above. But note, this did not show up in my box until December 2014!!??  Alphonsa Overall, MD, [facility] Pager: [phone] Office phone:  [phone]

## 2011-10-22 ENCOUNTER — Telehealth: Payer: Self-pay

## 2011-10-22 NOTE — Telephone Encounter (Signed)
PT CALLED TO SEE IF WE COULD GET HOME HEALTH OUT TO HELP WITH HER WOUND AND OSTOMY BAG THIS WEEKEND AND I CALLED [facility] THEY WERE 2 WEEKS OUT [facility] THEY WERE OVER A WEEK OUT AND [facility] THE SAME CALLED PT TO INFORM HER AND SHE SAID HE HUSBAND WOULD BE THERE TO HELP AND SHE WOULD SEE [place] ON MONDAY

## 2011-10-25 ENCOUNTER — Ambulatory Visit (INDEPENDENT_AMBULATORY_CARE_PROVIDER_SITE_OTHER): Payer: 59 | Admitting: Family Medicine

## 2011-10-25 ENCOUNTER — Encounter: Payer: Self-pay | Admitting: Family Medicine

## 2011-10-25 VITALS — BP 116/80 | HR 81 | Wt 119.0 lb

## 2011-10-25 DIAGNOSIS — K5900 Constipation, unspecified: Secondary | ICD-10-CM

## 2011-10-25 DIAGNOSIS — R198 Other specified symptoms and signs involving the digestive system and abdomen: Secondary | ICD-10-CM

## 2011-10-25 DIAGNOSIS — Z933 Colostomy status: Secondary | ICD-10-CM

## 2011-10-25 DIAGNOSIS — R69 Illness, unspecified: Secondary | ICD-10-CM

## 2011-10-25 NOTE — Patient Instructions (Signed)
Start drink plenty of fluids. Take the MiraLax and stool softeners. If you have trouble, call me.

## 2011-10-25 NOTE — Progress Notes (Signed)
  Subjective:    Patient ID: Mary Leach, female    DOB: [birth year], 61 y.o.   MRN: [id number]  HPI She is here for consult after recent hospitalization and surgery for perforated viscus. She now has a colostomy present. She did have a colonoscopy done within the recent past which was apparently normal. There is question as to why she became constipated. Further discussion with her indicates that several weeks prior to the surgery she had been spending a lot of time helping to take care of her mother who was in and out of the hospital and in an extended care facility for treatment of the lower leg hematoma. She admits to not taking care of herself during that timeframe. She does not remember having difficulty with constipation however review of the hospital record indicates that she most definitely was constipated. He was sent home on MiraLax and a stool softener however she has not started them.   Review of Systems     Objective:   Physical Exam Alert and in no distress. Colostomy is intact and draining slightly. The abdominal incision appears normal. Abdomen is soft and nontender.      Assessment & Plan:   1. Perforated viscus   2. Colostomy status   3. Constipation    after discussion with her and is clear that she spent more time taking care of her mother neglected her own basic biological needs. Strongly encouraged her to have better balance in her life. Also encouraged her to start taking the MiraLax and stool softener. If she has difficulty with abdominal pain or concern over the colostomy, she will call. 30 minutes spent discussing this with her.

## 2011-10-26 ENCOUNTER — Telehealth (INDEPENDENT_AMBULATORY_CARE_PROVIDER_SITE_OTHER): Payer: Self-pay | Admitting: General Surgery

## 2011-10-26 NOTE — Telephone Encounter (Signed)
Patient called in stating she talked to her insurance [facility]) who advised they approved her having ostomy care. Patient would like for it to be arranged for a nurse/homecare to come to home once per week to make sure the site is being cared for properly and to provide her any instructions if needed. Patient advised she is using miralax and a softener, both twice per day. I advised that she wants to be careful not to overstimulate her system and may want (based on her meal consumption) to cut one of them down to once per day if needed. Patient confirmed she is eating solid food without any difficulty. I advised message would be forwarded to [facility] at her request.Confirmed patient has supplies and gave her an alternate number [phone]) in case the company she is currently using for supplies cannot help her for any reason.  Patient advised she can be called back on her cell [phone] and a message can be left.

## 2011-10-27 ENCOUNTER — Telehealth (INDEPENDENT_AMBULATORY_CARE_PROVIDER_SITE_OTHER): Payer: Self-pay

## 2011-10-27 NOTE — Telephone Encounter (Signed)
Referred the pt to [facility].  She will call and make an appt with Marice Potter, RN.

## 2011-11-08 ENCOUNTER — Encounter: Payer: Self-pay | Admitting: Family Medicine

## 2011-11-08 ENCOUNTER — Ambulatory Visit (INDEPENDENT_AMBULATORY_CARE_PROVIDER_SITE_OTHER): Payer: 59 | Admitting: Family Medicine

## 2011-11-08 VITALS — BP 110/70 | HR 72 | Wt 119.0 lb

## 2011-11-08 DIAGNOSIS — K6289 Other specified diseases of anus and rectum: Secondary | ICD-10-CM

## 2011-11-08 DIAGNOSIS — K631 Perforation of intestine (nontraumatic): Secondary | ICD-10-CM

## 2011-11-08 DIAGNOSIS — Z79899 Other long term (current) drug therapy: Secondary | ICD-10-CM

## 2011-11-08 DIAGNOSIS — K912 Postsurgical malabsorption, not elsewhere classified: Secondary | ICD-10-CM

## 2011-11-08 DIAGNOSIS — Z818 Family history of other mental and behavioral disorders: Secondary | ICD-10-CM

## 2011-11-08 LAB — CBC WITH DIFFERENTIAL/PLATELET
Basophils Absolute: 0 10*3/uL (ref 0.0–0.1)
Basophils Relative: 0 % (ref 0–1)
Eosinophils Absolute: 0.1 10*3/uL (ref 0.0–0.7)
Eosinophils Relative: 2 % (ref 0–5)
HCT: 32.1 % — ABNORMAL LOW (ref 36.0–46.0)
Hemoglobin: 10.4 g/dL — ABNORMAL LOW (ref 12.0–15.0)
Lymphocytes Relative: 18 % (ref 12–46)
Lymphs Abs: 0.6 10*3/uL — ABNORMAL LOW (ref 0.7–4.0)
MCH: 28.1 pg (ref 26.0–34.0)
MCHC: 32.4 g/dL (ref 30.0–36.0)
MCV: 86.8 fL (ref 78.0–100.0)
Monocytes Absolute: 0.3 10*3/uL (ref 0.1–1.0)
Monocytes Relative: 9 % (ref 3–12)
Neutro Abs: 2.3 10*3/uL (ref 1.7–7.7)
Neutrophils Relative %: 71 % (ref 43–77)
Platelets: 354 10*3/uL (ref 150–400)
RBC: 3.7 MIL/uL — ABNORMAL LOW (ref 3.87–5.11)
RDW: 15 % (ref 11.5–15.5)
WBC: 3.2 10*3/uL — ABNORMAL LOW (ref 4.0–10.5)

## 2011-11-08 LAB — COMPREHENSIVE METABOLIC PANEL
ALT: 24 U/L (ref 0–35)
AST: 26 U/L (ref 0–37)
Albumin: 3.8 g/dL (ref 3.5–5.2)
Alkaline Phosphatase: 96 U/L (ref 39–117)
BUN: 11 mg/dL (ref 6–23)
CO2: 27 mEq/L (ref 19–32)
Calcium: 8.9 mg/dL (ref 8.4–10.5)
Chloride: 101 mEq/L (ref 96–112)
Creat: 0.49 mg/dL — ABNORMAL LOW (ref 0.50–1.10)
Glucose, Bld: 93 mg/dL (ref 70–99)
Potassium: 4.3 mEq/L (ref 3.5–5.3)
Sodium: 134 mEq/L — ABNORMAL LOW (ref 135–145)
Total Bilirubin: 0.3 mg/dL (ref 0.3–1.2)
Total Protein: 6.5 g/dL (ref 6.0–8.3)

## 2011-11-08 NOTE — Progress Notes (Signed)
  Subjective:    Patient ID: Mary Leach, female    DOB: 1950-07-20, 61 y.o.   MRN: [id number]  HPI She is here for followup visit. She has done quite nicely. She is eating regularly but usually a softer diet with low residue. Her pouch is working well. On her last visit she did have some abnormalities on her blood work which will need to be repeated. We also talked about the fact that her son is still living at home and has made very little effort to move on with his life. This has been an issue since he graduated from college. Her husband is usually the one who is resistant to get him to move.   Review of Systems     Objective:   Physical Exam Alert and in no distress otherwise not examined       Assessment & Plan:   1. Rectal Perforation due to fecal impaction s/p Hartmann procedure 5/16  CBC with Differential, Comprehensive metabolic panel  2. Malnutrition following gastrointestinal surgery  CBC with Differential, Comprehensive metabolic panel  3. Encounter for long-term (current) use of other medications  CBC with Differential, Comprehensive metabolic panel  4. Family history of stress     we discussed the family issue. I encouraged her to go ahead and for seizure with her husband even to the point of having a family meeting with me if needed. She is well aware of the fact that they are enabling there son to not grow by allowing him to live at home and not move on with his life.

## 2011-11-09 ENCOUNTER — Telehealth (INDEPENDENT_AMBULATORY_CARE_PROVIDER_SITE_OTHER): Payer: Self-pay | Admitting: General Surgery

## 2011-11-09 NOTE — Telephone Encounter (Signed)
Pt calling to ask if OK to travel to [place]; has new grandchild!  Advised OK as long as she wears seatbelt 100% of the time.  Recommended stopping to walk and stretch after 100 miles.  Pt states she will not be driving, but is not taking pain meds anyway.  She understands.

## 2011-11-11 ENCOUNTER — Telehealth (INDEPENDENT_AMBULATORY_CARE_PROVIDER_SITE_OTHER): Payer: Self-pay | Admitting: General Surgery

## 2011-11-11 NOTE — Telephone Encounter (Signed)
Pt called in to report that her stoma today is slightly enlarged, warm and the top and Rt side is hard.  Denies fever or change in bag output.  Paged Dr. Zella Richer for advice:  Stated this is normal stoma evolution, but make sure the appliance is not too tight around the stoma.  Updated pt with Dr. Bertrum Sol response and reminded her of appt next week on Tuesday for her postop recheck.

## 2011-11-16 ENCOUNTER — Encounter (INDEPENDENT_AMBULATORY_CARE_PROVIDER_SITE_OTHER): Payer: Self-pay | Admitting: General Surgery

## 2011-11-16 ENCOUNTER — Ambulatory Visit (INDEPENDENT_AMBULATORY_CARE_PROVIDER_SITE_OTHER): Payer: 59 | Admitting: General Surgery

## 2011-11-16 VITALS — BP 120/70 | HR 72 | Temp 98.0°F | Resp 18 | Ht 64.0 in | Wt 124.0 lb

## 2011-11-16 DIAGNOSIS — Z9889 Other specified postprocedural states: Secondary | ICD-10-CM

## 2011-11-16 NOTE — Patient Instructions (Signed)
Stay hydrated.  High fiber diet as discussed.  Use stool softeners and Miralax as needed.

## 2011-11-16 NOTE — Progress Notes (Signed)
Operation:  Emergency exploratory laparotomy, resection of part of rectosigmoid colon, colostomy, disimpaction  Date:  Oct 07, 2011  Pathology:  No neoplasms or malignancy  HPI:  She is here for her first postoperative visit. She is doing fairly well overall. Her wound is healing in by secondary intention and her family during the dressing changes. She is managing the colostomy fairly well. She still has some episodes where she has some firm stool per the colostomy.   Physical Exam: Mary Leach looks well and is in no acute distress.  Abdomen-midline incision is clean with good granulation tissue present. Left-sided colostomy looks good.  Assessment:  Status post emergency partial colectomy for perforation the rectosigmoid colon secondary to fecal impaction from severe constipation-she is doing well.  Plan:  Continue wound care. High fiber diet. Stay well hydrated. Return visit one month.  Eventually, she may need a colonic transit study.

## 2011-11-19 ENCOUNTER — Other Ambulatory Visit: Payer: Self-pay | Admitting: Obstetrics and Gynecology

## 2011-11-19 DIAGNOSIS — Z1231 Encounter for screening mammogram for malignant neoplasm of breast: Secondary | ICD-10-CM

## 2011-12-03 ENCOUNTER — Telehealth: Payer: Self-pay | Admitting: Family Medicine

## 2011-12-03 ENCOUNTER — Telehealth: Payer: Self-pay | Admitting: Gastroenterology

## 2011-12-03 NOTE — Telephone Encounter (Signed)
On call note @ 1815. Pt calls today with abd pain/bloating and little ostomy output. Symptoms started today. She had colostomy and rectosigmoid resection in May for a perforation related to a fecal impaction. Advise to take her temperature now and if 100.5 or above go directly to ED for evaluation. If no fever but symptoms do not completely resolve in next 2 hours go directly to ED for evaluation. Clear liquids only for now.

## 2011-12-03 NOTE — Telephone Encounter (Signed)
Use Prilosec

## 2011-12-06 NOTE — Telephone Encounter (Signed)
Pt states she already solved problem

## 2011-12-10 ENCOUNTER — Ambulatory Visit
Admission: RE | Admit: 2011-12-10 | Discharge: 2011-12-10 | Disposition: A | Discharge status: Home / Self Care | Payer: 59 | Source: Ambulatory Visit | Attending: Obstetrics and Gynecology | Admitting: Obstetrics and Gynecology

## 2011-12-10 DIAGNOSIS — Z1231 Encounter for screening mammogram for malignant neoplasm of breast: Secondary | ICD-10-CM

## 2011-12-15 ENCOUNTER — Ambulatory Visit (INDEPENDENT_AMBULATORY_CARE_PROVIDER_SITE_OTHER): Payer: 59 | Admitting: General Surgery

## 2011-12-15 ENCOUNTER — Encounter (INDEPENDENT_AMBULATORY_CARE_PROVIDER_SITE_OTHER): Payer: Self-pay | Admitting: General Surgery

## 2011-12-15 VITALS — BP 116/68 | HR 72 | Temp 97.1°F | Resp 16 | Ht 64.75 in | Wt 122.0 lb

## 2011-12-15 DIAGNOSIS — Z9889 Other specified postprocedural states: Secondary | ICD-10-CM

## 2011-12-15 NOTE — Progress Notes (Signed)
Operation:  Emergency exploratory laparotomy, resection of part of rectosigmoid colon, colostomy, disimpaction  Date:  Oct 07, 2011  Pathology:  No neoplasms or malignancy  HPI:  She is here for her second postoperative visit. She is doing fairly well overall. Her wound is healing in by secondary intention. She is managing the colostomy  well. She reports soft  stool per the colostomy.  She is having some problems with endurance.  She states a gluten-free diet makes her feel better overall and keeps her stools soft.  She is do for endoscopy per Dr. Collene Mares late next month.   Physical Exam: Mary Leach looks well and is in no acute distress.  Abdomen-midline incision is clean and almost completely healed. Left-sided colostomy looks good.  Assessment:  Status post emergency partial colectomy for perforation of the rectosigmoid colon secondary to fecal impaction from severe constipation-she is slowly improving.  Plan:  Continue wound care.  Stay with a gluten-free diet.  Start doing light exercise.  Colostomy closure when her energy level has improved and wound has completely healed.   Will do a colonic transit study and barium enema prior to colostomy closure.

## 2011-12-15 NOTE — Patient Instructions (Signed)
Try to start exercising as discussed.

## 2012-01-19 ENCOUNTER — Encounter: Payer: Self-pay | Admitting: Internal Medicine

## 2012-01-19 HISTORY — PX: UPPER GASTROINTESTINAL ENDOSCOPY: SHX188

## 2012-01-26 ENCOUNTER — Other Ambulatory Visit (INDEPENDENT_AMBULATORY_CARE_PROVIDER_SITE_OTHER): Payer: Self-pay | Admitting: General Surgery

## 2012-01-26 ENCOUNTER — Ambulatory Visit (INDEPENDENT_AMBULATORY_CARE_PROVIDER_SITE_OTHER): Payer: 59 | Admitting: General Surgery

## 2012-01-26 ENCOUNTER — Encounter (INDEPENDENT_AMBULATORY_CARE_PROVIDER_SITE_OTHER): Payer: Self-pay | Admitting: General Surgery

## 2012-01-26 VITALS — BP 98/56 | HR 72 | Temp 97.6°F | Resp 12 | Ht 64.0 in | Wt 124.6 lb

## 2012-01-26 DIAGNOSIS — K631 Perforation of intestine (nontraumatic): Secondary | ICD-10-CM

## 2012-01-26 DIAGNOSIS — K6289 Other specified diseases of anus and rectum: Secondary | ICD-10-CM

## 2012-01-26 NOTE — Progress Notes (Signed)
Subjective:     Patient ID: Mary Leach, female   DOB: 05-Dec-1950, 61 y.o.   MRN: [id number]  HPI  She is here for another followup visits. She's been discovered to have celiac disease. She has been started on a gluten-free diet. Since that time, her stool coming to the colostomy has been softer. She is otherwise feeling fairly well.   Review of Systems  She feels some swelling around the colostomy when she sits up or stands up.     Objective:   Physical Exam Gen.-she looks well and is in no acute distress.  Abdomen-midline incision is nearly completely healed; there are 2 small areas of granulation tissue which I treated with silver nitrate. The left upper quadrant colostomy is pain. I do not feel an obvious parastomal hernia.    Assessment:     Wound is almost completely healed. She has a new diagnosis of celiac disease. I am still concerned about colonic inertia.    Plan:     Will order a barium study through the colostomy and rectum to see what this distances between the 2 and a much rectum she has left. I recommended she have a colonic transit study to see she has colonic inertia prior to doing her colostomy closure.  RTC 6 weeks.

## 2012-01-26 NOTE — Patient Instructions (Signed)
We will schedule your barium test and discuss the colon transit study with Dr. Collene Mares.

## 2012-01-27 ENCOUNTER — Ambulatory Visit (HOSPITAL_COMMUNITY)
Admission: RE | Admit: 2012-01-27 | Discharge: 2012-01-27 | Disposition: A | Discharge status: Home / Self Care | Payer: 59 | Source: Ambulatory Visit | Attending: General Surgery | Admitting: General Surgery

## 2012-01-27 DIAGNOSIS — K6289 Other specified diseases of anus and rectum: Secondary | ICD-10-CM | POA: Insufficient documentation

## 2012-01-27 DIAGNOSIS — K631 Perforation of intestine (nontraumatic): Secondary | ICD-10-CM

## 2012-02-02 ENCOUNTER — Encounter (INDEPENDENT_AMBULATORY_CARE_PROVIDER_SITE_OTHER): Payer: Self-pay | Admitting: General Surgery

## 2012-02-02 NOTE — Progress Notes (Signed)
Patient ID: Mary Leach, female   DOB: 1950-11-09, 61 y.o.   MRN: [id number] I reviewed the barium lower GI study and discussed it with her. It does show she still has a significant fecal load in her colon. She is due to see Dr. Collene Mares tomorrow to talk about the transit studies and get these set up.

## 2012-02-07 ENCOUNTER — Other Ambulatory Visit: Payer: Self-pay | Admitting: Gastroenterology

## 2012-02-07 ENCOUNTER — Ambulatory Visit
Admission: RE | Admit: 2012-02-07 | Discharge: 2012-02-07 | Disposition: A | Discharge status: Home / Self Care | Payer: 59 | Source: Ambulatory Visit | Attending: Gastroenterology | Admitting: Gastroenterology

## 2012-02-07 DIAGNOSIS — K5900 Constipation, unspecified: Secondary | ICD-10-CM

## 2012-02-08 ENCOUNTER — Encounter: Payer: Self-pay | Admitting: Family Medicine

## 2012-03-08 ENCOUNTER — Ambulatory Visit (INDEPENDENT_AMBULATORY_CARE_PROVIDER_SITE_OTHER): Payer: 59 | Admitting: General Surgery

## 2012-03-08 ENCOUNTER — Encounter (INDEPENDENT_AMBULATORY_CARE_PROVIDER_SITE_OTHER): Payer: Self-pay | Admitting: General Surgery

## 2012-03-08 VITALS — BP 110/72 | HR 68 | Temp 97.8°F | Resp 12 | Ht 64.75 in | Wt 121.0 lb

## 2012-03-08 DIAGNOSIS — Z933 Colostomy status: Secondary | ICD-10-CM

## 2012-03-08 NOTE — Progress Notes (Signed)
Patient ID: Mary Leach, female   DOB: 03/20/1951, 61 y.o.   MRN: [id number]  Chief Complaint  Patient presents with  . Routine Post Op    reck colon    HPI Mary Leach is a 60 y.o. female.   HPI  She is here for followup visit of her colostomy. She had a transit study done which shows that the tablets were in the expected areas. She states she is having soft bowel movements without taking laxatives ever since she's been on a gluten free diet. This is with her diagnosis of celiac disease. She is eager to get her colostomy reversal after the holidays.  Past Medical History  Diagnosis Date  . Asthma   . ADD (attention deficit disorder)   . RBBB (right bundle branch block)   . Allergy     RHINITIS  . History of colonic polyps   . Asthma 10/18/2011  . ADD (attention deficit disorder with hyperactivity) 10/18/2011  . Ileus following gastrointestinal surgery 10/18/2011  . Malnutrition following gastrointestinal surgery 10/18/2011    Past Surgical History  Procedure Date  . Tonsillectomy and adenoidectomy   . Cystectomy     LEFT FOOT, RIGHT HAND  . Colonoscopy w/ polypectomy   . Laparotomy 10/07/2011    Procedure: EXPLORATORY LAPAROTOMY;  Surgeon: Odis Hollingshead, MD;  Location: [facility];  Service: General;  Laterality: N/A;  hartmann procedure and creation of colostomy  . Upper gastrointestinal endoscopy 01/19/12  . Colon surgery     Family History  Problem Relation Age of Onset  . Heart disease Father   . Mental illness Paternal Aunt   . Cancer Maternal Grandmother     SKIN  . Hypertension Maternal Grandfather   . Heart disease Paternal Grandfather     Social History History  Substance Use Topics  . Smoking status: Never Smoker   . Smokeless tobacco: Never Used  . Alcohol Use: 3.5 oz/week    7 drink(s) per week    Allergies  Allergen Reactions  . Sulfonamide Derivatives Rash    Arms only.    Current Outpatient Prescriptions  Medication Sig Dispense Refill  .  Azelastine HCl (ASTEPRO) 0.15 % SOLN Place 205.5 mcg into the nose daily.      . B Complex-Biotin-FA (B-COMPLEX PO) Take 100 mg by mouth daily.      Marland Kitchen EPINEPHrine (EPI-PEN) 0.3 mg/0.3 mL DEVI Inject 0.3 mg into the muscle once.        . Ergocalciferol (VITAMIN D2 PO) Take 1.25 mg by mouth as directed.      Marland Kitchen FLUoxetine (PROZAC) 10 MG tablet 3 tablets daily.      . fluticasone (FLONASE) 50 MCG/ACT nasal spray Place 2 sprays into the nose daily.        . Fluticasone-Salmeterol (ADVAIR) 100-50 MCG/DOSE AEPB Inhale 1 puff into the lungs every 12 (twelve) hours.        Marland Kitchen levocetirizine (XYZAL) 5 MG tablet Take 5 mg by mouth every evening.      . montelukast (SINGULAIR) 10 MG tablet Take 10 mg by mouth at bedtime.        . Probiotic Product (PROBIOTIC DAILY PO) Take by mouth. metaflora IB        Review of Systems Review of Systems  Constitutional: Negative.   Respiratory: Negative.   Cardiovascular: Negative.   Gastrointestinal: Negative.     Blood pressure 110/72, pulse 68, temperature 97.8 F (36.6 C), temperature source Temporal, resp. rate 12, height 5' 4.75" (1.645  m), weight 121 lb (54.885 kg).  Physical Exam Physical Exam  Constitutional: No distress.       Thin female  HENT:  Head: Normocephalic and atraumatic.  Eyes: EOM are normal. No scleral icterus.  Neck: Neck supple.  Cardiovascular: Normal rate and regular rhythm.   Pulmonary/Chest: Effort normal and breath sounds normal.  Abdominal: Soft. She exhibits no mass. There is no tenderness.       Left-sided colostomy draining brown stool with little bit of bulging around it. Midline scar.  Musculoskeletal: She exhibits no edema.  Lymphadenopathy:    She has no cervical adenopathy.    Data Reviewed X-rays  Assessment    Colostomy following rectal perforation and Hartman procedure 5/13.   Plan    Laparoscopic assisted colostomy closure after the holidays.  I have explained the procedure and risks of colostomy  closure.  Risks include but are not limited to bleeding, infection, wound problems, anesthesia, anastomotic leak, need for another colostomy, injury to intraabominal organs (such as intestine, spleen, kidney, bladder, ureter, etc.), ileus, irregular bowel habits.  She seems to understand and agrees to proceed.       Stevenson Windmiller J 03/08/2012, 12:37 PM

## 2012-03-08 NOTE — Patient Instructions (Signed)
[facility]  ONE-DAY (1) PRE-OP HOME COLON PREP INSTRUCTIONS: ** MIRALAX / GATORADE PREP **  Fill the two prescriptions at a pharmacy of your choice.  You must follow the instructions below carefully.  If you have questions or problems, please call and speak to someone in the clinic department at our office:   [phone].  MIRALAX - GATORADE -- DULCOLAX TABS:   Fill the prescriptions for MIRALAX  (255 gm bottle)    In addition, purchase four (4) DULCOLAX TABLETS (no prescription required), and one 64 oz GATORADE.  (Do NOT purchase red Gatorade; any other flavor is acceptable).  ANITIBIOTICS:   There will be 2 different antibiotics.     Take both prescriptions THE AFTERNOON BEFORE your surgery, at the times written on the bottles.  INSTRUCTIONS: 1. Five days prior to your procedure do not eat nuts, popcorn, or fruit with seeds.  Stop all fiber supplements such as Metamucil, Citrucel, etc.  2. The day before your procedure: o 6:00am:  take (4) Dulcolax tablets.  You should remain on clear liquids for the entire day.   CLEAR LIQUIDS: clear bouillon, broth, jello (NOT RED), black coffee, tea, soda, etc o 10:00am:  add the bottle of MiraLax to the 64-oz bottle of Gatorade, and dissolve.  Begin drinking the Gatorade mixture until gone (8 oz every 15-30 minutes).  Continue clear liquids until midnight (or bedtime). o Take the antibiotics at the times instructed on the bottles.  3. The day of your procedure:   Do not eat or drink ANYTHING after midnight before your surgery.     If you take Heart or Blood Pressure medicine, ask the pre-op nurses about these during your preop appointment.   Further pre-operative instructions will be given to you from the hospital.   Expect to be contacted 5-7 days before your surgery.

## 2012-04-12 ENCOUNTER — Other Ambulatory Visit (INDEPENDENT_AMBULATORY_CARE_PROVIDER_SITE_OTHER): Payer: Self-pay | Admitting: General Surgery

## 2012-05-18 ENCOUNTER — Encounter (HOSPITAL_COMMUNITY): Payer: Self-pay | Admitting: Pharmacy Technician

## 2012-05-18 NOTE — Progress Notes (Signed)
Patient wanted me to let you know surgery is 05-29-2012 with dr Abbey Chatters laparoscopic ileostomy takedown.

## 2012-05-22 NOTE — Patient Instructions (Addendum)
20 Mary Leach  05/22/2012   Your procedure is scheduled on:  05/29/12  MONDAY  Report to [facility] at  0900     AM.  Call this number if you have problems the morning of surgery: [phone]     BRING INHALERS WITH YOU TO HOSPITAL  Remember: BOWEL PREP AS DIRECTED BY OFFICE  Do not eat food after Saturday night as directed by office for bowel prep.    CLEAR LIQUIDS ALL DAY Sunday,  Increase fluids, drink until midnight or bedtime,  THEN NOTHING AFTER MIDNIGHT Sunday NIGHT   Take these medicines the morning of surgery with A SIP OF WATER:   Prozac    Advair, Flonase        May take Zyrtec if needed   .  Contacts, dentures or partial plates can not be worn to surgery  Leave suitcase in the car. After surgery it may be brought to your room.  For patients admitted to the hospital, checkout time is 11:00 AM day of  discharge.             SPECIAL INSTRUCTIONS- SEE [place] PREPARING FOR SURGERY INSTRUCTION SHEET-     DO NOT WEAR JEWELRY, LOTIONS, POWDERS, OR PERFUMES.  WOMEN-- DO NOT SHAVE LEGS OR UNDERARMS FOR 12 HOURS BEFORE SHOWERS. MEN MAY SHAVE FACE.  Patients discharged the day of surgery will not be allowed to drive home. IF going home the day of surgery, you must have a driver and someone to stay with you for the first 24 hours  Name and phone number of your driver:    admission                                                                    Please read over the following fact sheets that you were given: MRSA Information, Incentive Spirometry Sheet, Blood Transfusion Sheet  Information                                                                                   [name]  PST [phone]  [phone]                 FAILURE TO FOLLOW THESE INSTRUCTIONS MAY RESULT IN  CANCELLATION   OF YOUR SURGERY                                                  Patient Signature _____________________________

## 2012-05-22 NOTE — Progress Notes (Signed)
Chest x ray, EKG 5/13 EPIC

## 2012-05-23 ENCOUNTER — Encounter (HOSPITAL_COMMUNITY)
Admission: RE | Admit: 2012-05-23 | Discharge: 2012-05-23 | Disposition: A | Discharge status: Home / Self Care | Payer: 59 | Source: Ambulatory Visit | Attending: General Surgery | Admitting: General Surgery

## 2012-05-23 ENCOUNTER — Encounter (HOSPITAL_COMMUNITY): Payer: Self-pay

## 2012-05-23 HISTORY — DX: Celiac disease: K90.0

## 2012-05-23 LAB — CBC WITH DIFFERENTIAL/PLATELET
Basophils Absolute: 0 10*3/uL (ref 0.0–0.1)
Basophils Relative: 1 % (ref 0–1)
Eosinophils Absolute: 0 10*3/uL (ref 0.0–0.7)
Eosinophils Relative: 1 % (ref 0–5)
HCT: 34.7 % — ABNORMAL LOW (ref 36.0–46.0)
Hemoglobin: 11.5 g/dL — ABNORMAL LOW (ref 12.0–15.0)
Lymphocytes Relative: 23 % (ref 12–46)
Lymphs Abs: 0.5 10*3/uL — ABNORMAL LOW (ref 0.7–4.0)
MCH: 28.5 pg (ref 26.0–34.0)
MCHC: 33.1 g/dL (ref 30.0–36.0)
MCV: 86.1 fL (ref 78.0–100.0)
Monocytes Absolute: 0.2 10*3/uL (ref 0.1–1.0)
Monocytes Relative: 9 % (ref 3–12)
Neutro Abs: 1.4 10*3/uL — ABNORMAL LOW (ref 1.7–7.7)
Neutrophils Relative %: 67 % (ref 43–77)
Platelets: 228 10*3/uL (ref 150–400)
RBC: 4.03 MIL/uL (ref 3.87–5.11)
RDW: 16.7 % — ABNORMAL HIGH (ref 11.5–15.5)
WBC: 2.1 10*3/uL — ABNORMAL LOW (ref 4.0–10.5)

## 2012-05-23 LAB — COMPREHENSIVE METABOLIC PANEL
ALT: 15 U/L (ref 0–35)
AST: 22 U/L (ref 0–37)
Albumin: 3.8 g/dL (ref 3.5–5.2)
Alkaline Phosphatase: 114 U/L (ref 39–117)
BUN: 12 mg/dL (ref 6–23)
CO2: 27 mEq/L (ref 19–32)
Calcium: 9.5 mg/dL (ref 8.4–10.5)
Chloride: 101 mEq/L (ref 96–112)
Creatinine, Ser: 0.51 mg/dL (ref 0.50–1.10)
GFR calc Af Amer: >90 mL/min (ref >90)
GFR calc non Af Amer: >90 mL/min (ref >90)
Glucose, Bld: 82 mg/dL (ref 70–99)
Potassium: 5 mEq/L (ref 3.5–5.1)
Sodium: 134 mEq/L — ABNORMAL LOW (ref 135–145)
Total Bilirubin: 0.3 mg/dL (ref 0.3–1.2)
Total Protein: 8.2 g/dL (ref 6.0–8.3)

## 2012-05-23 LAB — SURGICAL PCR SCREEN
MRSA, PCR: NEGATIVE
Staphylococcus aureus: NEGATIVE

## 2012-05-23 LAB — PROTIME-INR
INR: 1.05 (ref 0.00–1.49)
Prothrombin Time: 13.6 seconds (ref 11.6–15.2)

## 2012-05-23 NOTE — Progress Notes (Signed)
Spoke with Neysa Bonito at [facility] who will page Dr Abbey Chatters today to have him review CBC

## 2012-05-23 NOTE — Progress Notes (Signed)
Faxed request through Epic to Dr Abbey Chatters to review abnormal CBC

## 2012-05-25 ENCOUNTER — Telehealth (INDEPENDENT_AMBULATORY_CARE_PROVIDER_SITE_OTHER): Payer: Self-pay

## 2012-05-25 NOTE — Telephone Encounter (Signed)
Pt called with additional information prior to her 05/29/12 surgery.  She has been diagnosed with celiac disease and her nutritionist has requested no dairy and gluten free diet while she is in the hospital.  She has also requested a room on 5W, but I explained that would depend on availability and her condition after surgery.  She was very concerned about getting the correct diet at the hospital.

## 2012-05-28 MED ORDER — DEXTROSE 5 % IV SOLN
2.0000 g | INTRAVENOUS | Status: AC
  Administered 2012-05-29: 2 mg via INTRAVENOUS
  Filled 2012-05-28: qty 2

## 2012-05-29 ENCOUNTER — Inpatient Hospital Stay (HOSPITAL_COMMUNITY)
Admission: RE | Admit: 2012-05-29 | Discharge: 2012-06-05 | DRG: 336 | Disposition: A | Discharge status: Home / Self Care | Payer: 59 | Source: Ambulatory Visit | Attending: General Surgery | Admitting: General Surgery

## 2012-05-29 ENCOUNTER — Encounter (HOSPITAL_COMMUNITY): Payer: Self-pay | Admitting: Surgery

## 2012-05-29 ENCOUNTER — Encounter (HOSPITAL_COMMUNITY): Payer: Self-pay | Admitting: Anesthesiology

## 2012-05-29 ENCOUNTER — Encounter (HOSPITAL_COMMUNITY): Payer: Self-pay | Admitting: *Deleted

## 2012-05-29 ENCOUNTER — Ambulatory Visit (HOSPITAL_COMMUNITY): Payer: 59 | Admitting: Anesthesiology

## 2012-05-29 ENCOUNTER — Encounter (HOSPITAL_COMMUNITY)
Admission: RE | Disposition: A | Discharge status: Home / Self Care | Payer: Self-pay | Source: Ambulatory Visit | Attending: General Surgery

## 2012-05-29 DIAGNOSIS — J45909 Unspecified asthma, uncomplicated: Secondary | ICD-10-CM | POA: Diagnosis present

## 2012-05-29 DIAGNOSIS — I4510 Unspecified right bundle-branch block: Secondary | ICD-10-CM | POA: Diagnosis present

## 2012-05-29 DIAGNOSIS — Z79899 Other long term (current) drug therapy: Secondary | ICD-10-CM

## 2012-05-29 DIAGNOSIS — D62 Acute posthemorrhagic anemia: Secondary | ICD-10-CM | POA: Diagnosis not present

## 2012-05-29 DIAGNOSIS — Z933 Colostomy status: Secondary | ICD-10-CM

## 2012-05-29 DIAGNOSIS — Z433 Encounter for attention to colostomy: Principal | ICD-10-CM

## 2012-05-29 DIAGNOSIS — Z9049 Acquired absence of other specified parts of digestive tract: Secondary | ICD-10-CM

## 2012-05-29 DIAGNOSIS — F909 Attention-deficit hyperactivity disorder, unspecified type: Secondary | ICD-10-CM | POA: Diagnosis present

## 2012-05-29 DIAGNOSIS — K900 Celiac disease: Secondary | ICD-10-CM | POA: Diagnosis present

## 2012-05-29 DIAGNOSIS — K660 Peritoneal adhesions (postprocedural) (postinfection): Secondary | ICD-10-CM | POA: Diagnosis present

## 2012-05-29 HISTORY — PX: COLOSTOMY TAKEDOWN: SHX5258

## 2012-05-29 LAB — TYPE AND SCREEN
ABO/RH(D): O POS
Antibody Screen: NEGATIVE

## 2012-05-29 SURGERY — CLOSURE, COLOSTOMY, LAPAROSCOPIC
Anesthesia: General | Site: Abdomen | Wound class: Contaminated

## 2012-05-29 MED ORDER — HEPARIN SODIUM (PORCINE) 5000 UNIT/ML IJ SOLN
5000.0000 [IU] | Freq: Three times a day (TID) | INTRAMUSCULAR | Status: DC
  Administered 2012-05-29 – 2012-06-04 (×19): 5000 [IU] via SUBCUTANEOUS
  Filled 2012-05-29 (×23): qty 1

## 2012-05-29 MED ORDER — SODIUM CHLORIDE 0.9 % IJ SOLN: 9.0000 mL | INTRAMUSCULAR | Status: DC | PRN

## 2012-05-29 MED ORDER — ONDANSETRON HCL 4 MG/2ML IJ SOLN
INTRAMUSCULAR | Status: DC | PRN
  Administered 2012-05-29: 4 mg via INTRAVENOUS

## 2012-05-29 MED ORDER — ROCURONIUM BROMIDE 100 MG/10ML IV SOLN
INTRAVENOUS | Status: DC | PRN
  Administered 2012-05-29: 20 mg via INTRAVENOUS
  Administered 2012-05-29: 10 mg via INTRAVENOUS
  Administered 2012-05-29: 20 mg via INTRAVENOUS
  Administered 2012-05-29: 10 mg via INTRAVENOUS

## 2012-05-29 MED ORDER — MORPHINE SULFATE (PF) 1 MG/ML IV SOLN
INTRAVENOUS | Status: DC
  Administered 2012-05-29: 1 mg via INTRAVENOUS
  Administered 2012-05-30: 6 mg via INTRAVENOUS
  Administered 2012-05-30: 11 mg via INTRAVENOUS
  Administered 2012-05-30: 2 mg via INTRAVENOUS
  Administered 2012-05-30: 3 mg via INTRAVENOUS
  Administered 2012-05-30: 08:00:00 via INTRAVENOUS
  Administered 2012-05-30: 3 mg via INTRAVENOUS
  Administered 2012-05-31: 1 mg via INTRAVENOUS
  Administered 2012-05-31: 3 mg via INTRAVENOUS
  Administered 2012-05-31: 2 mg via INTRAVENOUS
  Administered 2012-05-31: 9 mg via INTRAVENOUS
  Administered 2012-05-31: 3 mg via INTRAVENOUS
  Administered 2012-05-31: 08:00:00 via INTRAVENOUS
  Administered 2012-06-01: 1 mg via INTRAVENOUS
  Administered 2012-06-01: 2 mg via INTRAVENOUS
  Administered 2012-06-01: 1 mg via INTRAVENOUS
  Administered 2012-06-01: 8 mg via INTRAVENOUS
  Administered 2012-06-01: 05:00:00 via INTRAVENOUS
  Administered 2012-06-02: 1 mg via INTRAVENOUS
  Filled 2012-05-29 (×3): qty 25

## 2012-05-29 MED ORDER — PROPOFOL 10 MG/ML IV BOLUS
INTRAVENOUS | Status: DC | PRN
  Administered 2012-05-29: 150 mg via INTRAVENOUS

## 2012-05-29 MED ORDER — PROMETHAZINE HCL 25 MG/ML IJ SOLN
INTRAMUSCULAR | Status: AC
  Filled 2012-05-29: qty 1

## 2012-05-29 MED ORDER — FLUTICASONE-SALMETEROL 100-50 MCG/DOSE IN AEPB
1.0000 | INHALATION_SPRAY | Freq: Two times a day (BID) | RESPIRATORY_TRACT | Status: DC
  Administered 2012-06-01 – 2012-06-03 (×3): 1 via RESPIRATORY_TRACT
  Filled 2012-05-29 (×2): qty 14

## 2012-05-29 MED ORDER — NEOSTIGMINE METHYLSULFATE 1 MG/ML IJ SOLN
INTRAMUSCULAR | Status: DC | PRN
  Administered 2012-05-29: 4 mg via INTRAVENOUS

## 2012-05-29 MED ORDER — DEXAMETHASONE SODIUM PHOSPHATE 10 MG/ML IJ SOLN
INTRAMUSCULAR | Status: DC | PRN
  Administered 2012-05-29: 10 mg via INTRAVENOUS

## 2012-05-29 MED ORDER — MOMETASONE FURO-FORMOTEROL FUM 100-5 MCG/ACT IN AERO
2.0000 | INHALATION_SPRAY | Freq: Two times a day (BID) | RESPIRATORY_TRACT | Status: DC
  Filled 2012-05-29: qty 8.8

## 2012-05-29 MED ORDER — PROMETHAZINE HCL 25 MG/ML IJ SOLN
6.2500 mg | INTRAMUSCULAR | Status: DC | PRN
  Administered 2012-05-29: 6.25 mg via INTRAVENOUS

## 2012-05-29 MED ORDER — HEPARIN SODIUM (PORCINE) 5000 UNIT/ML IJ SOLN
5000.0000 [IU] | Freq: Three times a day (TID) | INTRAMUSCULAR | Status: DC
  Filled 2012-05-29 (×3): qty 1

## 2012-05-29 MED ORDER — KETOROLAC TROMETHAMINE 30 MG/ML IJ SOLN
15.0000 mg | Freq: Once | INTRAMUSCULAR | Status: AC | PRN
  Administered 2012-05-29: 30 mg via INTRAVENOUS

## 2012-05-29 MED ORDER — ACETAMINOPHEN 10 MG/ML IV SOLN
INTRAVENOUS | Status: DC | PRN
  Administered 2012-05-29: 1000 mg via INTRAVENOUS

## 2012-05-29 MED ORDER — DEXTROSE 5 % IV SOLN
1.0000 g | Freq: Four times a day (QID) | INTRAVENOUS | Status: AC
  Administered 2012-05-29 – 2012-05-30 (×3): 1 g via INTRAVENOUS
  Filled 2012-05-29 (×4): qty 1

## 2012-05-29 MED ORDER — GLYCOPYRROLATE 0.2 MG/ML IJ SOLN
INTRAMUSCULAR | Status: DC | PRN
  Administered 2012-05-29: .8 mg via INTRAVENOUS
  Administered 2012-05-29: 0.2 mg via INTRAVENOUS

## 2012-05-29 MED ORDER — HYDROMORPHONE HCL PF 1 MG/ML IJ SOLN
0.2500 mg | INTRAMUSCULAR | Status: DC | PRN
  Administered 2012-05-29 (×2): 0.25 mg via INTRAVENOUS
  Administered 2012-05-29: 0.5 mg via INTRAVENOUS

## 2012-05-29 MED ORDER — LACTATED RINGERS IV SOLN
INTRAVENOUS | Status: DC | PRN
  Administered 2012-05-29: 1000 mL

## 2012-05-29 MED ORDER — ALVIMOPAN 12 MG PO CAPS
12.0000 mg | ORAL_CAPSULE | Freq: Once | ORAL | Status: AC
  Administered 2012-05-29: 12 mg via ORAL
  Filled 2012-05-29: qty 1

## 2012-05-29 MED ORDER — HYDROMORPHONE HCL PF 1 MG/ML IJ SOLN
INTRAMUSCULAR | Status: DC | PRN
  Administered 2012-05-29 (×4): 0.5 mg via INTRAVENOUS

## 2012-05-29 MED ORDER — MIDAZOLAM HCL 5 MG/5ML IJ SOLN
INTRAMUSCULAR | Status: DC | PRN
  Administered 2012-05-29: 2 mg via INTRAVENOUS

## 2012-05-29 MED ORDER — KCL-LACTATED RINGERS-D5W 20 MEQ/L IV SOLN
INTRAVENOUS | Status: DC
  Administered 2012-05-29 – 2012-05-31 (×4): via INTRAVENOUS
  Administered 2012-06-01: 30 mL via INTRAVENOUS
  Administered 2012-06-02 (×2): via INTRAVENOUS
  Filled 2012-05-29 (×9): qty 1000

## 2012-05-29 MED ORDER — MORPHINE SULFATE (PF) 1 MG/ML IV SOLN
INTRAVENOUS | Status: AC
  Administered 2012-05-29: 1 mg
  Filled 2012-05-29: qty 25

## 2012-05-29 MED ORDER — PNEUMOCOCCAL VAC POLYVALENT 25 MCG/0.5ML IJ INJ
0.5000 mL | INJECTION | INTRAMUSCULAR | Status: AC
  Administered 2012-05-30: 0.5 mL via INTRAMUSCULAR
  Filled 2012-05-29 (×2): qty 0.5

## 2012-05-29 MED ORDER — CEFOXITIN SODIUM-DEXTROSE 1-4 GM-% IV SOLR (PREMIX)
INTRAVENOUS | Status: AC
  Filled 2012-05-29: qty 100

## 2012-05-29 MED ORDER — LIDOCAINE HCL (CARDIAC) 20 MG/ML IV SOLN
INTRAVENOUS | Status: DC | PRN
  Administered 2012-05-29: 60 mg via INTRAVENOUS

## 2012-05-29 MED ORDER — NALOXONE HCL 0.4 MG/ML IJ SOLN: 0.4000 mg | INTRAMUSCULAR | Status: DC | PRN

## 2012-05-29 MED ORDER — 0.9 % SODIUM CHLORIDE (POUR BTL) OPTIME
TOPICAL | Status: DC | PRN
  Administered 2012-05-29 (×2): 1000 mL

## 2012-05-29 MED ORDER — LACTATED RINGERS IV SOLN
INTRAVENOUS | Status: DC
  Administered 2012-05-29: 1000 mL via INTRAVENOUS

## 2012-05-29 MED ORDER — FLUOXETINE HCL 20 MG PO TABS
30.0000 mg | ORAL_TABLET | Freq: Every morning | ORAL | Status: DC
  Administered 2012-05-30 – 2012-06-01 (×3): 30 mg via ORAL
  Filled 2012-05-29 (×4): qty 2

## 2012-05-29 MED ORDER — ACETAMINOPHEN 10 MG/ML IV SOLN
INTRAVENOUS | Status: AC
  Filled 2012-05-29: qty 100

## 2012-05-29 MED ORDER — ONDANSETRON HCL 4 MG/2ML IJ SOLN: 4.0000 mg | Freq: Four times a day (QID) | INTRAMUSCULAR | Status: DC | PRN

## 2012-05-29 MED ORDER — SUCCINYLCHOLINE CHLORIDE 20 MG/ML IJ SOLN
INTRAMUSCULAR | Status: DC | PRN
  Administered 2012-05-29: 80 mg via INTRAVENOUS

## 2012-05-29 MED ORDER — FLUTICASONE PROPIONATE 50 MCG/ACT NA SUSP
1.0000 | Freq: Two times a day (BID) | NASAL | Status: DC
  Administered 2012-05-30 – 2012-06-02 (×3): 1 via NASAL
  Filled 2012-05-29: qty 16

## 2012-05-29 MED ORDER — HYDROMORPHONE HCL PF 1 MG/ML IJ SOLN
INTRAMUSCULAR | Status: AC
  Filled 2012-05-29: qty 1

## 2012-05-29 MED ORDER — DIPHENHYDRAMINE HCL 12.5 MG/5ML PO ELIX: 12.5000 mg | ORAL_SOLUTION | Freq: Four times a day (QID) | ORAL | Status: DC | PRN

## 2012-05-29 MED ORDER — ONDANSETRON HCL 4 MG PO TABS: 4.0000 mg | ORAL_TABLET | Freq: Four times a day (QID) | ORAL | Status: DC | PRN

## 2012-05-29 MED ORDER — EPHEDRINE SULFATE 50 MG/ML IJ SOLN
INTRAMUSCULAR | Status: DC | PRN
  Administered 2012-05-29 (×3): 5 mg via INTRAVENOUS

## 2012-05-29 MED ORDER — FENTANYL CITRATE 0.05 MG/ML IJ SOLN
INTRAMUSCULAR | Status: DC | PRN
  Administered 2012-05-29: 50 ug via INTRAVENOUS
  Administered 2012-05-29: 150 ug via INTRAVENOUS
  Administered 2012-05-29: 50 ug via INTRAVENOUS

## 2012-05-29 MED ORDER — ALVIMOPAN 12 MG PO CAPS
12.0000 mg | ORAL_CAPSULE | Freq: Two times a day (BID) | ORAL | Status: DC
  Administered 2012-05-30 – 2012-06-04 (×12): 12 mg via ORAL
  Filled 2012-05-29 (×14): qty 1

## 2012-05-29 MED ORDER — BUPIVACAINE-EPINEPHRINE (PF) 0.5% -1:200000 IJ SOLN
INTRAMUSCULAR | Status: AC
  Filled 2012-05-29: qty 10

## 2012-05-29 MED ORDER — KETOROLAC TROMETHAMINE 30 MG/ML IJ SOLN
INTRAMUSCULAR | Status: AC
  Filled 2012-05-29: qty 1

## 2012-05-29 MED ORDER — DIPHENHYDRAMINE HCL 50 MG/ML IJ SOLN: 12.5000 mg | Freq: Four times a day (QID) | INTRAMUSCULAR | Status: DC | PRN

## 2012-05-29 SURGICAL SUPPLY — 80 items
APL SKNCLS STERI-STRIP NONHPOA (GAUZE/BANDAGES/DRESSINGS) ×1
APPLIER CLIP 5 13 M/L LIGAMAX5 (MISCELLANEOUS)
APPLIER CLIP ROT 10 11.4 M/L (STAPLE)
APR CLP MED LRG 11.4X10 (STAPLE)
APR CLP MED LRG 5 ANG JAW (MISCELLANEOUS)
BENZOIN TINCTURE PRP APPL 2/3 (GAUZE/BANDAGES/DRESSINGS) ×2 IMPLANT
BLADE EXTENDED COATED 6.5IN (ELECTRODE) IMPLANT
BLADE HEX COATED 2.75 (ELECTRODE) ×3 IMPLANT
BLADE SURG SZ10 CARB STEEL (BLADE) ×2 IMPLANT
CABLE HIGH FREQUENCY MONO STRZ (ELECTRODE) ×2 IMPLANT
CANISTER SUCTION 2500CC (MISCELLANEOUS) ×2 IMPLANT
CANNULA ENDOPATH XCEL 11M (ENDOMECHANICALS) IMPLANT
CELLS DAT CNTRL 66122 CELL SVR (MISCELLANEOUS) IMPLANT
CLIP APPLIE 5 13 M/L LIGAMAX5 (MISCELLANEOUS) IMPLANT
CLIP APPLIE ROT 10 11.4 M/L (STAPLE) IMPLANT
CLOTH BEACON ORANGE TIMEOUT ST (SAFETY) ×2 IMPLANT
CLSR STERI-STRIP ANTIMIC 1/2X4 (GAUZE/BANDAGES/DRESSINGS) ×1 IMPLANT
COVER MAYO STAND STRL (DRAPES) ×2 IMPLANT
DECANTER SPIKE VIAL GLASS SM (MISCELLANEOUS) ×2 IMPLANT
DISSECTOR BLUNT TIP ENDO 5MM (MISCELLANEOUS) ×1 IMPLANT
DRAIN CHANNEL 19F RND (DRAIN) ×1 IMPLANT
DRAPE LAPAROSCOPIC ABDOMINAL (DRAPES) ×2 IMPLANT
DRAPE LG THREE QUARTER DISP (DRAPES) ×1 IMPLANT
DRAPE WARM FLUID 44X44 (DRAPE) ×2 IMPLANT
DRSG PAD ABDOMINAL 8X10 ST (GAUZE/BANDAGES/DRESSINGS) ×1 IMPLANT
ELECT REM PT RETURN 9FT ADLT (ELECTROSURGICAL) ×2
ELECTRODE REM PT RTRN 9FT ADLT (ELECTROSURGICAL) ×1 IMPLANT
EVACUATOR SILICONE 100CC (DRAIN) ×1 IMPLANT
FILTER SMOKE EVAC LAPAROSHD (FILTER) IMPLANT
GLOVE BIOGEL PI IND STRL 7.0 (GLOVE) ×1 IMPLANT
GLOVE BIOGEL PI INDICATOR 7.0 (GLOVE) ×1
GLOVE ECLIPSE 8.0 STRL XLNG CF (GLOVE) ×5 IMPLANT
GLOVE INDICATOR 8.0 STRL GRN (GLOVE) ×4 IMPLANT
GOWN STRL NON-REIN LRG LVL3 (GOWN DISPOSABLE) ×2 IMPLANT
GOWN STRL REIN XL XLG (GOWN DISPOSABLE) ×7 IMPLANT
KIT BASIN OR (CUSTOM PROCEDURE TRAY) ×2 IMPLANT
LEGGING LITHOTOMY PAIR STRL (DRAPES) ×1 IMPLANT
LIGASURE IMPACT 36 18CM CVD LR (INSTRUMENTS) IMPLANT
NS IRRIG 1000ML POUR BTL (IV SOLUTION) ×4 IMPLANT
PACK GENERAL/GYN (CUSTOM PROCEDURE TRAY) ×1 IMPLANT
PENCIL BUTTON HOLSTER BLD 10FT (ELECTRODE) ×2 IMPLANT
RTRCTR WOUND ALEXIS 18CM MED (MISCELLANEOUS)
SCALPEL HARMONIC ACE (MISCELLANEOUS) IMPLANT
SCISSORS LAP 5X35 DISP (ENDOMECHANICALS) ×3 IMPLANT
SET IRRIG TUBING LAPAROSCOPIC (IRRIGATION / IRRIGATOR) ×2 IMPLANT
SLEEVE XCEL OPT CAN 5 100 (ENDOMECHANICALS) IMPLANT
SOLUTION ANTI FOG 6CC (MISCELLANEOUS) ×2 IMPLANT
SPONGE GAUZE 4X4 12PLY (GAUZE/BANDAGES/DRESSINGS) ×2 IMPLANT
SPONGE LAP 18X18 X RAY DECT (DISPOSABLE) ×4 IMPLANT
STAPLER CIRC CVD 29MM 37CM (STAPLE) ×4 IMPLANT
STAPLER PROXIMATE 75MM BLUE (STAPLE) ×1 IMPLANT
STAPLER VISISTAT 35W (STAPLE) ×2 IMPLANT
SUCTION POOLE TIP (SUCTIONS) ×2 IMPLANT
SUT ETHILON 3 0 PS 1 (SUTURE) ×1 IMPLANT
SUT PDS AB 1 CTX 36 (SUTURE) ×2 IMPLANT
SUT PDS AB 1 TP1 96 (SUTURE) IMPLANT
SUT PROLENE 2 0 BLUE (SUTURE) ×1 IMPLANT
SUT PROLENE 2 0 SH DA (SUTURE) IMPLANT
SUT SILK 2 0 (SUTURE) ×2
SUT SILK 2 0 SH CR/8 (SUTURE) ×2 IMPLANT
SUT SILK 2-0 18XBRD TIE 12 (SUTURE) ×1 IMPLANT
SUT SILK 3 0 (SUTURE) ×2
SUT SILK 3 0 SH CR/8 (SUTURE) ×3 IMPLANT
SUT SILK 3-0 18XBRD TIE 12 (SUTURE) ×1 IMPLANT
SUT VICRYL 2 0 18  UND BR (SUTURE) ×1
SUT VICRYL 2 0 18 UND BR (SUTURE) ×1 IMPLANT
SYS LAPSCP GELPORT 120MM (MISCELLANEOUS)
SYSTEM LAPSCP GELPORT 120MM (MISCELLANEOUS) IMPLANT
TAPE CLOTH SURG 4X10 WHT LF (GAUZE/BANDAGES/DRESSINGS) ×1 IMPLANT
TOWEL OR 17X26 10 PK STRL BLUE (TOWEL DISPOSABLE) ×4 IMPLANT
TRAY FOLEY CATH 14FRSI W/METER (CATHETERS) ×2 IMPLANT
TRAY LAP CHOLE (CUSTOM PROCEDURE TRAY) ×2 IMPLANT
TROCAR BLADELESS OPT 5 100 (ENDOMECHANICALS) IMPLANT
TROCAR BLADELESS OPT 5 75 (ENDOMECHANICALS) ×4 IMPLANT
TROCAR SLEEVE XCEL 5X75 (ENDOMECHANICALS) ×2 IMPLANT
TROCAR XCEL BLUNT TIP 100MML (ENDOMECHANICALS) ×1 IMPLANT
TROCAR XCEL NON-BLD 11X100MML (ENDOMECHANICALS) IMPLANT
TUBING INSUFFLATION 10FT LAP (TUBING) ×2 IMPLANT
YANKAUER SUCT BULB TIP 10FT TU (MISCELLANEOUS) ×2 IMPLANT
YANKAUER SUCT BULB TIP NO VENT (SUCTIONS) ×2 IMPLANT

## 2012-05-29 NOTE — Anesthesia Postprocedure Evaluation (Signed)
  Anesthesia Post-op Note  Patient: Mary Leach  Procedure(s) Performed: Procedure(s) (LRB): LAPAROSCOPIC COLOSTOMY TAKEDOWN (N/A)  Patient Location: PACU  Anesthesia Type: General  Level of Consciousness: awake and alert   Airway and Oxygen Therapy: Patient Spontanous Breathing  Post-op Pain: mild  Post-op Assessment: Post-op Vital signs reviewed, Patient's Cardiovascular Status Stable, Respiratory Function Stable, Patent Airway and No signs of Nausea or vomiting  Last Vitals:  Filed Vitals:   05/29/12 1420  BP: 155/90  Pulse: 100  Temp:   Resp: 15    Post-op Vital Signs: stable   Complications: No apparent anesthesia complications

## 2012-05-29 NOTE — Anesthesia Preprocedure Evaluation (Addendum)
Anesthesia Evaluation  Patient identified by MRN, date of birth, ID band Patient awake    Reviewed: Allergy & Precautions, H&P , NPO status , Patient's Chart, lab work & pertinent test results  History of Anesthesia Complications (+) AWARENESS UNDER ANESTHESIA  Airway Mallampati: II TM Distance: >3 FB Neck ROM: Full    Dental No notable dental hx.    Pulmonary asthma ,  breath sounds clear to auscultation  Pulmonary exam normal       Cardiovascular negative cardio ROS  Rhythm:Regular Rate:Normal     Neuro/Psych negative neurological ROS  negative psych ROS   GI/Hepatic negative GI ROS, Neg liver ROS,   Endo/Other  negative endocrine ROS  Renal/GU negative Renal ROS  negative genitourinary   Musculoskeletal negative musculoskeletal ROS (+)   Abdominal   Peds negative pediatric ROS (+)  Hematology negative hematology ROS (+)   Anesthesia Other Findings   Reproductive/Obstetrics negative OB ROS                          Anesthesia Physical Anesthesia Plan  ASA: II  Anesthesia Plan: General   Post-op Pain Management:    Induction: Intravenous  Airway Management Planned: Oral ETT  Additional Equipment:   Intra-op Plan:   Post-operative Plan: Extubation in OR  Informed Consent: I have reviewed the patients History and Physical, chart, labs and discussed the procedure including the risks, benefits and alternatives for the proposed anesthesia with the patient or authorized representative who has indicated his/her understanding and acceptance.   Dental advisory given  Plan Discussed with: CRNA and Surgeon  Anesthesia Plan Comments:         Anesthesia Quick Evaluation

## 2012-05-29 NOTE — H&P (Signed)
Mary Leach is an 62 y.o. female.   Chief Complaint:   Here for elective colostomy closure HPI: She is status post emergency resection of the right toe sigmoid colon due to a proximal rectal perforation from severe constipation May of 2013. She has recovered from this and now presents for laparoscopic possible open colostomy closure.  She has been diagnosed with celiac disease and maintains a gluten-free diet.  Past Medical History  Diagnosis Date  . Asthma   . ADD (attention deficit disorder)   . RBBB (right bundle branch block)   . Allergy     RHINITIS  . History of colonic polyps   . Asthma 10/18/2011  . ADD (attention deficit disorder with hyperactivity) 10/18/2011  . Ileus following gastrointestinal surgery 10/18/2011  . Malnutrition following gastrointestinal surgery 10/18/2011  . Celiac disease     Past Surgical History  Procedure Date  . Tonsillectomy and adenoidectomy   . Cystectomy     LEFT FOOT, RIGHT HAND  . Colonoscopy w/ polypectomy   . Laparotomy 10/07/2011    Procedure: EXPLORATORY LAPAROTOMY;  Surgeon: Odis Hollingshead, MD;  Location: [facility];  Service: General;  Laterality: N/A;  hartmann procedure and creation of colostomy  . Upper gastrointestinal endoscopy 01/19/12  . Colon surgery     Family History  Problem Relation Age of Onset  . Heart disease Father   . Mental illness Paternal Aunt   . Cancer Maternal Grandmother     SKIN  . Hypertension Maternal Grandfather   . Heart disease Paternal Grandfather    Social History:  reports that she has never smoked. She has never used smokeless tobacco. She reports that she drinks about 3.5 ounces of alcohol per week. She reports that she does not use illicit drugs.  Allergies:  Allergies  Allergen Reactions  . Sulfonamide Derivatives Rash    Arms only.    Medications Prior to Admission  Medication Sig Dispense Refill  . B Complex-Biotin-FA (B-COMPLEX PO) Take 100 mg by mouth daily.      Marland Kitchen Bioflavonoid  Products (C COMPLEX PO) Take 500 mg by mouth daily.      Marland Kitchen FLUoxetine (PROZAC) 10 MG tablet Take 30 mg by mouth every morning.       . fluticasone (FLONASE) 50 MCG/ACT nasal spray Place 2 sprays into the nose daily.       Marland Kitchen OVER THE COUNTER MEDICATION Take 1 capsule by mouth 2 (two) times daily. Algae omega 3 DHA(320)      . Probiotic Product (PROBIOTIC DAILY PO) Take by mouth. metaflora IB      . Vitamin D, Ergocalciferol, (DRISDOL) 50000 UNITS CAPS Take 50,000 Units by mouth every 14 (fourteen) days. On mondays      . Zinc 22.5 MG TABS Take 22.5 mg by mouth daily.      Marland Kitchen acetaminophen (TYLENOL) 500 MG tablet Take 1,000 mg by mouth every 6 (six) hours as needed. For pain      . cetirizine (ZYRTEC) 10 MG tablet Take 10 mg by mouth daily as needed. For cold or laxative      . EPINEPHrine (EPI-PEN) 0.3 mg/0.3 mL DEVI Inject 0.3 mg into the muscle once. For previous allergies, maybe gluten      . Fluticasone-Salmeterol (ADVAIR) 100-50 MCG/DOSE AEPB Inhale 1 puff into the lungs every 12 (twelve) hours.       . montelukast (SINGULAIR) 10 MG tablet Take 10 mg by mouth at bedtime.  No results found for this or any previous visit (from the past 48 hour(s)). No results found.  Review of Systems  Constitutional: Negative for fever and chills.  HENT: Negative for congestion and sore throat.   Gastrointestinal: Negative for nausea, vomiting, diarrhea and constipation.    Blood pressure 95/56, pulse 84, temperature 97.8 F (36.6 C), temperature source Oral, resp. rate 18, SpO2 100.00%. Physical Exam  Constitutional: She appears well-developed and well-nourished. No distress.  HENT:  Head: Normocephalic and atraumatic.  Eyes: No scleral icterus.  Cardiovascular: Normal rate and regular rhythm.   Respiratory: Effort normal and breath sounds normal.  GI: Soft. She exhibits no mass. There is no tenderness.       Midline scar is present. Left-sided colostomy is present.  Musculoskeletal: She  exhibits no edema.  Neurological: She is alert.  Skin: Skin is warm and dry.  Psychiatric: She has a normal mood and affect. Her behavior is normal.     Assessment/Plan Status post rectosigmoid resection and colostomy for proximal rectal perforation. She is ready for colostomy closure.  Plan:  Laparoscopic assisted colostomy closure. The procedure, risks, and aftercare been discussed with her preoperatively.  Daveah Varone J 05/29/2012, 10:45 AM

## 2012-05-29 NOTE — Transfer of Care (Signed)
Immediate Anesthesia Transfer of Care Note  Patient: Mary Leach  Procedure(s) Performed: Procedure(s) (LRB) with comments: LAPAROSCOPIC COLOSTOMY TAKEDOWN (N/A) - Laparoscopic Assisted Colostomy Closure  with proctoscopy  Patient Location: PACU  Anesthesia Type:General  Level of Consciousness: Awake, alert and cooperative  Airway & Oxygen Therapy: Patient Spontanous Breathing and Patient connected to face mask oxygen  Post-op Assessment: Report given to PACU RN, Post -op Vital signs reviewed and stable and Patient moving all extremities X 4  Post vital signs: stable and reviewwd  Complications: No apparent anesthesia complications

## 2012-05-29 NOTE — Op Note (Signed)
Operative Note  Mary Leach female 62 y.o. 05/29/2012  PREOPERATIVE DX:  Colostomy  POSTOPERATIVE DX:  Same  PROCEDURE:  Laparoscopic lyses of adhesions (1 hour) and segmental colectomy with colostomy closure         Surgeon: Odis Hollingshead   Assistants: Fanny Skates M.D.  Anesthesia: General endotracheal anesthesia  Indications: This is a 62 year old female who had a proximal rectal perforation in May of 2013 and underwent a rectosigmoid colectomy with colostomy. She has fully recovered from that and now presents for laparoscopic colostomy closure.    Procedure Detail:  She was seen in the holding area. She was brought to the operating room placed supine on the operating table and a general anesthetic was given. She was placed in the lithotomy position. The colostomy appliance was removed. The abdominal wall and perianal areas were sterilely prepped and draped. A Betadine soaked sponge was placed over the colostomy followed by Tegaderm.  A 5 mm incision was made in the right subcostal area. Using a 5 mm Optiview trocar and laparoscope I gained access into the peritoneal cavity. Inspection beneath the trocar demonstrated no evidence of bleeding or organ injury. Adhesions were noted between the omentum and anterior bowel wall in the midline area. A 5 mm trocar was placed in the right lower quadrant and using sharp dissection the adhesions between the omentum and anterior abdominal wall were divided.  A 5 mm trocar was then placed in the periumbilical region.  Another 5 mm trocar was placed in the left lower quadrant. I approached the pelvis and noted adhesions between the small bowel and left ovary and fallopian tube. These were separated sharply and bluntly. Dense adhesions were then noted between the small bowel and the rectal cuff which is identified. Using sharp dissection these adhesions were divided and the small bowel and rectal cuff were separated. This was fairly tedious. More  occasions between the small bowel and itself were divided as well as the small bowel and the lateral pelvic wall on the left side. Total time for adhesiolysis was approximately 1 hour.  Next, I approached the colostomy and an elliptical incision around it. Using sharp dissection and electrocautery I freed up the colostomy. There was significant length of the colon such that it stretched well into the pelvis without tension.   A size 29 EEA stapler was brought into the field and the Endo was placed through the colostomy into the left colon. I then resected a segment of the colon with the colostomy using the GIA stapler. I brought the end of the anvil out the antimesenteric side of the descending colon and used a pursestring suture of 2-0 Prolene to secure it there. This was then dropped back into the abdominal cavity.  I then inspected the small bowel where the adhesionolysis was done between the small bowel and rectal cuff. 2 serosal defects were identified and these were reapproximated with interrupted 3-0 silk sutures. No enterotomies were noted.  The fascia of the colostomy site was then closed with running #1 PDS suture. The sutures were not tied and a Hassan trocar was then inserted through that part of the incision.  Proctosigmoidoscopy was then performed and I could see the area of the rectum well away from the vagina. The handle of the 29 EEA stapler was then inserted through the anus. There is good clearance between the vagina and this area. A stapled end rectum to side descending colon (a Baker-type) was then performed. 2 intact doughnuts were retrieved.  The anastomosis was then placed under water and checked for air leak. No leak was noted. The anastomosis was patent, viable, and under no tension.  The abdominal cavity was copiously irrigated with saline solution the solution was evacuated.  The right lower quadrant 5 mm trocar was removed and a 19 Blake drain was placed through this incision.  Placement was in the pelvis. It was anchored to the skin with 3-0 nylon suture. [facility] trocar was removed and I tied down the 2 fascial closing stitches the colostomy site area. The closure was inspected laparoscopically and it was solid.   Four quadrant inspection done laparoscopically demonstrated no evidence of organ injury or bleeding.  The trocars were removed and the CO2 gas was released. The trocar sites skin incisions were closed with 4-0 Monocryl subcuticular stitches followed by Steri-Strips. A drain sponge was placed around the drain site the drain was hooked up to bulb suction. The previous colostomy site subcutaneous tissue was packed with moistened gauze followed by dry dressing.  She tolerated the procedure well without any apparent complications and was taken to the recovery room in satisfactory condition.  Estimated Blood Loss:  200 mL         Drains: JACKSON-PRATT (JP)  Blood Given: none          Specimens: Segment of colon with colostomy        Complications:  * No complications entered in OR log *         Disposition: PACU - hemodynamically stable.         Condition: stable

## 2012-05-29 NOTE — Preoperative (Signed)
Beta Blockers   Reason not to administer Beta Blockers:Not Applicable 

## 2012-05-30 ENCOUNTER — Encounter (HOSPITAL_COMMUNITY): Payer: Self-pay | Admitting: General Surgery

## 2012-05-30 DIAGNOSIS — Z933 Colostomy status: Secondary | ICD-10-CM

## 2012-05-30 LAB — CBC
HCT: 31.4 % — ABNORMAL LOW (ref 36.0–46.0)
Hemoglobin: 10.8 g/dL — ABNORMAL LOW (ref 12.0–15.0)
MCH: 29.3 pg (ref 26.0–34.0)
MCHC: 34.4 g/dL (ref 30.0–36.0)
MCV: 85.3 fL (ref 78.0–100.0)
Platelets: 195 10*3/uL (ref 150–400)
RBC: 3.68 MIL/uL — ABNORMAL LOW (ref 3.87–5.11)
RDW: 16 % — ABNORMAL HIGH (ref 11.5–15.5)
WBC: 9.2 10*3/uL (ref 4.0–10.5)

## 2012-05-30 LAB — BASIC METABOLIC PANEL
BUN: 8 mg/dL (ref 6–23)
CO2: 24 mEq/L (ref 19–32)
Calcium: 8.6 mg/dL (ref 8.4–10.5)
Chloride: 98 mEq/L (ref 96–112)
Creatinine, Ser: 0.47 mg/dL — ABNORMAL LOW (ref 0.50–1.10)
GFR calc Af Amer: >90 mL/min (ref >90)
GFR calc non Af Amer: >90 mL/min (ref >90)
Glucose, Bld: 192 mg/dL — ABNORMAL HIGH (ref 70–99)
Potassium: 4.1 mEq/L (ref 3.5–5.1)
Sodium: 130 mEq/L — ABNORMAL LOW (ref 135–145)

## 2012-05-30 NOTE — Progress Notes (Signed)
1 Day Post-Op  Subjective: Hungry.  Some pain around LUQ wound.  Objective: Vital signs in last 24 hours: Temp:  [97.5 F (36.4 C)-99 F (37.2 C)] 98.4 F (36.9 C) (01/07 0610) Pulse Rate:  [77-116] 79  (01/07 0610) Resp:  [10-18] 10  (01/07 0745) BP: (95-157)/(56-95) 103/61 mmHg (01/07 0610) SpO2:  [97 %-100 %] 98 % (01/07 0745) FiO2 (%):  [39 %-43 %] 43 % (01/07 0420) Weight:  [140 lb 14 oz (63.9 kg)] 140 lb 14 oz (63.9 kg) (01/06 1547)    Intake/Output from previous day: 01/06 0701 - 01/07 0700 In: 3706.7 [I.V.:3706.7] Out: 1780 [Urine:1640; Drains:110; Blood:30] Intake/Output this shift:    PE: General- In NAD  Lungs-bibasilar crackles Abdomen-soft, dressing dry, thin serosanguinous drain output  Lab Results:   Basename 05/30/12 0430  WBC 9.2  HGB 10.8*  HCT 31.4*  PLT 195   BMET  Basename 05/30/12 0430  NA 130*  K 4.1  CL 98  CO2 24  GLUCOSE 192*  BUN 8  CREATININE 0.47*  CALCIUM 8.6   PT/INR No results found for this basename: LABPROT:2,INR:2 in the last 72 hours Comprehensive Metabolic Panel:    Component Value Date/Time   NA 130* 05/30/2012 0430   K 4.1 05/30/2012 0430   CL 98 05/30/2012 0430   CO2 24 05/30/2012 0430   BUN 8 05/30/2012 0430   CREATININE 0.47* 05/30/2012 0430   CREATININE 0.49* 11/08/2011 1415   GLUCOSE 192* 05/30/2012 0430   CALCIUM 8.6 05/30/2012 0430   AST 22 05/23/2012 1120   ALT 15 05/23/2012 1120   ALKPHOS 114 05/23/2012 1120   BILITOT 0.3 05/23/2012 1120   PROT 8.2 05/23/2012 1120   ALBUMIN 3.8 05/23/2012 1120     Studies/Results: No results found.  Anti-infectives: Anti-infectives     Start     Dose/Rate Route Frequency Ordered Stop   05/29/12 2000   cefOXitin (MEFOXIN) 1 g in dextrose 5 % 50 mL IVPB        1 g 100 mL/hr over 30 Minutes Intravenous Every 6 hours 05/29/12 1549 05/30/12 1359   05/29/12 0600   cefOXitin (MEFOXIN) 2 g in dextrose 5 % 50 mL IVPB        2 g 100 mL/hr over 30 Minutes Intravenous On call to  O.R. 05/28/12 1606 05/29/12 1110          Assessment Principal Problem:  *Colostomy status s/p laparoscopic closure on 05/29/12 Active Problems:  Asthma-no wheezing.  ABL anemia    LOS: 1 day   Plan: OOB.   Clear liquids.  Needs a gluten free diet.   Khalidah Herbold J 05/30/2012

## 2012-05-31 LAB — CBC
HCT: 30.3 % — ABNORMAL LOW (ref 36.0–46.0)
Hemoglobin: 10.1 g/dL — ABNORMAL LOW (ref 12.0–15.0)
MCH: 29 pg (ref 26.0–34.0)
MCHC: 33.3 g/dL (ref 30.0–36.0)
MCV: 87.1 fL (ref 78.0–100.0)
Platelets: 196 10*3/uL (ref 150–400)
RBC: 3.48 MIL/uL — ABNORMAL LOW (ref 3.87–5.11)
RDW: 16.7 % — ABNORMAL HIGH (ref 11.5–15.5)
WBC: 5.9 10*3/uL (ref 4.0–10.5)

## 2012-05-31 NOTE — Progress Notes (Signed)
Nutrition Brief Note  Met with pt to discuss gluten free, dairy free, and natural sugar options on the menu per pt request. Provided written list of food items allowed within these restrictions on our menu and notified kitchen staff of pt's special dietary needs. Assisted pt with ordering meals. Pt appreciative. No further nutrition intervention indicated at this time.   Levon Hedger MS, RD, LDN [phone] Pager [phone] After Hours Pager

## 2012-05-31 NOTE — Progress Notes (Signed)
2 Days Post-Op  Subjective: Tolerating clear liquids.  No flatus or BM.  Objective: Vital signs in last 24 hours: Temp:  [98.2 F (36.8 C)-98.5 F (36.9 C)] 98.2 F (36.8 C) (01/08 0451) Pulse Rate:  [69-72] 72  (01/08 0451) Resp:  [11-19] 17  (01/08 0748) BP: (123-144)/(68-80) 144/80 mmHg (01/08 0451) SpO2:  [98 %-100 %] 99 % (01/08 0748)    Intake/Output from previous day: 01/07 0701 - 01/08 0700 In: 2949.6 [P.O.:1720; I.V.:1229.6] Out: 4785 [Urine:4750; Drains:35] Intake/Output this shift: Total I/O In: -  Out: 400 [Urine:400]  PE: General- In NAD Abdomen-soft, open wound clean, thin serosanguinous drain output, active bowel sounds  Lab Results:   Basename 05/31/12 0546 05/30/12 0430  WBC 5.9 9.2  HGB 10.1* 10.8*  HCT 30.3* 31.4*  PLT 196 195   BMET  Basename 05/30/12 0430  NA 130*  K 4.1  CL 98  CO2 24  GLUCOSE 192*  BUN 8  CREATININE 0.47*  CALCIUM 8.6   PT/INR No results found for this basename: LABPROT:2,INR:2 in the last 72 hours Comprehensive Metabolic Panel:    Component Value Date/Time   NA 130* 05/30/2012 0430   K 4.1 05/30/2012 0430   CL 98 05/30/2012 0430   CO2 24 05/30/2012 0430   BUN 8 05/30/2012 0430   CREATININE 0.47* 05/30/2012 0430   CREATININE 0.49* 11/08/2011 1415   GLUCOSE 192* 05/30/2012 0430   CALCIUM 8.6 05/30/2012 0430   AST 22 05/23/2012 1120   ALT 15 05/23/2012 1120   ALKPHOS 114 05/23/2012 1120   BILITOT 0.3 05/23/2012 1120   PROT 8.2 05/23/2012 1120   ALBUMIN 3.8 05/23/2012 1120     Studies/Results: No results found.  Anti-infectives: Anti-infectives     Start     Dose/Rate Route Frequency Ordered Stop   05/29/12 2000   cefOXitin (MEFOXIN) 1 g in dextrose 5 % 50 mL IVPB        1 g 100 mL/hr over 30 Minutes Intravenous Every 6 hours 05/29/12 1549 05/30/12 0857   05/29/12 0600   cefOXitin (MEFOXIN) 2 g in dextrose 5 % 50 mL IVPB        2 g 100 mL/hr over 30 Minutes Intravenous On call to O.R. 05/28/12 1606 05/29/12 1110            Assessment Principal Problem:  *Colostomy status s/p laparoscopic closure on 05/29/12 Active Problems:  Asthma  ABL anemia-relatively stable    LOS: 2 days   Plan: Gluten free diet.  Decrease IVF.  Discharge instruction discussion initiated.   Sangeeta Youse J 05/31/2012

## 2012-06-01 MED ORDER — KETOROLAC TROMETHAMINE 30 MG/ML IJ SOLN
30.0000 mg | Freq: Four times a day (QID) | INTRAMUSCULAR | Status: AC
  Administered 2012-06-01 – 2012-06-02 (×7): 30 mg via INTRAVENOUS
  Filled 2012-06-01 (×7): qty 1

## 2012-06-01 NOTE — Progress Notes (Signed)
3 Days Post-Op  Subjective: Having some intermittent crampy pain.  No flatus or BM.  Objective: Vital signs in last 24 hours: Temp:  [98 F (36.7 C)-98.6 F (37 C)] 98 F (36.7 C) (01/09 0600) Pulse Rate:  [64-71] 64  (01/09 0600) Resp:  [15-18] 16  (01/09 0600) BP: (126-155)/(74-87) 126/79 mmHg (01/09 0600) SpO2:  [97 %-100 %] 98 % (01/09 0600)    Intake/Output from previous day: 01/08 0701 - 01/09 0700 In: 1478.3 [P.O.:640; I.V.:838.3] Out: 4240 [Urine:4175; Drains:65] Intake/Output this shift:    PE: General- In NAD Abdomen-soft, some distension today, open wound clean, thin serosanguinous drain output,  bowel sounds not as active  Lab Results:   Basename 05/31/12 0546 05/30/12 0430  WBC 5.9 9.2  HGB 10.1* 10.8*  HCT 30.3* 31.4*  PLT 196 195   BMET  Basename 05/30/12 0430  NA 130*  K 4.1  CL 98  CO2 24  GLUCOSE 192*  BUN 8  CREATININE 0.47*  CALCIUM 8.6   PT/INR No results found for this basename: LABPROT:2,INR:2 in the last 72 hours Comprehensive Metabolic Panel:    Component Value Date/Time   NA 130* 05/30/2012 0430   K 4.1 05/30/2012 0430   CL 98 05/30/2012 0430   CO2 24 05/30/2012 0430   BUN 8 05/30/2012 0430   CREATININE 0.47* 05/30/2012 0430   CREATININE 0.49* 11/08/2011 1415   GLUCOSE 192* 05/30/2012 0430   CALCIUM 8.6 05/30/2012 0430   AST 22 05/23/2012 1120   ALT 15 05/23/2012 1120   ALKPHOS 114 05/23/2012 1120   BILITOT 0.3 05/23/2012 1120   PROT 8.2 05/23/2012 1120   ALBUMIN 3.8 05/23/2012 1120     Studies/Results: No results found.  Anti-infectives: Anti-infectives     Start     Dose/Rate Route Frequency Ordered Stop   05/29/12 2000   cefOXitin (MEFOXIN) 1 g in dextrose 5 % 50 mL IVPB        1 g 100 mL/hr over 30 Minutes Intravenous Every 6 hours 05/29/12 1549 05/30/12 0857   05/29/12 0600   cefOXitin (MEFOXIN) 2 g in dextrose 5 % 50 mL IVPB        2 g 100 mL/hr over 30 Minutes Intravenous On call to O.R. 05/28/12 1606 05/29/12 1110          Assessment Principal Problem:  *Colostomy status s/p laparoscopic closure on 05/29/12-has some postop ileus Active Problems:  Asthma  ABL anemia-relatively stable    LOS: 3 days   Plan:   Add Toradol to help decrease narcotic use.  Wait for ileus to improve.   Mary Leach 06/01/2012

## 2012-06-02 MED ORDER — OXYCODONE HCL 5 MG PO TABS
5.0000 mg | ORAL_TABLET | ORAL | Status: DC | PRN
  Administered 2012-06-02 (×2): 5 mg via ORAL
  Administered 2012-06-03: 10 mg via ORAL
  Administered 2012-06-03 (×3): 5 mg via ORAL
  Administered 2012-06-04: 10 mg via ORAL
  Filled 2012-06-02: qty 1
  Filled 2012-06-02 (×3): qty 2
  Filled 2012-06-02 (×3): qty 1

## 2012-06-02 MED ORDER — FLUOXETINE HCL 10 MG PO CAPS
30.0000 mg | ORAL_CAPSULE | Freq: Every morning | ORAL | Status: DC
  Administered 2012-06-02: 30 mg via ORAL
  Administered 2012-06-03: 20 mg via ORAL
  Administered 2012-06-04: 30 mg via ORAL
  Filled 2012-06-02 (×4): qty 3

## 2012-06-02 NOTE — Progress Notes (Signed)
4 Days Post-Op  Subjective: Passed some gas.  No longer has crampy pain.  No BM.  Tolerating gluten-free diet.  Objective: Vital signs in last 24 hours: Temp:  [98.1 F (36.7 C)-98.8 F (37.1 C)] 98.1 F (36.7 C) (01/10 1552) Pulse Rate:  [61-76] 61  (01/10 0608) Resp:  [16-23] 16  (01/10 0608) BP: (110-124)/(75-92) 124/80 mmHg (01/10 0608) SpO2:  [96 %-100 %] 98 % (01/10 0608) FiO2 (%):  [99 %] 99 % (01/09 1533)    Intake/Output from previous day: 01/09 0701 - 01/10 0700 In: 2542.5 [P.O.:1205; I.V.:1337.5] Out: 2810 [Urine:2650; Drains:160] Intake/Output this shift:    PE: General- In NAD Abdomen-soft, less distension today, open wound clean, thin serosanguinous drain output,  bowel sounds present  Lab Results:   Basename 05/31/12 0546  WBC 5.9  HGB 10.1*  HCT 30.3*  PLT 196   BMET No results found for this basename: NA:2,K:2,CL:2,CO2:2,GLUCOSE:2,BUN:2,CREATININE:2,CALCIUM:2 in the last 72 hours PT/INR No results found for this basename: LABPROT:2,INR:2 in the last 72 hours Comprehensive Metabolic Panel:    Component Value Date/Time   NA 130* 05/30/2012 0430   K 4.1 05/30/2012 0430   CL 98 05/30/2012 0430   CO2 24 05/30/2012 0430   BUN 8 05/30/2012 0430   CREATININE 0.47* 05/30/2012 0430   CREATININE 0.49* 11/08/2011 1415   GLUCOSE 192* 05/30/2012 0430   CALCIUM 8.6 05/30/2012 0430   AST 22 05/23/2012 1120   ALT 15 05/23/2012 1120   ALKPHOS 114 05/23/2012 1120   BILITOT 0.3 05/23/2012 1120   PROT 8.2 05/23/2012 1120   ALBUMIN 3.8 05/23/2012 1120     Studies/Results: No results found.  Anti-infectives: Anti-infectives     Start     Dose/Rate Route Frequency Ordered Stop   05/29/12 2000   cefOXitin (MEFOXIN) 1 g in dextrose 5 % 50 mL IVPB        1 g 100 mL/hr over 30 Minutes Intravenous Every 6 hours 05/29/12 1549 05/30/12 0857   05/29/12 0600   cefOXitin (MEFOXIN) 2 g in dextrose 5 % 50 mL IVPB        2 g 100 mL/hr over 30 Minutes Intravenous On call to O.R.  05/28/12 1606 05/29/12 1110          Assessment Principal Problem:  *Colostomy status s/p laparoscopic closure on 05/29/12-some improvement in postop ileus Active Problems:  Asthma  ABL anemia-relatively stable    LOS: 4 days   Plan:   D/C PCA.  Oral analgesic.   Mary Leach 06/02/2012

## 2012-06-02 NOTE — Care Management Note (Signed)
    Page 1 of 1   06/02/2012     11:23:17 AM   CARE MANAGEMENT NOTE 06/02/2012  Patient:  Mary Leach, Mary Leach   Account Number:  [id number]  Date Initiated:  05/30/2012  Documentation initiated by:  Lorenda Ishihara  Subjective/Objective Assessment:   62 yo female admitted s/p lap lysis of adhesions and clostomy closure. PTA lived at home with spouse.     Action/Plan:   Home when stable   Anticipated DC Date:  06/02/2012   Anticipated DC Plan:  HOME/SELF CARE      DC Planning Services  CM consult      Choice offered to / List presented to:             Status of service:  Completed, signed off Medicare Important Message given?   (If response is "NO", the following Medicare IM given date fields will be blank) Date Medicare IM given:   Date Additional Medicare IM given:    Discharge Disposition:  HOME/SELF CARE  Per UR Regulation:  Reviewed for med. necessity/level of care/duration of stay  If discussed at Long Length of Stay Meetings, dates discussed:    Comments:

## 2012-06-03 MED ORDER — PANTOPRAZOLE SODIUM 40 MG PO TBEC
40.0000 mg | DELAYED_RELEASE_TABLET | Freq: Every day | ORAL | Status: DC
  Filled 2012-06-03 (×2): qty 1

## 2012-06-03 NOTE — Progress Notes (Signed)
5 Days Post-Op  Subjective: Passing a lot of gas but no BM yet.  Continues to tolerate diet. Objective: Vital signs in last 24 hours: Temp:  [98 F (36.7 C)-98.4 F (36.9 C)] 98.3 F (36.8 C) (01/11 0625) Pulse Rate:  [62-72] 62  (01/11 0625) Resp:  [16-18] 18  (01/11 0625) BP: (118-123)/(66-75) 123/69 mmHg (01/11 0625) SpO2:  [98 %-100 %] 98 % (01/11 0625)    Intake/Output from previous day: 01/10 0701 - 01/11 0700 In: 1711.7 [P.O.:480; I.V.:1231.7] Out: 2040 [Urine:2000; Drains:40] Intake/Output this shift: Total I/O In: -  Out: 515 [Urine:500; Drains:15]  PE: General- In NAD Abdomen-soft, less distension today, open wound clean, thin serosanguinous drain output, normal active bowel sounds present  Lab Results:  No results found for this basename: WBC:2,HGB:2,HCT:2,PLT:2 in the last 72 hours BMET No results found for this basename: NA:2,K:2,CL:2,CO2:2,GLUCOSE:2,BUN:2,CREATININE:2,CALCIUM:2 in the last 72 hours PT/INR No results found for this basename: LABPROT:2,INR:2 in the last 72 hours Comprehensive Metabolic Panel:    Component Value Date/Time   NA 130* 05/30/2012 0430   K 4.1 05/30/2012 0430   CL 98 05/30/2012 0430   CO2 24 05/30/2012 0430   BUN 8 05/30/2012 0430   CREATININE 0.47* 05/30/2012 0430   CREATININE 0.49* 11/08/2011 1415   GLUCOSE 192* 05/30/2012 0430   CALCIUM 8.6 05/30/2012 0430   AST 22 05/23/2012 1120   ALT 15 05/23/2012 1120   ALKPHOS 114 05/23/2012 1120   BILITOT 0.3 05/23/2012 1120   PROT 8.2 05/23/2012 1120   ALBUMIN 3.8 05/23/2012 1120     Studies/Results: No results found.  Anti-infectives: Anti-infectives     Start     Dose/Rate Route Frequency Ordered Stop   05/29/12 2000   cefOXitin (MEFOXIN) 1 g in dextrose 5 % 50 mL IVPB        1 g 100 mL/hr over 30 Minutes Intravenous Every 6 hours 05/29/12 1549 05/30/12 0857   05/29/12 0600   cefOXitin (MEFOXIN) 2 g in dextrose 5 % 50 mL IVPB        2 g 100 mL/hr over 30 Minutes Intravenous On call  to O.R. 05/28/12 1606 05/29/12 1110          Assessment Principal Problem:  *Colostomy status s/p laparoscopic closure on 05/29/12-continued improvement in postop ileus Active Problems:  Asthma  ABL anemia-relatively stable    LOS: 5 days   Plan:   Heplock IV.  Wait for BM.   Antwaine Boomhower J 06/03/2012

## 2012-06-04 MED ORDER — POLYETHYLENE GLYCOL 3350 17 G PO PACK
17.0000 g | PACK | Freq: Every day | ORAL | Status: DC
  Administered 2012-06-04: 17 g via ORAL
  Filled 2012-06-04 (×2): qty 1

## 2012-06-04 MED ORDER — ACETAMINOPHEN 325 MG PO TABS
650.0000 mg | ORAL_TABLET | ORAL | Status: DC | PRN
  Administered 2012-06-04: 650 mg via ORAL
  Filled 2012-06-04: qty 2

## 2012-06-04 MED ORDER — OXYCODONE HCL 5 MG PO TABS: 5.0000 mg | ORAL_TABLET | ORAL | Status: DC | PRN

## 2012-06-04 NOTE — Progress Notes (Signed)
6 Days Post-Op  Subjective: Continues to pass gas but no BM.   Objective: Vital signs in last 24 hours: Temp:  [97.5 F (36.4 C)-98.2 F (36.8 C)] 98.2 F (36.8 C) (01/12 0520) Pulse Rate:  [65-67] 67  (01/12 0520) Resp:  [18] 18  (01/12 0520) BP: (113-144)/(74-86) 113/74 mmHg (01/12 0520) SpO2:  [96 %-100 %] 96 % (01/12 0520)    Intake/Output from previous day: 01/11 0701 - 01/12 0700 In: 180 [P.O.:180] Out: 4350 [Urine:4100; Drains:250] Intake/Output this shift:    PE: General- In NAD Abdomen-soft,  open wound clean, thin serosanguinous drain output (drain removed), normal active bowel sounds present  Lab Results:  No results found for this basename: WBC:2,HGB:2,HCT:2,PLT:2 in the last 72 hours BMET No results found for this basename: NA:2,K:2,CL:2,CO2:2,GLUCOSE:2,BUN:2,CREATININE:2,CALCIUM:2 in the last 72 hours PT/INR No results found for this basename: LABPROT:2,INR:2 in the last 72 hours Comprehensive Metabolic Panel:    Component Value Date/Time   NA 130* 05/30/2012 0430   K 4.1 05/30/2012 0430   CL 98 05/30/2012 0430   CO2 24 05/30/2012 0430   BUN 8 05/30/2012 0430   CREATININE 0.47* 05/30/2012 0430   CREATININE 0.49* 11/08/2011 1415   GLUCOSE 192* 05/30/2012 0430   CALCIUM 8.6 05/30/2012 0430   AST 22 05/23/2012 1120   ALT 15 05/23/2012 1120   ALKPHOS 114 05/23/2012 1120   BILITOT 0.3 05/23/2012 1120   PROT 8.2 05/23/2012 1120   ALBUMIN 3.8 05/23/2012 1120     Studies/Results: No results found.  Anti-infectives: Anti-infectives     Start     Dose/Rate Route Frequency Ordered Stop   05/29/12 2000   cefOXitin (MEFOXIN) 1 g in dextrose 5 % 50 mL IVPB        1 g 100 mL/hr over 30 Minutes Intravenous Every 6 hours 05/29/12 1549 05/30/12 0857   05/29/12 0600   cefOXitin (MEFOXIN) 2 g in dextrose 5 % 50 mL IVPB        2 g 100 mL/hr over 30 Minutes Intravenous On call to O.R. 05/28/12 1606 05/29/12 1110          Assessment Principal Problem:  *Colostomy  status s/p laparoscopic closure on 05/29/12-passing gas but no BM yet Active Problems:  Asthma  ABL anemia-relatively stable    LOS: 6 days   Plan:   Start Miralax.   Roneisha Stern J 06/04/2012

## 2012-06-05 NOTE — Discharge Summary (Signed)
Physician Discharge Summary  Patient ID: Mary Leach MRN: [id number] DOB/AGE: June 21, 1950 62 y.o.  Admit date: 05/29/2012 Discharge date: 06/05/2012  Admission Diagnoses:  Colostomy status  Discharge Diagnoses:  Principal Problem:  *Colostomy status s/p laparoscopic closure on 05/29/12 Active Problems:  Asthma Celiac disease  Discharged Condition: good  Hospital Course: She underwent laparoscopic colostomy closure on 05/29/2012. Postoperatively she had a slow resumption of her bowel activity. She is passing gas fairly quickly but did not finally have a bowel movement until her seventh postoperative day. She was on a gluten free diet. Her pain was controlled with Tylenol. The colostomy site wound was healing by secondary intention and she was taught to do dressing changes. A pelvic drainage tube placed in the operation was removed on her sixth postoperative day. She was discharged in satisfactory condition and discharge instructions were given to her. She will follow up in the office in approximately 2 weeks.  Consults: None  Significant Diagnostic Studies: None  Treatments: surgery: Laparoscopic colostomy closure 05/29/2012  Discharge Exam: Blood pressure 93/62, pulse 59, temperature 97.7 F (36.5 C), temperature source Oral, resp. rate 18, height 5' 4"  (1.626 m), weight 140 lb 14 oz (63.9 kg), SpO2 99.00%.   Disposition: 01-Home or Self Care     Medication List     As of 06/05/2012  8:30 AM    TAKE these medications         acetaminophen 500 MG tablet   Commonly known as: TYLENOL   Take 1,000 mg by mouth every 6 (six) hours as needed. For pain      B-COMPLEX PO   Take 100 mg by mouth daily.      C COMPLEX PO   Take 500 mg by mouth daily.      cetirizine 10 MG tablet   Commonly known as: ZYRTEC   Take 10 mg by mouth daily as needed. For cold or laxative      EPINEPHrine 0.3 mg/0.3 mL Devi   Commonly known as: EPI-PEN   Inject 0.3 mg into the muscle once. For previous  allergies, maybe gluten      FLUoxetine 10 MG tablet   Commonly known as: PROZAC   Take 30 mg by mouth every morning.      fluticasone 50 MCG/ACT nasal spray   Commonly known as: FLONASE   Place 2 sprays into the nose daily.      Fluticasone-Salmeterol 100-50 MCG/DOSE Aepb   Commonly known as: ADVAIR   Inhale 1 puff into the lungs every 12 (twelve) hours.      montelukast 10 MG tablet   Commonly known as: SINGULAIR   Take 10 mg by mouth at bedtime.      OVER THE COUNTER MEDICATION   Take 1 capsule by mouth 2 (two) times daily. Algae omega 3 DHA(320)      oxyCODONE 5 MG immediate release tablet   Commonly known as: Oxy IR/ROXICODONE   Take 1-2 tablets (5-10 mg total) by mouth every 4 (four) hours as needed for pain.      PROBIOTIC DAILY PO   Take by mouth. metaflora IB      Vitamin D (Ergocalciferol) 50000 UNITS Caps   Commonly known as: DRISDOL   Take 50,000 Units by mouth every 14 (fourteen) days. On mondays      Zinc 22.5 MG Tabs   Take 22.5 mg by mouth daily.         Signed: Odis Hollingshead 06/05/2012, 8:30 AM

## 2012-06-05 NOTE — Progress Notes (Signed)
7 Days Post-Op  Subjective: Had a good bowel movement last night.  Feels good.  Not much pain Objective: Vital signs in last 24 hours: Temp:  [97.7 F (36.5 C)-98.4 F (36.9 C)] 97.7 F (36.5 C) (01/13 0500) Pulse Rate:  [59-64] 59  (01/13 0500) Resp:  [18] 18  (01/13 0500) BP: (93-118)/(62-74) 93/62 mmHg (01/13 0500) SpO2:  [96 %-99 %] 99 % (01/13 0500)    Intake/Output from previous day: 01/12 0701 - 01/13 0700 In: -  Out: 1800 [Urine:1800] Intake/Output this shift:    PE: General- In NAD Abdomen-soft,  open wound clean  Lab Results:  No results found for this basename: WBC:2,HGB:2,HCT:2,PLT:2 in the last 72 hours BMET No results found for this basename: NA:2,K:2,CL:2,CO2:2,GLUCOSE:2,BUN:2,CREATININE:2,CALCIUM:2 in the last 72 hours PT/INR No results found for this basename: LABPROT:2,INR:2 in the last 72 hours Comprehensive Metabolic Panel:    Component Value Date/Time   NA 130* 05/30/2012 0430   K 4.1 05/30/2012 0430   CL 98 05/30/2012 0430   CO2 24 05/30/2012 0430   BUN 8 05/30/2012 0430   CREATININE 0.47* 05/30/2012 0430   CREATININE 0.49* 11/08/2011 1415   GLUCOSE 192* 05/30/2012 0430   CALCIUM 8.6 05/30/2012 0430   AST 22 05/23/2012 1120   ALT 15 05/23/2012 1120   ALKPHOS 114 05/23/2012 1120   BILITOT 0.3 05/23/2012 1120   PROT 8.2 05/23/2012 1120   ALBUMIN 3.8 05/23/2012 1120     Studies/Results: No results found.  Anti-infectives: Anti-infectives     Start     Dose/Rate Route Frequency Ordered Stop   05/29/12 2000   cefOXitin (MEFOXIN) 1 g in dextrose 5 % 50 mL IVPB        1 g 100 mL/hr over 30 Minutes Intravenous Every 6 hours 05/29/12 1549 05/30/12 0857   05/29/12 0600   cefOXitin (MEFOXIN) 2 g in dextrose 5 % 50 mL IVPB        2 g 100 mL/hr over 30 Minutes Intravenous On call to O.R. 05/28/12 1606 05/29/12 1110          Assessment Principal Problem:  *Colostomy status s/p laparoscopic closure on 05/29/12-bowels are moving Active Problems:   Asthma  ABL anemia-relatively stable    LOS: 7 days   Plan:   Discharge.  Instructions given.   [place]phis Decoteau J 06/05/2012

## 2012-06-05 NOTE — Progress Notes (Signed)
Patient discharged via wheelchair. Rx for oxycodone given. States understanding of discharge instructions. States she or husband will be doing drsg changes and verbally demonstrates understanding of discharge instructions.

## 2012-06-16 ENCOUNTER — Encounter (INDEPENDENT_AMBULATORY_CARE_PROVIDER_SITE_OTHER): Payer: Self-pay

## 2012-06-26 ENCOUNTER — Encounter (INDEPENDENT_AMBULATORY_CARE_PROVIDER_SITE_OTHER): Payer: Self-pay | Admitting: General Surgery

## 2012-06-26 ENCOUNTER — Ambulatory Visit (INDEPENDENT_AMBULATORY_CARE_PROVIDER_SITE_OTHER): Payer: 59 | Admitting: General Surgery

## 2012-06-26 VITALS — BP 100/56 | HR 68 | Temp 97.4°F | Resp 12 | Ht 64.75 in | Wt 123.0 lb

## 2012-06-26 DIAGNOSIS — Z9889 Other specified postprocedural states: Secondary | ICD-10-CM

## 2012-06-26 NOTE — Progress Notes (Signed)
Procedure:  Laparoscopic-assisted colostomy closure  Date:  05/29/2012  Pathology:  Benign colostomy  History:  She is here for her first postoperative visit and is doing well. Her bowels are moving fairly well. The wound is healing in well the  Exam: General- Is in NAD. Abdomen-soft, open wound on left side is healing in well; small periumbilical incisional hernia.  Assessment:  Doing well postoperatively.  Plan:  May resume normal activities as tolerated in 2 weeks. Continue current wound care. Return visit one month.

## 2012-06-26 NOTE — Patient Instructions (Addendum)
In 2 weeks, may resume normal activities as tolerated as we discussed.  Continue current wound care.

## 2012-06-28 ENCOUNTER — Encounter (INDEPENDENT_AMBULATORY_CARE_PROVIDER_SITE_OTHER): Payer: Self-pay

## 2012-07-21 ENCOUNTER — Encounter: Payer: Self-pay | Admitting: Family Medicine

## 2012-07-24 ENCOUNTER — Ambulatory Visit (INDEPENDENT_AMBULATORY_CARE_PROVIDER_SITE_OTHER): Payer: 59 | Admitting: General Surgery

## 2012-07-24 ENCOUNTER — Encounter (INDEPENDENT_AMBULATORY_CARE_PROVIDER_SITE_OTHER): Payer: Self-pay | Admitting: General Surgery

## 2012-07-24 VITALS — BP 116/62 | HR 80 | Temp 97.8°F | Ht 64.75 in | Wt 123.2 lb

## 2012-07-24 DIAGNOSIS — Z9889 Other specified postprocedural states: Secondary | ICD-10-CM

## 2012-07-24 NOTE — Progress Notes (Signed)
Procedure:  Laparoscopic-assisted colostomy closure  Date:  05/29/2012  Pathology:  Benign colostomy  History:  She is here for her second postoperative visit and continues to do well. Her bowels are moving well. The wound is healed.  Exam: General- Is in NAD. Abdomen-soft, left sided wound is clean, periumbilical ventral hernia is present and easily reducible.  Assessment:  Doing well postoperatively.  Has an asx ventral hernia.  Plan:  Avoid repetitive heavy lifting and straining as much as possible. If the hernia becomes symptomatic and she has gained all her strength back, she can come back and see me and we can discuss repair.

## 2012-07-24 NOTE — Patient Instructions (Signed)
Avoid constipation. Avoid a lot of heavy lifting.  You have an abdominal wall hernia. Once your strength is normal and if the hernia is bothering you, call and we can see you about repair.

## 2012-08-16 ENCOUNTER — Telehealth (INDEPENDENT_AMBULATORY_CARE_PROVIDER_SITE_OTHER): Payer: Self-pay

## 2012-08-16 NOTE — Telephone Encounter (Signed)
I called back to let her know light lifting and cardio was okay. I told her no abdominal workout and walking was fine. She understands that her body will let her know what workouts she can handle.

## 2012-08-16 NOTE — Telephone Encounter (Signed)
Patient called in stating she was released from Dr Arne Cleveland care but she wanted to make sure it was okay for her to exercise from his standpoint. She is signing up for 30 minute light weights and light cardio class twice a week. She would like a call back on her cell once he says it is okay for her to sign up for these classes.

## 2012-08-23 ENCOUNTER — Encounter (INDEPENDENT_AMBULATORY_CARE_PROVIDER_SITE_OTHER): Payer: Self-pay

## 2012-12-06 ENCOUNTER — Telehealth: Payer: Self-pay | Admitting: Obstetrics and Gynecology

## 2012-12-06 MED ORDER — VITAMIN D (ERGOCALCIFEROL) 1.25 MG (50000 UNIT) PO CAPS: 50000.0000 [IU] | ORAL_CAPSULE | ORAL | Status: DC

## 2012-12-06 NOTE — Telephone Encounter (Signed)
Aex scheduled for 12/12/12 #4/0rf's sent to last pt until AEX, pt is aware.

## 2012-12-06 NOTE — Telephone Encounter (Signed)
Patient calling needing a refill as soon as possible for Vit D sent to [facility].

## 2012-12-11 ENCOUNTER — Encounter: Payer: Self-pay | Admitting: *Deleted

## 2012-12-12 ENCOUNTER — Encounter: Payer: Self-pay | Admitting: Obstetrics and Gynecology

## 2012-12-12 ENCOUNTER — Ambulatory Visit (INDEPENDENT_AMBULATORY_CARE_PROVIDER_SITE_OTHER): Payer: 59 | Admitting: Obstetrics and Gynecology

## 2012-12-12 VITALS — BP 100/60 | HR 76 | Resp 18 | Ht 64.5 in | Wt 125.0 lb

## 2012-12-12 DIAGNOSIS — Z01419 Encounter for gynecological examination (general) (routine) without abnormal findings: Secondary | ICD-10-CM

## 2012-12-12 DIAGNOSIS — Z0000 Encounter for general adult medical examination without abnormal findings: Secondary | ICD-10-CM

## 2012-12-12 LAB — HEMOGLOBIN, FINGERSTICK: Hemoglobin, fingerstick: 12 g/dL (ref 12.0–16.0)

## 2012-12-12 LAB — POCT URINALYSIS DIPSTICK
Bilirubin, UA: NEGATIVE
Blood, UA: NEGATIVE
Glucose, UA: NEGATIVE
Ketones, UA: NEGATIVE
Leukocytes, UA: NEGATIVE
Nitrite, UA: NEGATIVE
Protein, UA: NEGATIVE
Urobilinogen, UA: NEGATIVE
pH, UA: 5

## 2012-12-12 NOTE — Progress Notes (Signed)
62 y.o.   Married    Caucasian   female   G2P2   here for annual exam.    Patient's last menstrual period was 05/24/2006.          Sexually active: yes  The current method of family planning is tubal ligation, Depo-Provera injections and post menopausal status.    Exercising: Actuary, cardio, weights 2 days a week Last mammogram: 12/10/11 neg  Last pap smear:09/27/11 neg History of abnormal pap: no Smoking: no Alcohol: 1-3 drinks a week wine or gluten free beer Last colonoscopy:09/2011 normal, repeat in 10 years Last Bone Density:  2007 normal Last tetanus shot: less than 10 years Last cholesterol check: not sure Hgb 12.0    Urine neg Hgb:                Urine:   Family History  Problem Relation Age of Onset  . Heart disease Father   . Osteoporosis Father   . Mental illness Paternal Aunt   . Cancer Maternal Grandmother     SKIN  . Hypertension Maternal Grandfather   . Heart disease Paternal Grandfather   . Osteoporosis Mother     Patient Active Problem List   Diagnosis Date Noted  . 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 Asthma 10/18/2011  . ADD (attention deficit disorder with hyperactivity) 10/18/2011  . Rectal Perforation due to fecal impaction s/p Hartmann procedure 5/16 10/08/2011    Past Medical History  Diagnosis Date  . Asthma   . ADD (attention deficit disorder)   . RBBB (right bundle branch block)   . Allergy     RHINITIS  . History of colonic polyps   . Asthma 10/18/2011  . ADD (attention deficit disorder with hyperactivity) 10/18/2011  . Ileus following gastrointestinal surgery 10/18/2011  . Malnutrition following gastrointestinal surgery 10/18/2011  . Celiac disease   . Depression   . Gluten free diet     allergic to gluten    Past Surgical History  Procedure Laterality Date  . Tonsillectomy and adenoidectomy    . Cystectomy      LEFT FOOT, RIGHT HAND  . Colonoscopy w/ polypectomy    . Laparotomy   10/07/2011    Procedure: EXPLORATORY LAPAROTOMY;  Surgeon: Odis Hollingshead, MD;  Location: [facility];  Service: General;  Laterality: N/A;  hartmann procedure and creation of colostomy  . Upper gastrointestinal endoscopy  01/19/12  . Colon surgery    . Colostomy takedown  05/29/2012    Procedure: LAPAROSCOPIC COLOSTOMY TAKEDOWN;  Surgeon: Odis Hollingshead, MD;  Location: [facility];  Service: General;  Laterality: N/A;  Laparoscopic Assisted Colostomy Closure  with proctoscopy  . Tubal ligation    . Foot surgery      Allergies: Sulfonamide derivatives  Current Outpatient Prescriptions  Medication Sig Dispense Refill  . B Complex-Biotin-FA (B-COMPLEX PO) Take 100 mg by mouth daily.      Marland Kitchen Bioflavonoid Products (C COMPLEX PO) Take 500 mg by mouth daily.      . cetirizine (ZYRTEC) 10 MG tablet Take 10 mg by mouth daily as needed. For cold or laxative      . EPINEPHrine (EPI-PEN) 0.3 mg/0.3 mL DEVI Inject 0.3 mg into the muscle once. For previous allergies, maybe gluten      . FLUoxetine (PROZAC) 10 MG tablet Take 30 mg by mouth every morning.       . fluticasone (FLONASE) 50 MCG/ACT nasal spray Place 2 sprays  into the nose daily.       . multivitamin-lutein (OCUVITE-LUTEIN) CAPS Take 1 capsule by mouth daily.      Marland Kitchen OVER THE COUNTER MEDICATION Take 1 capsule by mouth 2 (two) times daily. Algae omega 3 DHA(320)      . Probiotic Product (PROBIOTIC DAILY PO) Take by mouth. metaflora IB      . Vitamin D, Ergocalciferol, (DRISDOL) 50000 UNITS CAPS Take 1 capsule (50,000 Units total) by mouth every 14 (fourteen) days. On mondays  4 capsule  0  . Zinc 22.5 MG TABS Take 22.5 mg by mouth daily.       No current facility-administered medications for this visit.    ROS: Pertinent items are noted in HPI.  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  Exam:    BP 100/60  Pulse 76  Resp 18  Ht 5' 4.5" (1.638 m)  Wt 125 lb (56.7 kg)  BMI 21.13 kg/m2  LMP 05/24/2006  Ht  stable, wt down 5 pounds from last year Wt Readings from Last 3 Encounters:  12/12/12 125 lb (56.7 kg)  07/24/12 123 lb 3.2 oz (55.883 kg)  06/26/12 123 lb (55.792 kg)     Ht Readings from Last 3 Encounters:  12/12/12 5' 4.5" (1.638 m)  07/24/12 5' 4.75" (1.645 m)  06/26/12 5' 4.75" (1.645 m)    General appearance: alert, cooperative and appears stated age Head: Normocephalic, without obvious abnormality, atraumatic Neck: no adenopathy, supple, symmetrical, trachea midline and thyroid not enlarged, symmetric, no tenderness/mass/nodules Lungs: clear to auscultation bilaterally Breasts: Inspection negative, No nipple retraction or dimpling, No nipple discharge or bleeding, No axillary or supraclavicular adenopathy, Normal to palpation without dominant masses Heart: regular rate and rhythm Abdomen: soft, non-tender; bowel sounds normal; no masses,  no organomegaly Scars c/w x lap and colostomy with takedown. Extremities: extremities normal, atraumatic, no cyanosis or edema Skin: Skin color, texture, turgor normal. No rashes or lesions Lymph nodes: Cervical, supraclavicular, and axillary nodes normal. No abnormal inguinal nodes palpated Neurologic: Grossly normal   Pelvic: External genitalia:  no lesions              Urethra:  normal appearing urethra with no masses, tenderness or lesions              Bartholins and Skenes: normal                 Vagina: normal appearing vagina with normal color and discharge, no lesions              Cervix: normal appearance              Pap taken: no        Bimanual Exam:  Uterus:  uterus is normal size, shape, consistency and nontender                                      Adnexa: normal adnexa in size, nontender and no masses                                      Rectovaginal: Confirms  Anus:  normal sphincter tone, no lesions  A: normal menopausal exam, no HRT     Celiac dz     Chronic constipation, perforated  rectosigmoid, x lap, resection of part of colon, temporary colostomy, felt to be d/t celiac dz     Depression, ADD     P: mammogram counseled on breast self exam, mammography screening, adequate intake of calcium and vitamin D, diet and exercise return annually or prn     An After Visit Summary was printed and given to the patient.

## 2012-12-12 NOTE — Patient Instructions (Signed)

## 2013-01-17 ENCOUNTER — Ambulatory Visit: Payer: Self-pay | Admitting: Obstetrics and Gynecology

## 2013-01-23 ENCOUNTER — Telehealth: Payer: Self-pay | Admitting: Obstetrics and Gynecology

## 2013-01-23 NOTE — Telephone Encounter (Signed)
[place] Patient requests a refill on Vitamin D please.

## 2013-01-23 NOTE — Telephone Encounter (Signed)
Refill request for Vitamin D Last filled by MD on - 09/07/10, #26 per paper chart Last Vitamin D - 08/27/09 = 38 Last AEX - 12/12/12 Next AEX - 12/13/13 with Dr Farrel Gobble  Please advise refills.

## 2013-01-29 NOTE — Telephone Encounter (Signed)
Ok to refill until annual

## 2013-01-30 MED ORDER — VITAMIN D (ERGOCALCIFEROL) 1.25 MG (50000 UNIT) PO CAPS: 50000.0000 [IU] | ORAL_CAPSULE | ORAL | Status: DC

## 2013-01-30 NOTE — Telephone Encounter (Signed)
RX sent

## 2013-03-01 ENCOUNTER — Encounter: Payer: Self-pay | Admitting: Family Medicine

## 2013-03-06 ENCOUNTER — Encounter: Payer: Self-pay | Admitting: Gynecology

## 2013-03-29 ENCOUNTER — Other Ambulatory Visit: Payer: Self-pay

## 2013-03-30 ENCOUNTER — Encounter: Payer: Self-pay | Admitting: Internal Medicine

## 2013-05-13 IMAGING — CR DG ABDOMEN 2V
2 series · 2 of 2 positions shown · non-contrast
Comparison: 01/27/2012

CLINICAL DATA: Sitz marker study

ABDOMEN - 2 VIEW

[t abdomen supine]
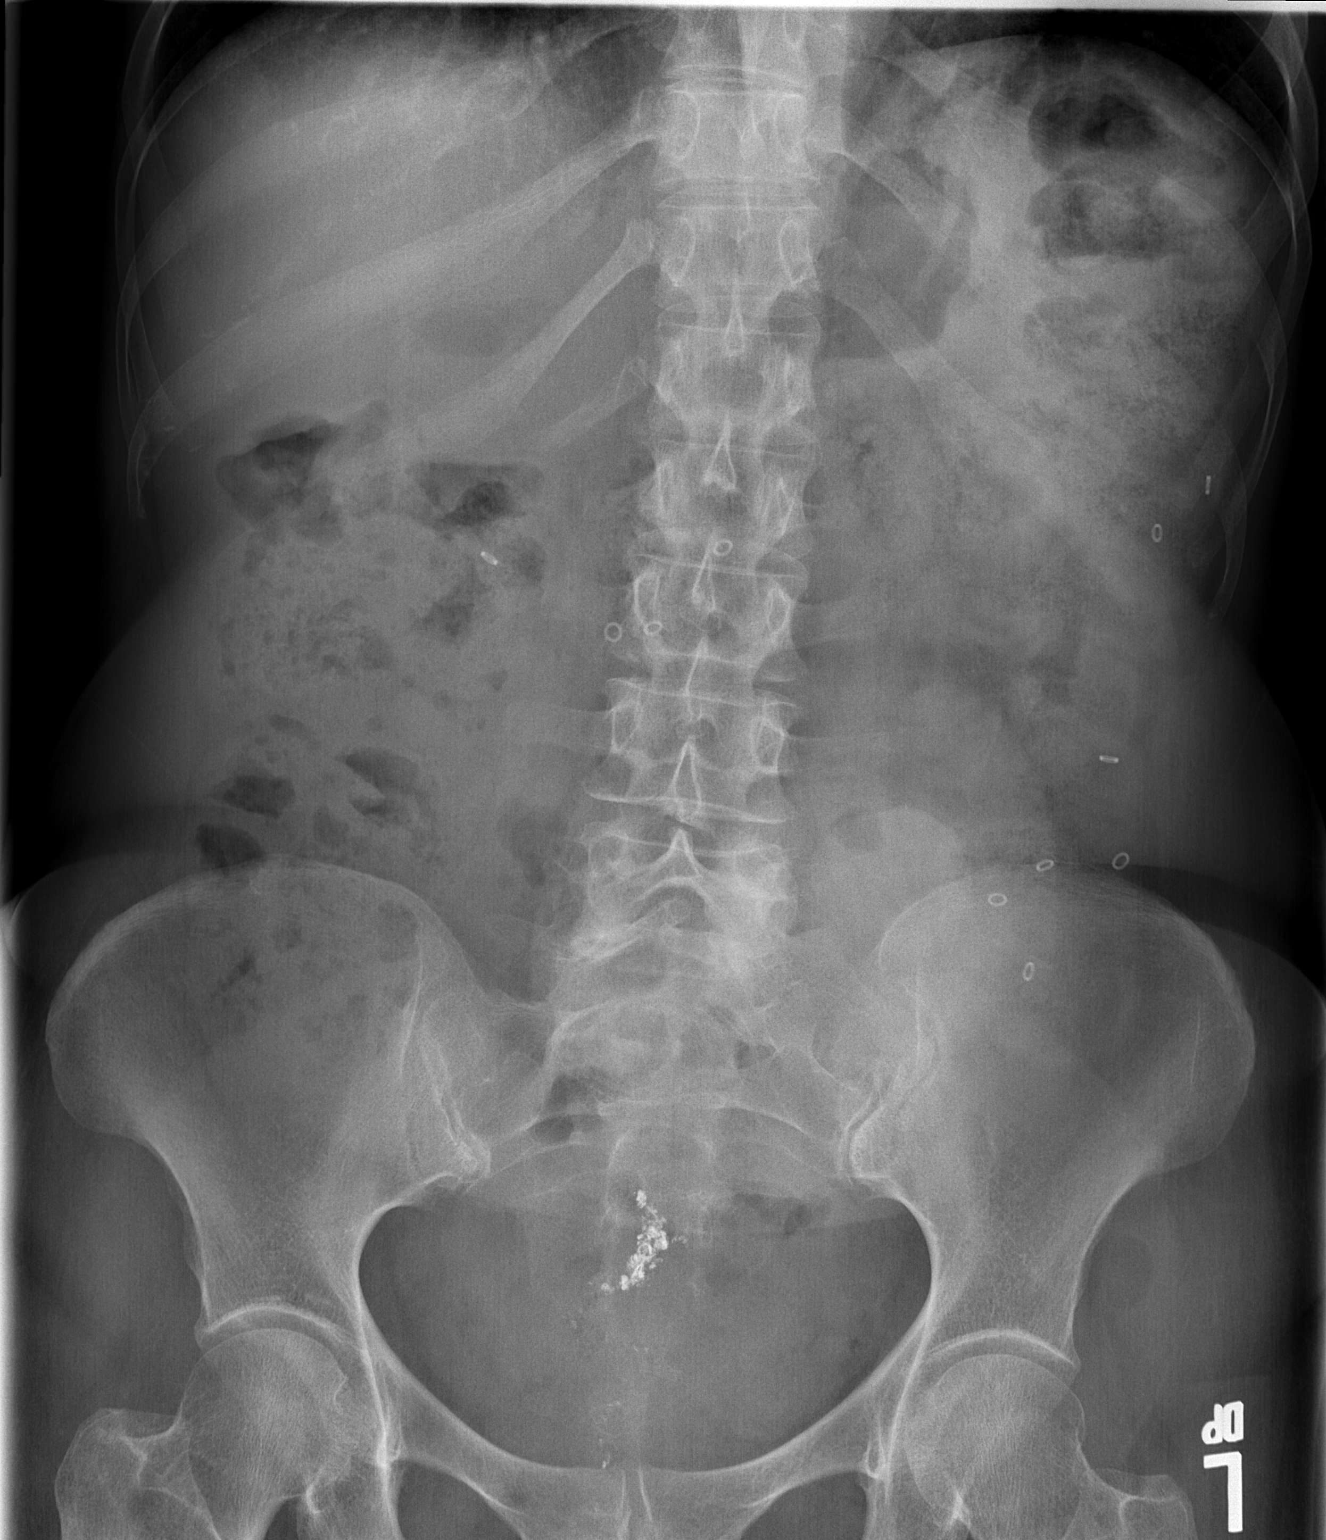

[w abdomen upright *]
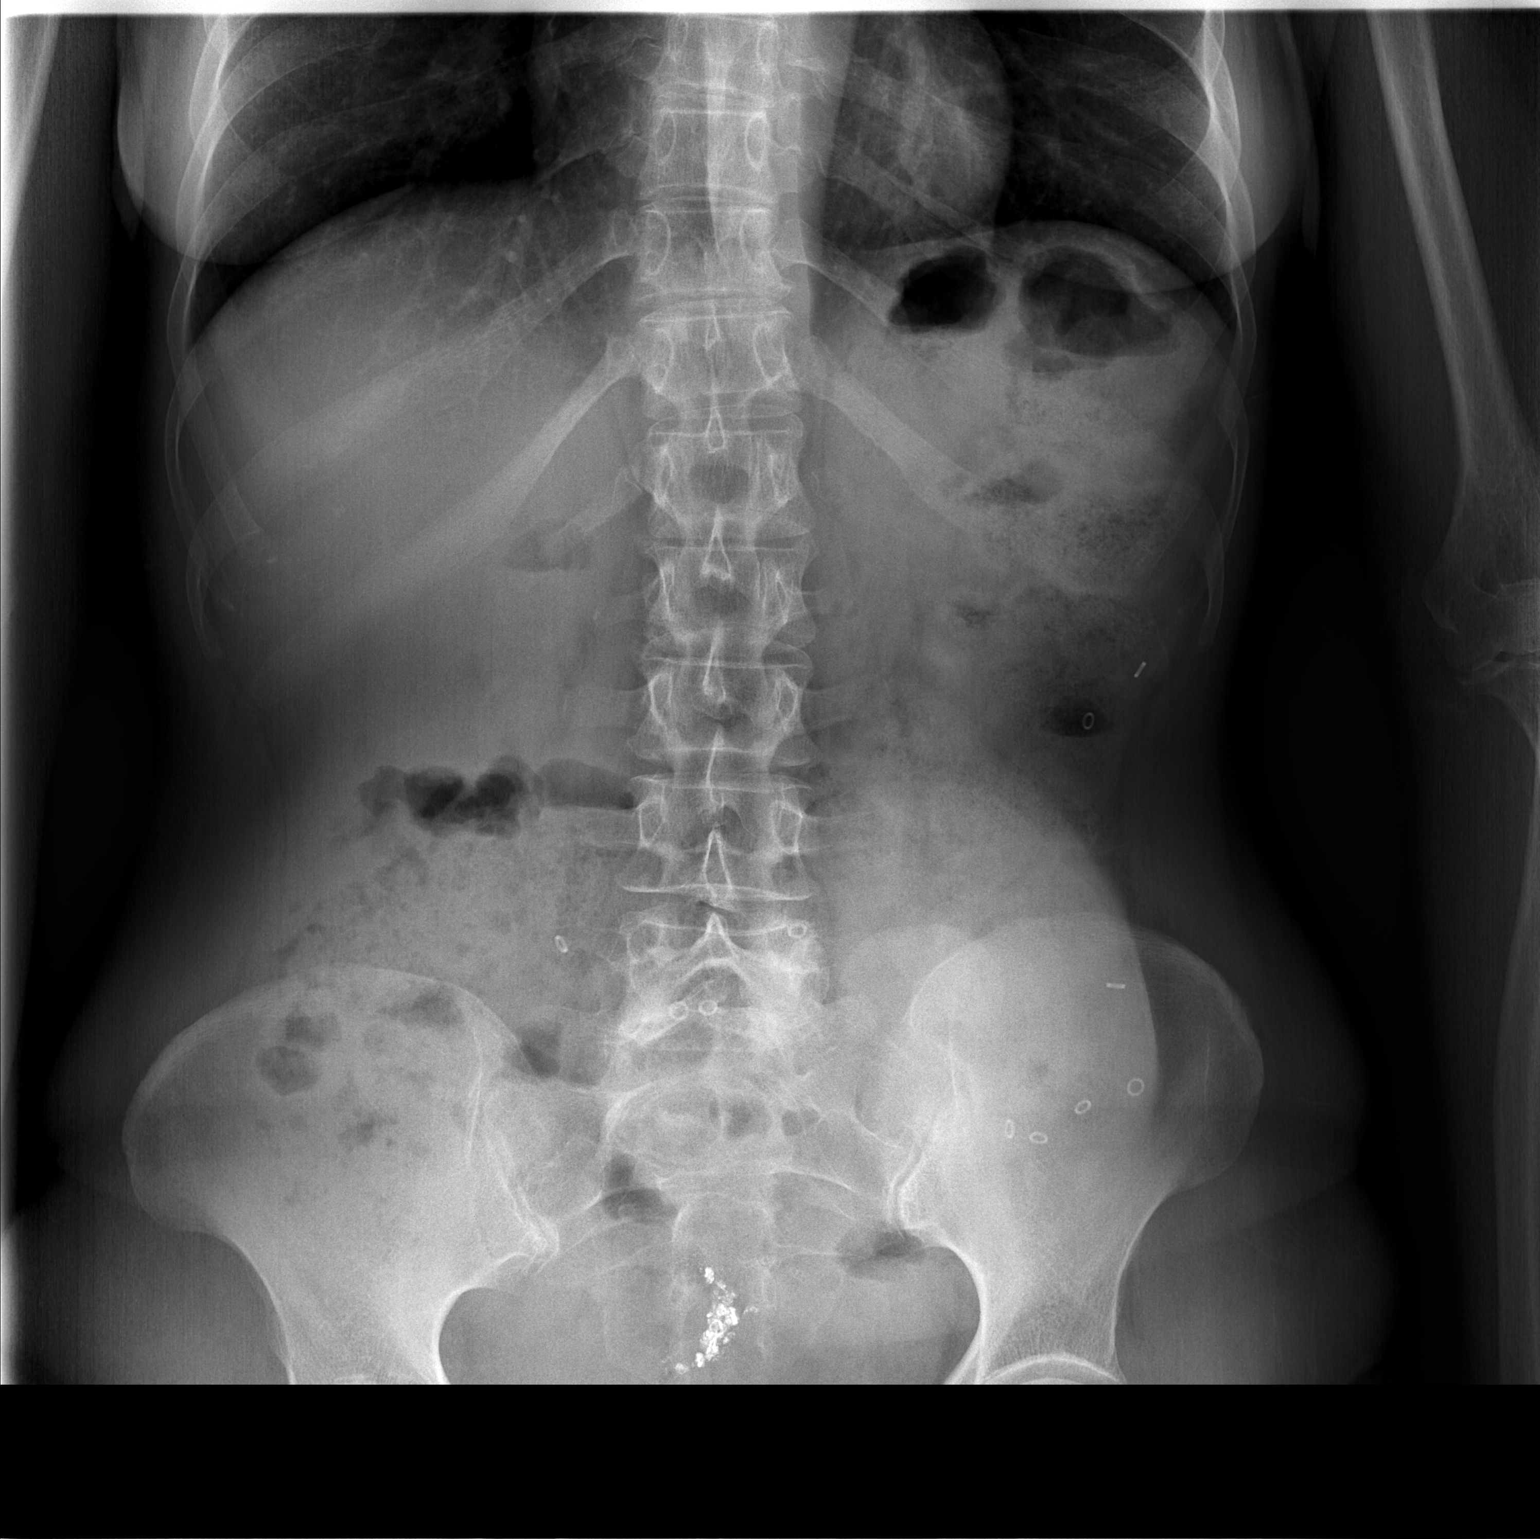

[2 of 2 positions shown; findings below may reference images not displayed]

FINDINGS: This is day three of the sitz marker study.  There are
four markers identified in the expected location of the transverse
colon.  Seven sitz markers are identified in the expected location
of the descending colon.  Residual barium noted within the rectum.
IMPRESSION: 1. On date 3 of the sitz marker study 11 markers are remaining in
the colon.

## 2013-05-22 ENCOUNTER — Ambulatory Visit (INDEPENDENT_AMBULATORY_CARE_PROVIDER_SITE_OTHER): Payer: 59 | Admitting: Medical

## 2013-05-22 ENCOUNTER — Encounter: Payer: Self-pay | Admitting: Medical

## 2013-05-22 VITALS — BP 110/70 | HR 60 | Temp 98.1°F | Wt 128.0 lb

## 2013-05-22 DIAGNOSIS — J329 Chronic sinusitis, unspecified: Secondary | ICD-10-CM

## 2013-05-22 MED ORDER — AMOXICILLIN 875 MG PO TABS: 875.0000 mg | ORAL_TABLET | Freq: Two times a day (BID) | ORAL | Status: DC

## 2013-05-22 NOTE — Progress Notes (Signed)
Subjective:  Mary Leach is a 62 y.o. female who presents for cold symptoms.  Started about a week ago with cough, post nasal drainage, congestion, developed into cough.  Has had some chills, fatigue, brain fog, stuffy nose, vague headache, stuffy head, sinus pressure, some teeth pain.  Has dark green nasal discharge, sometimes tinged with blood.  Has hx/o sinus infection.  Eating soup with ginger, neti pot, zyrtec.  Denies fever, NVD.  Denies sick contacts.  No other aggravating or relieving factors.  No other c/o.  ROS as in subjective.  Past Medical History  Diagnosis Date  . Asthma   . ADD (attention deficit disorder)   . RBBB (right bundle branch block)   . Allergy     RHINITIS  . History of colonic polyps   . Asthma 10/18/2011  . ADD (attention deficit disorder with hyperactivity) 10/18/2011  . Ileus following gastrointestinal surgery 10/18/2011  . Malnutrition following gastrointestinal surgery 10/18/2011  . Celiac disease   . Depression   . Gluten free diet     allergic to gluten    Objective: Filed Vitals:   05/22/13 1531  BP: 110/70  Pulse: 60  Temp: 98.1 F (36.7 C)    General appearance: Alert, WD/WN, no distress, mildly ill appearing                             Skin: warm, no rash                           Head: frontal and left maxilary sinus tenderness                            Eyes: conjunctiva normal, corneas clear, PERRLA                            Ears: pearly TMs, external ear canals normal                          Nose: septum midline, turbinates swollen, with erythema and clear discharge             Mouth/throat: MMM, tongue normal, mild pharyngeal erythema                           Neck: supple, no adenopathy, no thyromegaly, nontender                        Lungs: CTA bilaterally, no wheezes, rales, or rhonchi     Assessment: Encounter Diagnosis  Name Primary?  . Sinusitis Yes   Plan: C/t net pot, good hydration, begin amoxicillin, Tylenol or  Ibuprofen OTC for fever and malaise.  Call/return in 4-5 days if symptoms aren't resolving.

## 2013-06-01 ENCOUNTER — Telehealth (INDEPENDENT_AMBULATORY_CARE_PROVIDER_SITE_OTHER): Payer: Self-pay

## 2013-06-01 NOTE — Telephone Encounter (Signed)
Pt was cleared by Dr. Zella Richer to begin abdominal exercises.

## 2013-06-06 ENCOUNTER — Other Ambulatory Visit (INDEPENDENT_AMBULATORY_CARE_PROVIDER_SITE_OTHER): Payer: 59

## 2013-06-06 DIAGNOSIS — Z111 Encounter for screening for respiratory tuberculosis: Secondary | ICD-10-CM

## 2013-06-08 LAB — TB SKIN TEST
Induration: 0 mm
TB Skin Test: NEGATIVE

## 2013-08-07 ENCOUNTER — Encounter: Payer: Self-pay | Admitting: Family Medicine

## 2013-12-13 ENCOUNTER — Ambulatory Visit: Payer: 59 | Admitting: Gynecology

## 2013-12-14 ENCOUNTER — Ambulatory Visit (INDEPENDENT_AMBULATORY_CARE_PROVIDER_SITE_OTHER): Payer: 59 | Admitting: Gynecology

## 2013-12-14 ENCOUNTER — Encounter: Payer: Self-pay | Admitting: Gynecology

## 2013-12-14 VITALS — BP 114/72 | HR 94 | Resp 20 | Ht 64.5 in | Wt 131.0 lb

## 2013-12-14 DIAGNOSIS — Z0000 Encounter for general adult medical examination without abnormal findings: Secondary | ICD-10-CM

## 2013-12-14 DIAGNOSIS — Z13220 Encounter for screening for lipoid disorders: Secondary | ICD-10-CM

## 2013-12-14 DIAGNOSIS — Z01419 Encounter for gynecological examination (general) (routine) without abnormal findings: Secondary | ICD-10-CM

## 2013-12-14 DIAGNOSIS — E559 Vitamin D deficiency, unspecified: Secondary | ICD-10-CM

## 2013-12-14 DIAGNOSIS — E2839 Other primary ovarian failure: Secondary | ICD-10-CM

## 2013-12-14 LAB — POCT URINALYSIS DIPSTICK
Leukocytes, UA: NEGATIVE
Urobilinogen, UA: NEGATIVE
pH, UA: 5

## 2013-12-14 LAB — HEMOGLOBIN, FINGERSTICK: Hemoglobin, fingerstick: 13.1 g/dL (ref 12.0–16.0)

## 2013-12-14 NOTE — Progress Notes (Signed)
63 y.o. Married Caucasian female   G2P2 here for annual exam. Pt reports menses are absent due to Menopause.  She does not report hot flashes, does not have night sweats, does have vaginal dryness.  She is not using lubricants.  She does not report post-menopasual bleeding.  Pt had bowel rupture with colostomy and revision last year.  Pt currently not sexually active, female factor.  Patient's last menstrual period was 05/24/2006.          Sexually active: No.  The current method of family planning is post menopausal status and tubal ligation   Exercising: Yes.    muscle training 2x/wk Last pap: 09/27/11 Neg Abnormal PAP: no Mammogram: 12/10/11 Bi-Rads 1 BSE: occasionally  Colonoscopy: 10/21/10 Polyps f/u in 5 years DEXA: normal, 2007 Alcohol: 5 drinks/wk Tobacco: no  Hgb: 13.1 ; Urine: Negative   Health Maintenance  Topic Date Due  . Pap Smear  07/25/1969  . Zostavax  07/26/2011  . Mammogram  12/09/2013  . Influenza Vaccine  12/22/2013  . Tetanus/tdap  06/27/2019  . Colonoscopy  10/20/2020    Family History  Problem Relation Age of Onset  . Heart disease Father   . Osteoporosis Father   . Mental illness Paternal Aunt   . Cancer Maternal Grandmother     SKIN  . Hypertension Maternal Grandfather   . Heart disease Paternal Grandfather   . Osteoporosis Mother     Patient Active Problem List   Diagnosis Date Noted  . 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 Asthma 10/18/2011  . ADD (attention deficit disorder with hyperactivity) 10/18/2011  . Rectal Perforation due to fecal impaction s/p Hartmann procedure 5/16 10/08/2011    Past Medical History  Diagnosis Date  . Asthma   . ADD (attention deficit disorder)   . RBBB (right bundle branch block)   . Allergy     RHINITIS  . History of colonic polyps   . Asthma 10/18/2011  . ADD (attention deficit disorder with hyperactivity) 10/18/2011  . Ileus following gastrointestinal surgery 10/18/2011  .  Malnutrition following gastrointestinal surgery 10/18/2011  . Celiac disease   . Depression   . Gluten free diet     allergic to gluten    Past Surgical History  Procedure Laterality Date  . Tonsillectomy and adenoidectomy    . Cystectomy      LEFT FOOT, RIGHT HAND  . Colonoscopy w/ polypectomy    . Laparotomy  10/07/2011    Procedure: EXPLORATORY LAPAROTOMY;  Surgeon: Odis Hollingshead, MD;  Location: [facility];  Service: General;  Laterality: N/A;  hartmann procedure and creation of colostomy  . Upper gastrointestinal endoscopy  01/19/12  . Colon surgery    . Colostomy takedown  05/29/2012    Procedure: LAPAROSCOPIC COLOSTOMY TAKEDOWN;  Surgeon: Odis Hollingshead, MD;  Location: [facility];  Service: General;  Laterality: N/A;  Laparoscopic Assisted Colostomy Closure  with proctoscopy  . Tubal ligation    . Foot surgery      Allergies: Sulfonamide derivatives  Current Outpatient Prescriptions  Medication Sig Dispense Refill  . amoxicillin (AMOXIL) 875 MG tablet Take 1 tablet (875 mg total) by mouth 2 (two) times daily.  20 tablet  0  . B Complex-Biotin-FA (B-COMPLEX PO) Take 100 mg by mouth daily.      Marland Kitchen Bioflavonoid Products (C COMPLEX PO) Take 500 mg by mouth daily.      . cetirizine (ZYRTEC) 10 MG tablet Take 10 mg by mouth daily  as needed. For cold or laxative      . EPINEPHrine (EPI-PEN) 0.3 mg/0.3 mL DEVI Inject 0.3 mg into the muscle once. For previous allergies, maybe gluten      . FLUoxetine (PROZAC) 10 MG tablet Take 30 mg by mouth every morning.       . fluticasone (FLONASE) 50 MCG/ACT nasal spray Place 2 sprays into the nose daily.       . multivitamin-lutein (OCUVITE-LUTEIN) CAPS Take 1 capsule by mouth daily.      Marland Kitchen OVER THE COUNTER MEDICATION Take 1 capsule by mouth 2 (two) times daily. Algae omega 3 DHA(320)      . Probiotic Product (PROBIOTIC DAILY PO) Take by mouth. metaflora IB      . Vitamin D, Ergocalciferol, (DRISDOL) 50000 UNITS CAPS capsule Take 1 capsule (50,000  Units total) by mouth every 14 (fourteen) days. On mondays  26 capsule  1  . Zinc 22.5 MG TABS Take 22.5 mg by mouth daily.       No current facility-administered medications for this visit.    ROS: Pertinent items are noted in HPI.  Exam:    Ht 5' 4.5" (1.638 m)  Wt 131 lb (59.421 kg)  BMI 22.15 kg/m2  LMP 05/24/2006 Weight change: @WEIGHTCHANGE @ Last 3 height recordings:  Ht Readings from Last 3 Encounters:  12/14/13 5' 4.5" (1.638 m)  12/12/12 5' 4.5" (1.638 m)  07/24/12 5' 4.75" (1.645 m)   General appearance: alert, cooperative and appears stated age Head: Normocephalic, without obvious abnormality, atraumatic Neck: no adenopathy, no carotid bruit, no JVD, supple, symmetrical, trachea midline and thyroid not enlarged, symmetric, no tenderness/mass/nodules Lungs: clear to auscultation bilaterally Breasts: normal appearance, no masses or tenderness Heart: regular rate and rhythm, S1, S2 normal, no murmur, click, rub or gallop Abdomen: soft, non-tender; bowel sounds normal; no masses,  no organomegaly Extremities: extremities normal, atraumatic, no cyanosis or edema Skin: Skin color, texture, turgor normal. No rashes or lesions Lymph nodes: Cervical, supraclavicular, and axillary nodes normal. no inguinal nodes palpated Neurologic: Grossly normal   Pelvic: External genitalia:  atrophic appearance              Urethra: normal appearing urethra with no masses, tenderness or lesions              Bartholins and Skenes: normal                 Vagina: atrophic              Cervix: normal appearance and cyst on anterior lip              Pap taken: No.        Bimanual Exam:  Uterus:  uterus is normal size, shape, consistency and nontender                                      Adnexa:    no masses                                      Rectovaginal: Confirms                                      Anus:  normal sphincter tone, no  lesions     1. Routine gynecological examination pap  smear due 2016 counseled on breast self exam, mammography screening, osteoporosis return annually or prn Discussed PAP guideline changes, importance of weight bearing exercises, calcium, vit D and balanced diet.  2. Laboratory examination ordered as part of a routine general medical examination  - POCT Urinalysis Dipstick - Hemoglobin, fingerstick  3. Unspecified vitamin D deficiency Reviewed prior results, guidelines reviewed, will send refill after lab back, has enough - Vit D  25 hydroxy (rtn osteoporosis monitoring)  4. Screening for lipid disorders Will return fasting - Lipid panel; Future - Comprehensive metabolic panel; Future  5. Estrogen deficiency Normal DEXA 2010, repeat - DG Bone Density; Future  An After Visit Summary was printed and given to the patient.

## 2013-12-15 DIAGNOSIS — Z9049 Acquired absence of other specified parts of digestive tract: Secondary | ICD-10-CM | POA: Insufficient documentation

## 2013-12-15 LAB — VITAMIN D 25 HYDROXY (VIT D DEFICIENCY, FRACTURES): Vit D, 25-Hydroxy: 45 ng/mL (ref 30–89)

## 2013-12-17 MED ORDER — VITAMIN D (ERGOCALCIFEROL) 1.25 MG (50000 UNIT) PO CAPS: 50000.0000 [IU] | ORAL_CAPSULE | ORAL | Status: DC

## 2013-12-17 NOTE — Addendum Note (Signed)
Addended by: Elveria Rising on: 12/17/2013 02:57 PM   Modules accepted: Orders

## 2013-12-21 ENCOUNTER — Other Ambulatory Visit (INDEPENDENT_AMBULATORY_CARE_PROVIDER_SITE_OTHER): Payer: Self-pay | Admitting: Otolaryngology

## 2013-12-21 DIAGNOSIS — J320 Chronic maxillary sinusitis: Secondary | ICD-10-CM

## 2013-12-25 ENCOUNTER — Ambulatory Visit
Admission: RE | Admit: 2013-12-25 | Discharge: 2013-12-25 | Disposition: A | Discharge status: Home / Self Care | Payer: 59 | Source: Ambulatory Visit | Attending: Otolaryngology | Admitting: Otolaryngology

## 2013-12-25 DIAGNOSIS — J320 Chronic maxillary sinusitis: Secondary | ICD-10-CM

## 2014-02-14 ENCOUNTER — Ambulatory Visit (INDEPENDENT_AMBULATORY_CARE_PROVIDER_SITE_OTHER): Payer: 59 | Admitting: Family Medicine

## 2014-02-14 ENCOUNTER — Encounter: Payer: Self-pay | Admitting: Family Medicine

## 2014-02-14 ENCOUNTER — Telehealth: Payer: Self-pay

## 2014-02-14 VITALS — BP 130/72 | HR 72 | Temp 99.0°F | Ht 64.5 in | Wt 127.0 lb

## 2014-02-14 DIAGNOSIS — K644 Residual hemorrhoidal skin tags: Secondary | ICD-10-CM

## 2014-02-14 DIAGNOSIS — R319 Hematuria, unspecified: Secondary | ICD-10-CM

## 2014-02-14 DIAGNOSIS — N390 Urinary tract infection, site not specified: Secondary | ICD-10-CM

## 2014-02-14 LAB — POCT URINALYSIS DIPSTICK
Bilirubin, UA: NEGATIVE
Blood, UA: NEGATIVE
Glucose, UA: NEGATIVE
Leukocytes, UA: NEGATIVE
Nitrite, UA: NEGATIVE
Protein, UA: NEGATIVE
Spec Grav, UA: <=1.005
Urobilinogen, UA: NEGATIVE
pH, UA: 6

## 2014-02-14 MED ORDER — CIPROFLOXACIN HCL 250 MG PO TABS: 250.0000 mg | ORAL_TABLET | Freq: Two times a day (BID) | ORAL | Status: DC

## 2014-02-14 NOTE — Telephone Encounter (Signed)
Spoke with patient. Patient states that last night with using the restroom she blood on the tissue. Patient denies urinary frequency, pressure, burning, fevers, and back pain. Patient does have abdominal discomfort. States that she is just not feeling well. Advised patient will have to check with scheduling and give her a call back. Patient is agreeable and verbalizes understanding.

## 2014-02-14 NOTE — Telephone Encounter (Signed)
Spoke with patient. Patient states that blood was in the toilet bowl but not on the tissue when she wiped. Advised patient that we do not have any available appointments today but patient will need evaluation. Advised patient to call PCP to see if she can be seen or to be seen at local urgent care. Patient is agreeable. Advised if urine is tested and is negative and notices bleeding on toilet tissue to give [place] a call to schedule an appointment with Dr.Lathrop. Patient is agreeable and understands the importance of being seen.  Routing to [place] as covering Cc: Dr.Lathrop  Routing to provider for final review. Patient agreeable to disposition. Will close encounter

## 2014-02-14 NOTE — Patient Instructions (Signed)
I'm treating you for suspected urinary tract infection (based on blood in the urine, low grade fever). It is possible that beets can also change the color of the urine. Stop eating beets. We will call with your culture results by Monday (or notify you through [place]). Take the antibiotic twice daily--and if there is an infection, you should feel better in the next 2-3 days.  Use Anusol-HC (over-the-counter) as needed for external hemorrhoids.   Urinary Tract Infection Urinary tract infections (UTIs) can develop anywhere along your urinary tract. Your urinary tract is your body's drainage system for removing wastes and extra water. Your urinary tract includes two kidneys, two ureters, a bladder, and a urethra. Your kidneys are a pair of bean-shaped organs. Each kidney is about the size of your fist. They are located below your ribs, one on each side of your spine. CAUSES Infections are caused by microbes, which are microscopic organisms, including fungi, viruses, and bacteria. These organisms are so small that they can only be seen through a microscope. Bacteria are the microbes that most commonly cause UTIs. SYMPTOMS  Symptoms of UTIs may vary by age and gender of the patient and by the location of the infection. Symptoms in young women typically include a frequent and intense urge to urinate and a painful, burning feeling in the bladder or urethra during urination. Older women and men are more likely to be tired, shaky, and weak and have muscle aches and abdominal pain. A fever may mean the infection is in your kidneys. Other symptoms of a kidney infection include pain in your back or sides below the ribs, nausea, and vomiting. DIAGNOSIS To diagnose a UTI, your caregiver will ask you about your symptoms. Your caregiver also will ask to provide a urine sample. The urine sample will be tested for bacteria and white blood cells. White blood cells are made by your body to help fight  infection. TREATMENT  Typically, UTIs can be treated with medication. Because most UTIs are caused by a bacterial infection, they usually can be treated with the use of antibiotics. The choice of antibiotic and length of treatment depend on your symptoms and the type of bacteria causing your infection. HOME CARE INSTRUCTIONS  If you were prescribed antibiotics, take them exactly as your caregiver instructs you. Finish the medication even if you feel better after you have only taken some of the medication.  Drink enough water and fluids to keep your urine clear or pale yellow.  Avoid caffeine, tea, and carbonated beverages. They tend to irritate your bladder.  Empty your bladder often. Avoid holding urine for long periods of time.  Empty your bladder before and after sexual intercourse.  After a bowel movement, women should cleanse from front to back. Use each tissue only once. SEEK MEDICAL CARE IF:   You have back pain.  You develop a fever.  Your symptoms do not begin to resolve within 3 days. SEEK IMMEDIATE MEDICAL CARE IF:   You have severe back pain or lower abdominal pain.  You develop chills.  You have nausea or vomiting.  You have continued burning or discomfort with urination. MAKE SURE YOU:   Understand these instructions.  Will watch your condition.  Will get help right away if you are not doing well or get worse. Document Released: 02/17/2005 Document Revised: 11/09/2011 Document Reviewed: 06/18/2011 [facility] Patient Information 2015 [place], [place]. This information is not intended to replace advice given to you by your health care provider. Make sure you  discuss any questions you have with your health care provider.  Hemorrhoids Hemorrhoids are swollen veins around the rectum or anus. There are two types of hemorrhoids:   Internal hemorrhoids. These occur in the veins just inside the rectum. They may poke through to the outside and become irritated and  painful.  External hemorrhoids. These occur in the veins outside the anus and can be felt as a painful swelling or hard lump near the anus. CAUSES  Pregnancy.   Obesity.   Constipation or diarrhea.   Straining to have a bowel movement.   Sitting for long periods on the toilet.  Heavy lifting or other activity that caused you to strain.  Anal intercourse. SYMPTOMS   Pain.   Anal itching or irritation.   Rectal bleeding.   Fecal leakage.   Anal swelling.   One or more lumps around the anus.  DIAGNOSIS  Your caregiver may be able to diagnose hemorrhoids by visual examination. Other examinations or tests that may be performed include:   Examination of the rectal area with a gloved hand (digital rectal exam).   Examination of anal canal using a small tube (scope).   A blood test if you have lost a significant amount of blood.  A test to look inside the colon (sigmoidoscopy or colonoscopy). TREATMENT Most hemorrhoids can be treated at home. However, if symptoms do not seem to be getting better or if you have a lot of rectal bleeding, your caregiver may perform a procedure to help make the hemorrhoids get smaller or remove them completely. Possible treatments include:   Placing a rubber band at the base of the hemorrhoid to cut off the circulation (rubber band ligation).   Injecting a chemical to shrink the hemorrhoid (sclerotherapy).   Using a tool to burn the hemorrhoid (infrared light therapy).   Surgically removing the hemorrhoid (hemorrhoidectomy).   Stapling the hemorrhoid to block blood flow to the tissue (hemorrhoid stapling).  HOME CARE INSTRUCTIONS   Eat foods with fiber, such as whole grains, beans, nuts, fruits, and vegetables. Ask your doctor about taking products with added fiber in them (fibersupplements).  Increase fluid intake. Drink enough water and fluids to keep your urine clear or pale yellow.   Exercise regularly.   Go to  the bathroom when you have the urge to have a bowel movement. Do not wait.   Avoid straining to have bowel movements.   Keep the anal area dry and clean. Use wet toilet paper or moist towelettes after a bowel movement.   Medicated creams and suppositories may be used or applied as directed.   Only take over-the-counter or prescription medicines as directed by your caregiver.   Take warm sitz baths for 15-20 minutes, 3-4 times a day to ease pain and discomfort.   Place ice packs on the hemorrhoids if they are tender and swollen. Using ice packs between sitz baths may be helpful.   Put ice in a plastic bag.   Place a towel between your skin and the bag.   Leave the ice on for 15-20 minutes, 3-4 times a day.   Do not use a donut-shaped pillow or sit on the toilet for long periods. This increases blood pooling and pain.  SEEK MEDICAL CARE IF:  You have increasing pain and swelling that is not controlled by treatment or medicine.  You have uncontrolled bleeding.  You have difficulty or you are unable to have a bowel movement.  You have pain or inflammation outside  the area of the hemorrhoids. MAKE SURE YOU:  Understand these instructions.  Will watch your condition.  Will get help right away if you are not doing well or get worse. Document Released: 05/07/2000 Document Revised: 04/26/2012 Document Reviewed: 03/14/2012 [facility] Patient Information 2015 [place], [place]. This information is not intended to replace advice given to you by your health care provider. Make sure you discuss any questions you have with your health care provider.

## 2014-02-14 NOTE — Progress Notes (Signed)
Chief Complaint  Patient presents with  . Hematuria    voided 2 hrs ago and there was visible blood in her urine, no symptoms. Also had some yesterday. Does have celiac, states that her "butt does hurt a little bit." Also would like to have a flu vaccine and a shingles shot.    2 days ago she first noted blood in her urine--tinge of red, noted with every void since then. This has persisted through today.  She isn't having any urgency/frequency/dysuria.  She doesn't have h/o bladder infections or kidney stones.  She last voided 2 hours ago (and took a picture of the toilet bowl--see under physical).  She has been eating some beets.  Hasn't noted any changes in her stool color. She is complaining of a little discomfort in her lower abdomen.  She is complaining about her bottom hurting, since she was on vacation.  She had diarrhea while on vacation (had too much wine and gluten-free cake, thinks from having too much sugar), and pain/discomfort started after this.  She has never noticed any blood on the toilet paper, after wiping after voiding or after a bowel movement.  There is no bloody staining of underwear.  Doesn't see blood in the stool.  Sees blood in toilet whether she has a bowel movement or not.  She has been eating beets in her diet.  Previous episode of "butt pain" last month after dietary indiscretion--got better after making some changes in her diet.  Denies any flank pain, nausea, vomiting. She is feeling very fatigued today.  No known fevers, chills  She is also asking about shingles vaccine.  Past Medical History  Diagnosis Date  . Asthma   . ADD (attention deficit disorder)   . RBBB (right bundle branch block)   . Allergy     RHINITIS  . History of colonic polyps   . Asthma 10/18/2011  . ADD (attention deficit disorder with hyperactivity) 10/18/2011  . Ileus following gastrointestinal surgery 10/18/2011  . Malnutrition following gastrointestinal surgery 10/18/2011  . Celiac  disease   . Depression   . Gluten free diet     allergic to gluten   Past Surgical History  Procedure Laterality Date  . Tonsillectomy and adenoidectomy    . Cystectomy      LEFT FOOT, RIGHT HAND  . Colonoscopy w/ polypectomy    . Laparotomy  10/07/2011    Procedure: EXPLORATORY LAPAROTOMY;  Surgeon: Odis Hollingshead, MD;  Location: [facility];  Service: General;  Laterality: N/A;  hartmann procedure and creation of colostomy  . Upper gastrointestinal endoscopy  01/19/12  . Colon surgery    . Colostomy takedown  05/29/2012    Procedure: LAPAROSCOPIC COLOSTOMY TAKEDOWN;  Surgeon: Odis Hollingshead, MD;  Location: [facility];  Service: General;  Laterality: N/A;  Laparoscopic Assisted Colostomy Closure  with proctoscopy  . Tubal ligation    . Foot surgery     History   Social History  . Marital Status: Married    Spouse Name: N/A    Number of Children: N/A  . Years of Education: N/A   Occupational History  . Not on file.   Social History Main Topics  . Smoking status: Never Smoker   . Smokeless tobacco: Never Used  . Alcohol Use: 2.5 oz/week    5 drink(s) per week     Comment: 1-3 glasses of wine a week or gluten free beer  . Drug Use: No  . Sexual Activity: Yes  Partners: Male    Patent examiner Protection: Post-menopausal   Other Topics Concern  . Not on file   Social History Narrative   Married, lives with husband and son Jenny Reichmann.  [employer] ([employer]).  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 ADVAIR DISKUS 100-50 MCG/DOSE AEPB  12/14/2013: Received from: External Pharmacy  . B Complex-Biotin-FA (B-COMPLEX PO) Take 100 mg by mouth daily.   Marland Kitchen Bioflavonoid Products (C COMPLEX PO) Take 500 mg by mouth daily.   . cetirizine (ZYRTEC) 10 MG tablet Take 10 mg by mouth daily as needed. For cold or laxative   . FLUoxetine (PROZAC) 10 MG tablet Take 30 mg by mouth 3 (three) times daily.    . fluticasone  (FLONASE) 50 MCG/ACT nasal spray Place 2 sprays into the nose daily.    . multivitamin-lutein (OCUVITE-LUTEIN) CAPS Take 1 capsule by mouth daily.   Marland Kitchen OVER THE COUNTER MEDICATION Take 1 capsule by mouth 2 (two) times daily. Algae omega 3 DHA(320)   . Probiotic Product (PROBIOTIC DAILY PO) Take by mouth. metaflora IB   . Vitamin D, Ergocalciferol, (DRISDOL) 50000 UNITS CAPS capsule Take 1 capsule (50,000 Units total) by mouth every 14 (fourteen) days. On mondays   . Zinc 22.5 MG TABS Take 22.5 mg by mouth daily.   Marland Kitchen EPINEPHrine (EPI-PEN) 0.3 mg/0.3 mL DEVI Inject 0.3 mg into the muscle once. For previous allergies, maybe gluten   . [DISCONTINUED] benzonatate (TESSALON) 200 MG capsule as needed. 12/14/2013: Received from: External Pharmacy Received Sig:   . [DISCONTINUED] levofloxacin (LEVAQUIN) 500 MG tablet Take 500 mg by mouth daily.    Allergies  Allergen Reactions  . Sulfonamide Derivatives Rash    Arms only.    ROS:  No fevers, chills, nausea, vomiting.  No recent bowel changes (diarrhea with recent vacation).  +rectal pain.  No URI symptoms, chest pain, shortness of breath, chest pain, bruising, rashes, other abnormal bleeding.  No vaginal discharge, odor, itch.  Moods are good.  See HPI  PHYSICAL EXAM: BP 130/72  Pulse 72  Temp(Src) 99 F (37.2 C) (Tympanic)  Ht 5' 4.5" (1.638 m)  Wt 127 lb (57.607 kg)  BMI 21.47 kg/m2  LMP 05/24/2006 Well developed, pleasant female in no distress HEENT:  EOMI, conjunctiva clear.  Mucus membranes moist, normal Neck: no lymphadenopathy, thyromegaly or mass Abdomen: very mild suprapubic tenderness. No organomegaly or mass Pelvic--normal external genitalia without lesions or rashes. Bimanual exam was normal--normal uterus, adnexa, nontender, no masses.  No abnormal discharge or bleeding Rectal--external hemorrhoids x 3, the one on the right is inflamed. Heme negative brown stool  Urine dip:  TG < 1.005.  2+ ketones, negative nitrite, leuks,  blood. Pictures on her phone of toilet--pink tinged urine--seems to be settled at bottom of toilet (but no visible clots), with edges seeming yellow.  ASSESSMENT/PLAN  Hematuria - not confirmed on today's urine, but VERY dilute.  Consider beet-uria as cause if culture negative (then return for re-eval if persists after not eating beets) - Plan: Urine culture  Blood in urine - Plan: POCT Urinalysis Dipstick  External hemorrhoid  Urinary tract infection, site not specified - suspected based on suprapubic tenderness and hx blood in urine.  treat presumptively while waiting on culture - Plan: ciprofloxacin (CIPRO) 250 MG tablet  She elects to hold off on vaccines today (upcoming trip, lowgrade fever) Schedule NV to have these done (at the same time)  Risks/benefits/side  effects of zostavax reviewed in detail, and all questions answered.  25 min visit, more than 1/2 spent counseling.

## 2014-02-15 NOTE — Telephone Encounter (Signed)
Please call pt.  I can see note from Dr. Tomi Bamberger.  If culture ends up being negative, I will want her to come in for a PUS just to check endometrial thickness and make sure everything looks ok.  Thanks.

## 2014-02-15 NOTE — Telephone Encounter (Addendum)
Spoke with patient. Patient states that she started antibiotic yesterday and feels much better. Patient will call us to let us know of urine culture results. Understands if negative Dr.Miller will like to schedule a PUS. Patient is agreeable.  Routing to Dr.Miller Cc: Dr.Lathrop  Routing to provider for final review. Patient agreeable to disposition. Will close encounter

## 2014-02-16 LAB — URINE CULTURE
Colony Count: NO GROWTH
Organism ID, Bacteria: NO GROWTH

## 2014-02-21 ENCOUNTER — Telehealth: Payer: Self-pay | Admitting: *Deleted

## 2014-02-21 NOTE — Telephone Encounter (Signed)
See previous phone notes. Patient called last week with questionable vaginal bleeding versus blood in urine. PCP did urine culture. Negative results noted in EPIC. Call to patient. She states she thinks she has determined that the cause of dark urine is from increased dietary intake of beets making urine dark red. Happened again yesterday after eating beets. Advised MD reviews all calls and that bleeding in menopausal female without clearly identifiable cause should be evalauted to ensure is not coming from endometrium. Recommend OV to review with MD. Patient agrees but will have to wait till next week because she is "covered up." OV 02-27-14 at 1030 with Dr Charlies Constable, declined earlier appointment. Advised to take Motrin 835m 1 hour prior with food in case she and MD decide to proceed with biopsy of lining of uterus. Patient agreeable.  Routing to provider for final review. Patient agreeable to disposition. Will close encounter

## 2014-02-22 ENCOUNTER — Other Ambulatory Visit: Payer: 59

## 2014-02-25 ENCOUNTER — Other Ambulatory Visit (INDEPENDENT_AMBULATORY_CARE_PROVIDER_SITE_OTHER): Payer: 59

## 2014-02-25 DIAGNOSIS — Z23 Encounter for immunization: Secondary | ICD-10-CM

## 2014-02-27 ENCOUNTER — Ambulatory Visit (INDEPENDENT_AMBULATORY_CARE_PROVIDER_SITE_OTHER): Payer: 59 | Admitting: Gynecology

## 2014-02-27 ENCOUNTER — Encounter: Payer: Self-pay | Admitting: Gynecology

## 2014-02-27 VITALS — BP 94/60 | HR 64 | Temp 98.0°F | Resp 18 | Wt 125.0 lb

## 2014-02-27 DIAGNOSIS — N950 Postmenopausal bleeding: Secondary | ICD-10-CM

## 2014-02-27 DIAGNOSIS — K645 Perianal venous thrombosis: Secondary | ICD-10-CM

## 2014-02-27 DIAGNOSIS — E559 Vitamin D deficiency, unspecified: Secondary | ICD-10-CM

## 2014-02-27 MED ORDER — VITAMIN D (ERGOCALCIFEROL) 1.25 MG (50000 UNIT) PO CAPS: 50000.0000 [IU] | ORAL_CAPSULE | ORAL | Status: DC

## 2014-02-27 MED ORDER — LIDOCAINE HCL 2 % EX GEL: 1.0000 "application " | CUTANEOUS | Status: DC | PRN

## 2014-02-27 MED ORDER — HYDROCORTISONE ACETATE 25 MG RE SUPP: 25.0000 mg | Freq: Two times a day (BID) | RECTAL | Status: DC

## 2014-02-27 NOTE — Progress Notes (Signed)
Subjective:     Patient ID: Mary Leach, female   DOB: 03-05-1951, 63 y.o.   MRN: [id number]  HPI Comments: Pt was seen by [facility] for f/u of her celiac disease and was doing better.  Pt was thenseen by PCP for questionable UTI, hematuria but negative culture.  Pt was noticed to have a hemorrhoid.  Pt had diarrhea 01/11/14.  Pt reports that she noticed blood around stool this morning, no straining.   Pt denies vaginal bleeding, no blood on underwear, or when wipes after voiding.   No recent sex. Pt has lost weight since starting new diet for celiac disease.    Review of Systems Per hpi    Objective:   Physical Exam  Nursing note and vitals reviewed. Constitutional: She appears well-developed and well-nourished.  Genitourinary: Uterus normal. Rectal exam shows external hemorrhoid, internal hemorrhoid, fissure and tenderness. There is no rash, tenderness or lesion on the right labia. There is no rash, tenderness or lesion on the left labia. Uterus is not tender. Cervix exhibits no motion tenderness and no discharge. Right adnexum displays no mass. Left adnexum displays no mass. No erythema, tenderness or bleeding around the vagina. No vaginal discharge found.  Thrombus noted at 9 o'clock and prolapsed hemorrhagic hemorrhoid noted at 10   Lymphadenopathy:       Right: No inguinal adenopathy present.       Left: No inguinal adenopathy present.       Assessment:     Bleeding and thrombosed hemorrhoids PMB     Plan:     Suspect all bleeding is related to hemorrhoids As not mentioned previously, will get PUS to confirm lining EMB if indicated Pt agrees to plan anusol suppositories and lidocaine for comfort

## 2014-03-04 ENCOUNTER — Telehealth: Payer: Self-pay | Admitting: Gynecology

## 2014-03-04 ENCOUNTER — Encounter: Payer: Self-pay | Admitting: Family Medicine

## 2014-03-04 NOTE — Telephone Encounter (Signed)
Spoke with patient. Advised that per benefit quote received, she will be responsible for $30 copay when she comes in for PUS. Patient agreeable. Scheduled PUS. Advised patient of 72 hour cancellation policy and $504 cancellation fee. Patient agreeable.

## 2014-03-15 ENCOUNTER — Telehealth: Payer: Self-pay | Admitting: Gynecology

## 2014-03-15 NOTE — Telephone Encounter (Signed)
I called and spoke with the patient to verify she really wanted to cancel her PUS for 03/19/14 with Dr. Charlies Constable. She did want to cancel at this time and the reschedule button was pressed at her reminder call at 8:33 AM on 03/14/14. The patient stated she will call back to reschedule. No [facility] fee charged as reminder call cancellation was 72 hours prior to the appointment.

## 2014-03-19 ENCOUNTER — Other Ambulatory Visit: Payer: 59

## 2014-03-19 ENCOUNTER — Other Ambulatory Visit: Payer: 59 | Admitting: Gynecology

## 2014-03-19 NOTE — Telephone Encounter (Signed)
Spoke with patient. Rescheduled PUS. Reminded of $30 copay. Advised patient of 72 hour cancellation policy and $676 cancellation fee. Patient agreeable.

## 2014-03-20 ENCOUNTER — Telehealth: Payer: Self-pay | Admitting: Gynecology

## 2014-03-20 NOTE — Telephone Encounter (Signed)
Left message for patient to call back. Need to reschedule PUS from 11.03.2015 to 11.05.2015

## 2014-03-22 NOTE — Telephone Encounter (Signed)
Spoke with patient. Advised that Dr Charlies Constable is no longer with our practice and that I need to reschedule PUS from 11.03 to 11.05. Patient agreeable. Rescheduled PUS.

## 2014-03-25 ENCOUNTER — Encounter: Payer: Self-pay | Admitting: Gynecology

## 2014-03-26 ENCOUNTER — Other Ambulatory Visit: Payer: 59

## 2014-03-26 ENCOUNTER — Other Ambulatory Visit: Payer: 59 | Admitting: Gynecology

## 2014-03-28 ENCOUNTER — Ambulatory Visit (INDEPENDENT_AMBULATORY_CARE_PROVIDER_SITE_OTHER): Payer: 59 | Admitting: Obstetrics & Gynecology

## 2014-03-28 ENCOUNTER — Ambulatory Visit (INDEPENDENT_AMBULATORY_CARE_PROVIDER_SITE_OTHER): Payer: 59

## 2014-03-28 DIAGNOSIS — N950 Postmenopausal bleeding: Secondary | ICD-10-CM

## 2014-03-28 DIAGNOSIS — N939 Abnormal uterine and vaginal bleeding, unspecified: Secondary | ICD-10-CM

## 2014-03-28 NOTE — Progress Notes (Signed)
63 y.o.Marriedfemale here for a pelvic ultrasound.  Pt with hx of celiac disease and recent issue with thrombosed hemorrhoid.  Saw some bleeding that she wasn't sure was vaginal.  D/W pt Lathrop on 02/27/14.  PUS was scheduled and if endometrium was thickened, then biopsy would be planned.  Plan discussed with pt and she has clear understanding.  Patient's last menstrual period was 05/24/2006.  Sexually active:  no  Contraception: bilateral tubal ligation  FINDINGS: UTERUS: 5.8 x 4.6 x 2.4cm EMS: 1.60mm ADNEXA:   Left ovary 1.6 s 1.7 x 1.1cm   Right ovary 1.4 x 1.2 x 0.7cm.  Evaluation of ovaries difficult due to bowel peristalsis. CUL DE SAC: no free fluid  Images reviewed with pt personally.  As lining less then 55mm, do not feel endometrial biopsy is indicated, specifically since she isn't sure she had vaginal bleeding in the first place.  Pt aware, if happens again, she needs to call to be seen and if vaginal bleeding confirmed, biopsy would be obtained.  Pt in agreement with plan.  All questions answered.    Assessment:  Vaginal bleeding (?) and normal PMP findings on ultrasound Significant peristalsis of bowel making examination of ovaries more difficulty. Celiac disease Plan: Pt will call if has additional bleeding for biopsy at that time.  For now, will just watch conservatively.  Pt will follow up 2016 for AEX.  ~15 minutes spent with patient >50% of time was in face to face discussion of above.

## 2014-04-14 ENCOUNTER — Encounter: Payer: Self-pay | Admitting: Obstetrics & Gynecology

## 2014-06-05 ENCOUNTER — Other Ambulatory Visit: Payer: Self-pay

## 2014-06-05 DIAGNOSIS — Z1231 Encounter for screening mammogram for malignant neoplasm of breast: Secondary | ICD-10-CM

## 2014-06-19 ENCOUNTER — Ambulatory Visit
Admission: RE | Admit: 2014-06-19 | Discharge: 2014-06-19 | Disposition: A | Discharge status: Home / Self Care | Payer: 59 | Source: Ambulatory Visit

## 2014-06-19 ENCOUNTER — Ambulatory Visit
Admission: RE | Admit: 2014-06-19 | Discharge: 2014-06-19 | Disposition: A | Discharge status: Home / Self Care | Payer: 59 | Source: Ambulatory Visit | Attending: Gynecology | Admitting: Gynecology

## 2014-06-19 DIAGNOSIS — E2839 Other primary ovarian failure: Secondary | ICD-10-CM

## 2014-06-19 DIAGNOSIS — Z1231 Encounter for screening mammogram for malignant neoplasm of breast: Secondary | ICD-10-CM

## 2014-09-16 ENCOUNTER — Encounter: Payer: Self-pay | Admitting: Family Medicine

## 2014-09-16 ENCOUNTER — Ambulatory Visit (INDEPENDENT_AMBULATORY_CARE_PROVIDER_SITE_OTHER): Payer: 59 | Admitting: Family Medicine

## 2014-09-16 VITALS — BP 122/74 | HR 64 | Temp 98.0°F | Ht 64.5 in | Wt 128.8 lb

## 2014-09-16 DIAGNOSIS — J069 Acute upper respiratory infection, unspecified: Secondary | ICD-10-CM

## 2014-09-16 NOTE — Patient Instructions (Signed)
  Drink plenty of fluids. Use tylenol or ibuprofen as needed for pain, headache, fever Continue to use your allergy medications and Neti-pot. Add Guaifenesin (ie Mucinex) to help loosen the phlegm, and use a DM version (dextromethorphan) vs getting a separate Delsym syrup to use only if needed as a cough suppressant.  Look at your OTC medications to ensure that you aren't getting duplicate ingredients (ie dextromethorphan in Nyquil and others)  Call or return if your sinus drainage becomes discolored, ongoing fevers, worsening symptoms, rather than improving over the next 3-5 days.    Currently there is no evidence of a bacterial infection (just viral); your asthma seems well controlled.

## 2014-09-16 NOTE — Progress Notes (Signed)
Chief Complaint  Patient presents with  . Cough    startes this past Saturday, states he has bad allergies. Thinks she has a fever this past Saturday. Slept all day yesterday and 101.1 temp-broke last night 9:00. Mucus is clear.    Allergies have been flaring. She saw Dr. Benjamine Mola the end of March and was treated with Levaquin for a sinus infection.  Symptoms resolved, but had some ongoing headaches. She was then treated with rx ibuprofen, and headaches resolved.  She then flew to [place] this past weekend.  She was outside all day on Friday, started with some postnasal drainage, throat-clearing.  The following day (2 days ago), she woke up "not feeling good"--she was slow, tired, runny nose, slight cough.  She woke up 2:45 Sunday afternoon (slept a long time after travel back) and woke up with temp of 101.  Today she has ongoing cough, head congestion.  Nasal mucus is clear, cough is nonproductive (but feels like it might start getting up phlegm).   +myalgias on Friday, Saturday, and some yesterday, better today.  Asthma has been stable, not needing any albuterol.  She has taken Nyquil and zyrtec, no other OTC medication for this illness.  Denies ear pain, sore throat.  She is having sinus pain diffusely--across the forehead and both cheeks.    Denies sick contacts, but recent plane travel.  PMH, PSH, SH reviewed.  Outpatient Encounter Prescriptions as of 09/16/2014  Medication Sig Note  . ADVAIR DISKUS 100-50 MCG/DOSE AEPB Inhale 1 puff into the lungs 2 (two) times daily.  12/14/2013: Received from: External Pharmacy  . Azelastine-Fluticasone 137-50 MCG/ACT SUSP Place 2 sprays into the nose daily.   . B Complex-Biotin-FA (B-COMPLEX PO) Take 100 mg by mouth daily.   Marland Kitchen Bioflavonoid Products (C COMPLEX PO) Take 500 mg by mouth daily.   . cetirizine (ZYRTEC) 10 MG tablet Take 10 mg by mouth daily as needed. For cold or laxative   . FLUoxetine (PROZAC) 10 MG tablet Take 30 mg by mouth 3 (three) times  daily.    . multivitamin-lutein (OCUVITE-LUTEIN) CAPS Take 1 capsule by mouth daily.   Marland Kitchen OVER THE COUNTER MEDICATION Take 1 capsule by mouth 2 (two) times daily. Algae omega 3 DHA(320)   . Probiotic Product (PROBIOTIC DAILY PO) Take by mouth. metaflora IB   . Vitamin D, Ergocalciferol, (DRISDOL) 50000 UNITS CAPS capsule Take 1 capsule (50,000 Units total) by mouth every 14 (fourteen) days. On mondays   . Zinc 22.5 MG TABS Take 22.5 mg by mouth daily.   Marland Kitchen EPINEPHrine (EPI-PEN) 0.3 mg/0.3 mL DEVI Inject 0.3 mg into the muscle once. For previous allergies, maybe gluten   . hydrocortisone (ANUSOL-HC) 25 MG suppository Place 1 suppository (25 mg total) rectally 2 (two) times daily. (Patient not taking: Reported on 09/16/2014)   . lidocaine (XYLOCAINE JELLY) 2 % jelly Apply 1 application topically as needed. (Patient not taking: Reported on 09/16/2014)   . [DISCONTINUED] fluticasone (FLONASE) 50 MCG/ACT nasal spray Place 2 sprays into the nose daily.     Also taking Biotin  Allergies  Allergen Reactions  . Sulfonamide Derivatives Rash    Arms only.   ROS:  +fever and URI symptoms as per HPI.  No chest pain, palpitations, shortness of breath, nausea, vomiting, diarrhea, urinary complaints, joint pains, bleeding, bruising, rash.  Myalgias have improved.  See HPI.  PHYSICAL EXAM: BP 122/74 mmHg  Pulse 64  Temp(Src) 98 F (36.7 C) (Tympanic)  Ht 5' 4.5" (1.638 m)  Wt 128 lb 12.8 oz (58.423 kg)  BMI 21.77 kg/m2  LMP 05/24/2006 Well developed, well-appearing female in no distress HEENT: PERRL, EOMI, conjunctiva clear.  TM's and EAC's normal. Nasal mucosa is pale, midly edematous, no purulence.  Mild sinus tenderness x 4.  OP is clear Neck: No lymphadenopathy, thyromegaly or mass Heart: regular rate and rhythm Lungs: clear bilaterally with good air movement.  No wheezes, rales, ronchi Skin: no rash Extremities: no edema Neuro: alert and oriented, cranial nerves intact, normal strength,  gait.  ASSESSMENT/PLAN:  Acute upper respiratory infection  No evidence of acute bacterial infection--viral syndrome/URI. Reviewed normal/expected course.  Drink plenty of fluids. Use tylenol or ibuprofen as needed for pain, headache, fever Continue to use your allergy medications and Neti-pot. Add Guaifenesin (ie Mucinex) to help loosen the phlegm, and use a DM version (dextromethorphan) vs getting a separate Delsym syrup to use only if needed as a cough suppressant.  Look at your OTC medications to ensure that you aren't getting duplicate ingredients (ie dextromethorphan in Nyquil and others)  Call or return if your sinus drainage becomes discolored, ongoing fevers, worsening symptoms, rather than improving over the next 3-5 days.    Currently there is no evidence of a bacterial infection (just viral); your asthma seems well controlled.

## 2014-09-23 ENCOUNTER — Ambulatory Visit (INDEPENDENT_AMBULATORY_CARE_PROVIDER_SITE_OTHER): Payer: 59 | Admitting: Family Medicine

## 2014-09-23 ENCOUNTER — Ambulatory Visit
Admission: RE | Admit: 2014-09-23 | Discharge: 2014-09-23 | Disposition: A | Discharge status: Home / Self Care | Payer: 59 | Source: Ambulatory Visit | Attending: Family Medicine | Admitting: Family Medicine

## 2014-09-23 ENCOUNTER — Encounter: Payer: Self-pay | Admitting: Family Medicine

## 2014-09-23 VITALS — BP 102/64 | HR 60 | Temp 97.4°F | Ht 64.5 in | Wt 126.4 lb

## 2014-09-23 DIAGNOSIS — R05 Cough: Secondary | ICD-10-CM | POA: Diagnosis not present

## 2014-09-23 DIAGNOSIS — J4520 Mild intermittent asthma, uncomplicated: Secondary | ICD-10-CM

## 2014-09-23 DIAGNOSIS — R059 Cough, unspecified: Secondary | ICD-10-CM

## 2014-09-23 DIAGNOSIS — J069 Acute upper respiratory infection, unspecified: Secondary | ICD-10-CM | POA: Diagnosis not present

## 2014-09-23 NOTE — Patient Instructions (Signed)
Go to [place] for chest x-ray ([address]) Let us know if you change your mind and would like to try tessalon for cough. Restart Mucinex.

## 2014-09-23 NOTE — Progress Notes (Signed)
Chief Complaint  Patient presents with  . Cough    URI has progressed into bad cough, started last Wed 09/18/14. Has had to cancel numerous plans. Had a few fevers last week-99.0 degrees- 101 degrees. Sleeping days away. Spent an awful lot of time her pj's. Doesn't feel well. Has seen think yellow strip coming out her nose when blowing. Has been sleeping close to 12 hrs a day.    She was seen 4/25 at which time it was felt that she had a viral URI.  She had head congestion, mucus was clear.  She used Mucinex as directed.  By middle-end of last week she had it move down into her chest, was coughing more.  Cough was nonproductive, but very hard--feels like there is something that she can't get up. She didn't think the mucinex helped, so she stopped using it.  Over this past weekend she still didn't feel well--poor energy.  Yesterday she slept all afternoon, plus has been sleeping 12 hours at night.   She had fevers last week, but no fever in the last 5 days, and she hasn't been taking any tylenol or NSAIDs. A couple of times she noticed a "thread of bright yellow" coming out of her nose.  Noticed slight green today.  Denies sinus pain.  Her head feels clearer today than it has in a long time. Achiness that she had last week has also resolved.  Today she finally feels well enough to start going through her emails.  She presents today due to ongoing cough, worried about pneumonia or bronchitis. No longer having headaches.  Denies shortness of breath, tight/wheezing.  Sometimes she feels a "catch" when she breathes in, which makes her cough.  Talk is no longer triggering cough like it had been doing last week.  She has Delsym syrup, but has only used it a few times--she is concerned because of her celiac, and the possibility of contamination (doesn't actually contain any gluten).  PMH, PSH, SH reviewed  Current Outpatient Prescriptions on File Prior to Visit  Medication Sig Dispense Refill  . ADVAIR  DISKUS 100-50 MCG/DOSE AEPB Inhale 1 puff into the lungs 2 (two) times daily.     . Azelastine-Fluticasone 137-50 MCG/ACT SUSP Place 2 sprays into the nose daily.    . B Complex-Biotin-FA (B-COMPLEX PO) Take 100 mg by mouth daily.    Marland Kitchen Bioflavonoid Products (C COMPLEX PO) Take 500 mg by mouth daily.    . cetirizine (ZYRTEC) 10 MG tablet Take 10 mg by mouth daily as needed. For cold or laxative    . FLUoxetine (PROZAC) 10 MG tablet Take 30 mg by mouth 3 (three) times daily.     . multivitamin-lutein (OCUVITE-LUTEIN) CAPS Take 1 capsule by mouth daily.    Marland Kitchen OVER THE COUNTER MEDICATION Take 1 capsule by mouth 2 (two) times daily. Algae omega 3 DHA(320)    . Probiotic Product (PROBIOTIC DAILY PO) Take by mouth. metaflora IB    . Vitamin D, Ergocalciferol, (DRISDOL) 50000 UNITS CAPS capsule Take 1 capsule (50,000 Units total) by mouth every 14 (fourteen) days. On mondays 26 capsule 1  . Zinc 22.5 MG TABS Take 22.5 mg by mouth daily.    Marland Kitchen EPINEPHrine (EPI-PEN) 0.3 mg/0.3 mL DEVI Inject 0.3 mg into the muscle once. For previous allergies, maybe gluten    . hydrocortisone (ANUSOL-HC) 25 MG suppository Place 1 suppository (25 mg total) rectally 2 (two) times daily. (Patient not taking: Reported on 09/16/2014) 12 suppository 1  .  lidocaine (XYLOCAINE JELLY) 2 % jelly Apply 1 application topically as needed. (Patient not taking: Reported on 09/16/2014) 5 mL 0   No current facility-administered medications on file prior to visit.   Allergies  Allergen Reactions  . Sulfonamide Derivatives Rash    Arms only.    ROS:  No nausea, vomiting, diarrhea, bleeding, bruising, rash. No shortness of breath, chest pain, headaches, dizziness, ear pain, sore throat, urinary complaints.  "woozy" a little last week, resolved.   PHYSICAL EXAM: BP 102/64 mmHg  Pulse 60  Temp(Src) 97.4 F (36.3 C) (Tympanic)  Ht 5' 4.5" (1.638 m)  Wt 126 lb 6.4 oz (57.335 kg)  BMI 21.37 kg/m2  LMP 05/24/2006 Well developed, pleasant  female, speaking easily, in no distress.  Occasionally had slightly wet-sounding deep cough. HEENT: PERRL, EOMI, conjunctiva clear.  TM's and EAC's normal. Nasal mucosa is mildly edematous, no erythema or purulence.  Sinuses nontender.  OP is clear Neck: no lymphadenopathy, thyromegaly or mass Heart: regular rate and rhythm Lungs:  Good air movement throughout. Some coarse breath sounds and ronchi at the right mid-lower lung field.  Improved after some deep breaths, but seemed to recur Neuro: alert and oriented, cranial nerves intact. Normal strength, gait Psych: mildly anxious Skin: no rash  ASSESSMENT/PLAN:  Cough - some ronchi--check CXR. restart mucinex; albuterol prn - Plan: DG Chest 2 View  Acute upper respiratory infection - seems to be following typical course, and improving  Asthma, mild intermittent, uncomplicated   She looked up tessalon on app in her phone re: risk for her celiac--can't guarantee that equipment used to make it doesn't have contamination with gluten (doesn't contain anything with gluten).  She therefore declines tessalon.  She will continue Delsym (since that hasn't bothered her), only if needed. Restart mucinex. Chest x-ray given findings on exam  F/u prn persistent/worsening symptoms, recurring fevers, shortness of breath or other concerns

## 2014-12-16 ENCOUNTER — Ambulatory Visit: Payer: 59 | Admitting: Obstetrics and Gynecology

## 2014-12-16 ENCOUNTER — Ambulatory Visit: Payer: 59 | Admitting: Gynecology

## 2014-12-17 ENCOUNTER — Telehealth: Payer: Self-pay | Admitting: Internal Medicine

## 2014-12-17 DIAGNOSIS — Z0279 Encounter for issue of other medical certificate: Secondary | ICD-10-CM

## 2014-12-17 NOTE — Telephone Encounter (Signed)
Faxed over medical records to [facility] @ [phone] today

## 2014-12-18 ENCOUNTER — Encounter: Payer: Self-pay | Admitting: Family Medicine

## 2014-12-18 ENCOUNTER — Ambulatory Visit (INDEPENDENT_AMBULATORY_CARE_PROVIDER_SITE_OTHER): Payer: Commercial Managed Care - HMO | Admitting: Family Medicine

## 2014-12-18 VITALS — BP 106/60 | HR 64 | Temp 98.7°F | Wt 124.0 lb

## 2014-12-18 DIAGNOSIS — J4520 Mild intermittent asthma, uncomplicated: Secondary | ICD-10-CM | POA: Diagnosis not present

## 2014-12-18 DIAGNOSIS — J0100 Acute maxillary sinusitis, unspecified: Secondary | ICD-10-CM | POA: Diagnosis not present

## 2014-12-18 DIAGNOSIS — J301 Allergic rhinitis due to pollen: Secondary | ICD-10-CM | POA: Diagnosis not present

## 2014-12-18 MED ORDER — AMOXICILLIN-POT CLAVULANATE 875-125 MG PO TABS: 1.0000 | ORAL_TABLET | Freq: Two times a day (BID) | ORAL | Status: DC

## 2014-12-18 NOTE — Progress Notes (Signed)
   Subjective:    Patient ID: Mary Leach, female    DOB: 1950/07/18, 64 y.o.   MRN: [id number]  HPI She noted the onset of postnasal drainage on July 16 as well as a slight cough and fatigue. The symptoms continued until approximately 3 days ago when she noted increasing fatigue, fever, chills, myalgias, cough that is now become productive as well as some maxillary sinus pain. She does not complain of shortness of breath or wheezing. She has a previous history of asthma as well as allergies. She is on medications listed in the chart. She does not smoke. In the past she has seen an allergist for her underlying allergy problems as well as asthma.   Review of Systems     Objective:   Physical Exam Alert and in no distress. Tympanic membranes and canals are normal. Pharyngeal area is normal. Nasal mucosa is red with tenderness over maxillary sinuses. Neck is supple without adenopathy or thyromegaly. Cardiac exam shows a regular sinus rhythm without murmurs or gallops. Lungs are clear to auscultation.        Assessment & Plan:  Acute maxillary sinusitis, recurrence not specified - Plan: amoxicillin-clavulanate (AUGMENTIN) 875-125 MG per tablet  Asthma, mild intermittent, uncomplicated  Allergic rhinitis due to pollen  she will continue on her present medication regimen and call if further trouble.

## 2014-12-26 ENCOUNTER — Encounter: Payer: Self-pay | Admitting: Obstetrics and Gynecology

## 2014-12-26 ENCOUNTER — Telehealth: Payer: Self-pay | Admitting: Family Medicine

## 2014-12-26 ENCOUNTER — Other Ambulatory Visit: Payer: Self-pay

## 2014-12-26 ENCOUNTER — Ambulatory Visit (INDEPENDENT_AMBULATORY_CARE_PROVIDER_SITE_OTHER): Payer: Commercial Managed Care - HMO | Admitting: Obstetrics and Gynecology

## 2014-12-26 VITALS — BP 120/74 | HR 56 | Resp 14 | Ht 64.0 in | Wt 126.0 lb

## 2014-12-26 DIAGNOSIS — Z0000 Encounter for general adult medical examination without abnormal findings: Secondary | ICD-10-CM

## 2014-12-26 DIAGNOSIS — R7309 Other abnormal glucose: Secondary | ICD-10-CM | POA: Diagnosis not present

## 2014-12-26 DIAGNOSIS — Z01419 Encounter for gynecological examination (general) (routine) without abnormal findings: Secondary | ICD-10-CM | POA: Diagnosis not present

## 2014-12-26 LAB — POCT URINALYSIS DIPSTICK
Bilirubin, UA: NEGATIVE
Blood, UA: NEGATIVE
Glucose, UA: NEGATIVE
Ketones, UA: NEGATIVE
Leukocytes, UA: NEGATIVE
Nitrite, UA: NEGATIVE
Protein, UA: NEGATIVE
Urobilinogen, UA: NEGATIVE
pH, UA: 5

## 2014-12-26 MED ORDER — LEVOFLOXACIN 500 MG PO TABS: 500.0000 mg | ORAL_TABLET | Freq: Two times a day (BID) | ORAL | Status: DC

## 2014-12-26 NOTE — Patient Instructions (Signed)

## 2014-12-26 NOTE — Telephone Encounter (Signed)
Pt called and was seen last week, pt still not feeling good, still pressure under the eye, coughing, and headache. Was wondering if you could send her something else in. PT uses [facility], pt can be contacted at [phone]

## 2014-12-26 NOTE — Telephone Encounter (Signed)
Patient choose levaquen 500 mg bid # 14 was called in

## 2014-12-26 NOTE — Progress Notes (Signed)
Patient ID: Mary Leach, female   DOB: 07/16/1950, 64 y.o.   MRN: [id number] 64 y.o. G2P2 Married Caucasian female here for annual exam.    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   Has celiac disease.  Had a ruptured colon.  Had colostomy and reversal.  Working with a nutritionist.  Sees Dr. Collene Mares for her GI care.   States elevated glucose in May 2016 - prediabetic range?  Doing Jonette Mate for exercise program.   PCP:  Jill Alexanders, MD   Patient's last menstrual period was 05/24/2006.          Sexually active: No.female  The current method of family planning is post menopausal status.    Exercising: Yes.    aerobics, weight training and yoga. Smoker:  no  Health Maintenance: Pap:  09-27-11 Neg History of abnormal Pap:  no MMG:  06-19-14 Density Cat.B/Neg:[facility] Colonoscopy:  09/2010 polyps with Dr. Collene Mares. Next due 09/2015. BMD:   06-19-14  Result Osteopenia:[facility].  Osteopenia of the hip and spine.  TDaP:  06-26-09 Screening Labs:  Hb today: 11.5, Urine today: Neg   reports that she has never smoked. She has never used smokeless tobacco. She reports that she drinks about 2.5 oz of alcohol per week. She reports that she does not use illicit drugs.  Past Medical History  Diagnosis Date  . Asthma   . ADD (attention deficit disorder)   . RBBB (right bundle branch block)   . Allergy     RHINITIS  . History of colonic polyps   . Asthma 10/18/2011  . ADD (attention deficit disorder with hyperactivity) 10/18/2011  . Ileus following gastrointestinal surgery 10/18/2011  . Malnutrition following gastrointestinal surgery 10/18/2011  . Celiac disease   . Depression   . Gluten free diet     allergic to gluten    Past Surgical History  Procedure Laterality Date  . Tonsillectomy and adenoidectomy    . Cystectomy      LEFT FOOT, RIGHT HAND  . Colonoscopy w/ polypectomy    . Laparotomy  10/07/2011    Procedure: EXPLORATORY LAPAROTOMY;  Surgeon: Odis Hollingshead, MD;  Location: [facility];  Service: General;  Laterality: N/A;  hartmann procedure and creation of colostomy  . Upper gastrointestinal endoscopy  01/19/12  . Colon surgery    . Colostomy takedown  05/29/2012    Procedure: LAPAROSCOPIC COLOSTOMY TAKEDOWN;  Surgeon: Odis Hollingshead, MD;  Location: [facility];  Service: General;  Laterality: N/A;  Laparoscopic Assisted Colostomy Closure  with proctoscopy  . Tubal ligation    . Foot surgery      Current Outpatient Prescriptions  Medication Sig Dispense Refill  . ADVAIR DISKUS 100-50 MCG/DOSE AEPB Inhale 1 puff into the lungs 2 (two) times daily.     Marland Kitchen amoxicillin-clavulanate (AUGMENTIN) 875-125 MG per tablet Take 1 tablet by mouth 2 (two) times daily. 20 tablet 0  . Azelastine-Fluticasone (DYMISTA) 137-50 MCG/ACT SUSP Place 1 spray into the nose daily.    . B Complex-Biotin-FA (B-COMPLEX PO) Take 100 mg by mouth daily.    Marland Kitchen Bioflavonoid Products (C COMPLEX PO) Take 500 mg by mouth daily.    . Biotin 10 MG CAPS Take 1 capsule by mouth daily.    Marland Kitchen Dextromethorphan Polistirex (DELSYM PO) Take 10 mLs by mouth 2 (two) times daily.    Marland Kitchen EPINEPHrine (EPI-PEN) 0.3 mg/0.3 mL DEVI Inject 0.3 mg into the muscle once. For previous allergies, maybe gluten    .  FLUoxetine (PROZAC) 10 MG tablet Take 30 mg by mouth 3 (three) times daily.     Marland Kitchen loratadine (CLARITIN) 10 MG tablet Take 10 mg by mouth daily.    . mometasone (ELOCON) 0.1 % cream Apply 1 application topically as needed.    . multivitamin-lutein (OCUVITE-LUTEIN) CAPS Take 1 capsule by mouth daily.    Marland Kitchen OVER THE COUNTER MEDICATION Take 1 capsule by mouth 2 (two) times daily. Algae omega 3 DHA(320)    . Probiotic Product (PROBIOTIC DAILY PO) Take by mouth. metaflora IB    . Vitamin D, Ergocalciferol, (DRISDOL) 50000 UNITS CAPS capsule Take 1 capsule (50,000 Units total) by mouth every 14 (fourteen) days. On mondays 26 capsule 1  . Zinc 22.5 MG TABS Take 22.5 mg by mouth daily.     No current  facility-administered medications for this visit.    Family History  Problem Relation Age of Onset  . Heart disease Father   . Osteoporosis Father   . Mental illness Paternal Aunt   . Cancer Maternal Grandmother     SKIN  . Hypertension Maternal Grandfather   . Heart disease Paternal Grandfather   . Osteoporosis Mother     ROS:  Pertinent items are noted in HPI.  Otherwise, a comprehensive ROS was negative.  Exam:   BP 120/74 mmHg  Pulse 56  Resp 14  Ht 5' 4"  (1.626 m)  Wt 126 lb (57.153 kg)  BMI 21.62 kg/m2  LMP 05/24/2006    General appearance: alert, cooperative and appears stated age Head: Normocephalic, without obvious abnormality, atraumatic Neck: no adenopathy, supple, symmetrical, trachea midline and thyroid normal to inspection and palpation Lungs: clear to auscultation bilaterally Breasts: normal appearance, no masses or tenderness, Inspection negative, No nipple retraction or dimpling, No nipple discharge or bleeding, No axillary or supraclavicular adenopathy Heart: regular rate and rhythm Abdomen: vertical midline incision and left mid abdominal incision, 2 cm hernia in umbilicus. Soft, non-tender; no masses,  no organomegaly Extremities: extremities normal, atraumatic, no cyanosis or edema Skin: Skin color, texture, turgor normal. No rashes or lesions Lymph nodes: Cervical, supraclavicular, and axillary nodes normal. No abnormal inguinal nodes palpated Neurologic: Grossly normal  Pelvic: External genitalia:  no lesions              Urethra:  normal appearing urethra with no masses, tenderness or lesions              Bartholins and Skenes: normal                 Vagina: normal appearing vagina with normal color and discharge, no lesions              Cervix: no lesions              Pap taken: Yes.   Bimanual Exam:  Uterus:  normal size, contour, position, consistency, mobility, non-tender              Adnexa: normal adnexa and no mass, fullness, tenderness               Rectovaginal: Yes.  .  Confirms.              Anus:  normal sphincter tone, no lesions  Chaperone was present for exam.  Assessment:   Well woman visit with normal exam. Osteoepenia.  Elevated glucose.   Plan: Yearly mammogram recommended after age 46.  Recommended self breast exam.  Pap and HR HPV as above. Discussed Calcium, Vitamin D,  regular exercise program including cardiovascular and weight bearing exercise. Labs performed.  Yes.  .   See orders. Refills given on medications.  No..    Follow up annually and prn.   After visit summary provided.

## 2014-12-26 NOTE — Telephone Encounter (Signed)
The only thing I can think of is to give her a Sterapred dose pack. Call her up and if she wants that, call in the double strength Dosepak. If she doesn't one that then I would probably switch her to Levaquin 500 daily #14

## 2014-12-27 ENCOUNTER — Other Ambulatory Visit: Payer: Self-pay | Admitting: Obstetrics and Gynecology

## 2014-12-27 DIAGNOSIS — E559 Vitamin D deficiency, unspecified: Secondary | ICD-10-CM

## 2014-12-27 LAB — LIPID PANEL
Cholesterol: 154 mg/dL (ref 125–200)
HDL: 46 mg/dL (ref >=46)
LDL Cholesterol: 96 mg/dL (ref <130)
Total CHOL/HDL Ratio: 3.3 Ratio (ref <=5.0)
Triglycerides: 62 mg/dL (ref <150)
VLDL: 12 mg/dL (ref <30)

## 2014-12-27 LAB — COMPREHENSIVE METABOLIC PANEL
ALT: 14 U/L (ref 6–29)
AST: 20 U/L (ref 10–35)
Albumin: 4 g/dL (ref 3.6–5.1)
Alkaline Phosphatase: 62 U/L (ref 33–130)
BUN: 16 mg/dL (ref 7–25)
CO2: 24 mmol/L (ref 20–31)
Calcium: 9 mg/dL (ref 8.6–10.4)
Chloride: 94 mmol/L — ABNORMAL LOW (ref 98–110)
Creat: 0.57 mg/dL (ref 0.50–0.99)
Glucose, Bld: 101 mg/dL — ABNORMAL HIGH (ref 65–99)
Potassium: 5 mmol/L (ref 3.5–5.3)
Sodium: 128 mmol/L — ABNORMAL LOW (ref 135–146)
Total Bilirubin: 0.4 mg/dL (ref 0.2–1.2)
Total Protein: 6.6 g/dL (ref 6.1–8.1)

## 2014-12-27 LAB — CBC
HCT: 33.8 % — ABNORMAL LOW (ref 36.0–46.0)
Hemoglobin: 11.5 g/dL — ABNORMAL LOW (ref 12.0–15.0)
MCH: 32.3 pg (ref 26.0–34.0)
MCHC: 34 g/dL (ref 30.0–36.0)
MCV: 94.9 fL (ref 78.0–100.0)
MPV: 9.4 fL (ref 8.6–12.4)
Platelets: 255 10*3/uL (ref 150–400)
RBC: 3.56 MIL/uL — ABNORMAL LOW (ref 3.87–5.11)
RDW: 13.2 % (ref 11.5–15.5)
WBC: 2.9 10*3/uL — ABNORMAL LOW (ref 4.0–10.5)

## 2014-12-27 LAB — TSH: TSH: 3.092 u[IU]/mL (ref 0.350–4.500)

## 2014-12-27 LAB — HEMOGLOBIN A1C
Hgb A1c MFr Bld: 5.7 % — ABNORMAL HIGH (ref <5.7)
Mean Plasma Glucose: 117 mg/dL — ABNORMAL HIGH (ref <117)

## 2014-12-27 LAB — HEMOGLOBIN, FINGERSTICK: Hemoglobin, fingerstick: 11.5 g/dL — ABNORMAL LOW (ref 12.0–16.0)

## 2014-12-27 LAB — VITAMIN D 25 HYDROXY (VIT D DEFICIENCY, FRACTURES): Vit D, 25-Hydroxy: 50 ng/mL (ref 30–100)

## 2014-12-27 MED ORDER — VITAMIN D (ERGOCALCIFEROL) 1.25 MG (50000 UNIT) PO CAPS: 50000.0000 [IU] | ORAL_CAPSULE | ORAL | Status: DC

## 2014-12-31 LAB — IPS PAP TEST WITH HPV

## 2015-01-21 ENCOUNTER — Telehealth: Payer: Self-pay | Admitting: Obstetrics and Gynecology

## 2015-01-21 NOTE — Telephone Encounter (Signed)
Patient calling requesting her LMP and the approximate date she started menopause.

## 2015-01-21 NOTE — Telephone Encounter (Signed)
Called patient. She is completing forms for a physical. She is given date of LMP per record 05/24/2006. Advised considered menopausal after one year of no menses. Patient agreeable. She will call back with any further questions. Routing to provider for final review. Patient agreeable to disposition. Will close encounter.

## 2015-03-31 IMAGING — CT CT MAXILLOFACIAL W/O CM
3 series · 17 of 30 positions shown, 19 images · non-contrast
Comparison: 06/01/2010

CLINICAL DATA: Chronic maxillary sinusitis. Maxillary and frontal
pain.

EXAM:
CT MAXILLOFACIAL WITHOUT CONTRAST
TECHNIQUE: Multidetector CT imaging of the maxillofacial structures was
performed. Multiplanar CT image reconstructions were also generated.
A small metallic BB was placed on the right temple in order to
reliably differentiate right from left.

[Series 3: axial soft 1.25 · axial · 0.49mm/px · z∈[-6,+6]mm · 2 of 101 slices shown]
[im 11/101  brain]
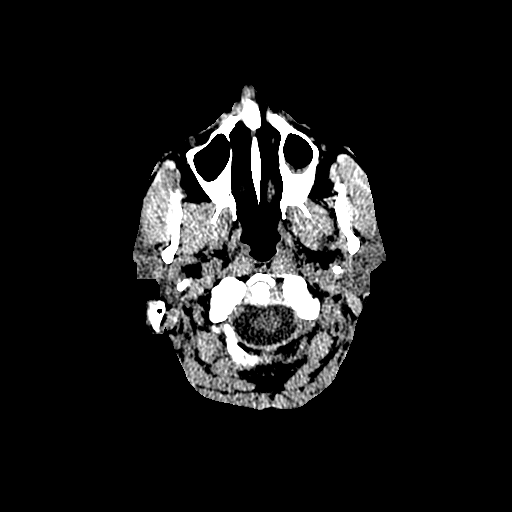
[im 21/101  brain]
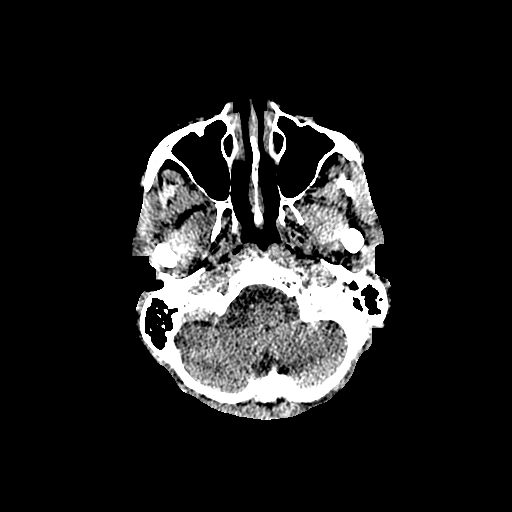

[Series 200: cor · coronal · 0.49mm/px · 8 of 99 slices shown, 10 images]
[im 11/99  brain]
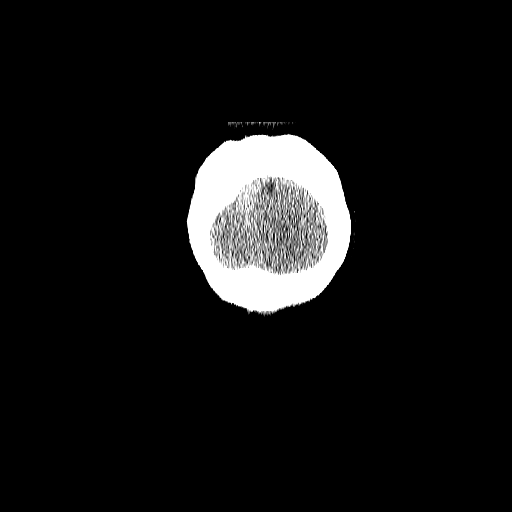
[im 11/99  bone]
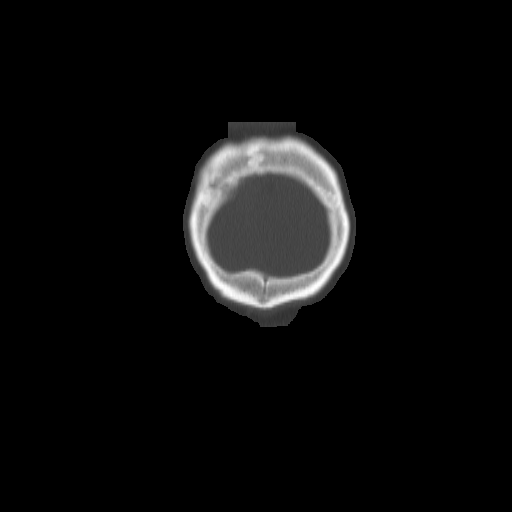
[im 22/99  bone]
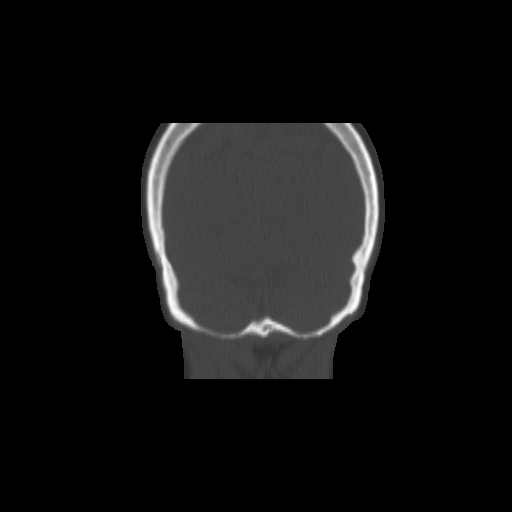
[im 33/99  bone]
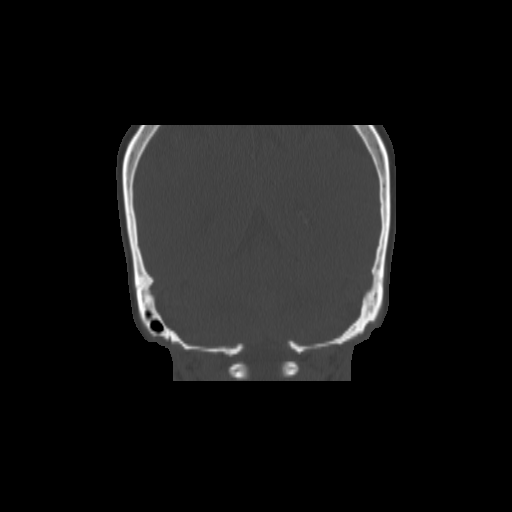
[im 44/99  bone]
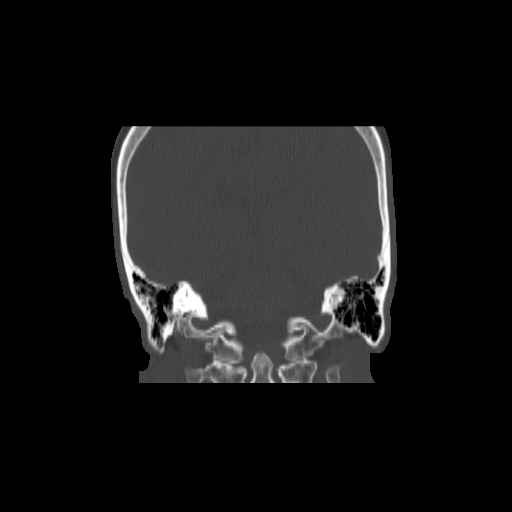
[im 55/99  brain]
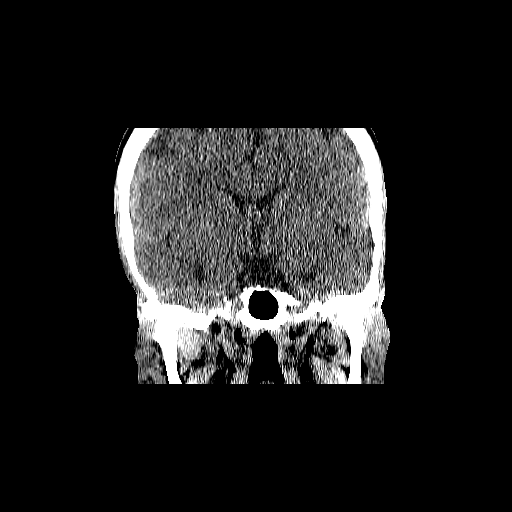
[im 55/99  bone]
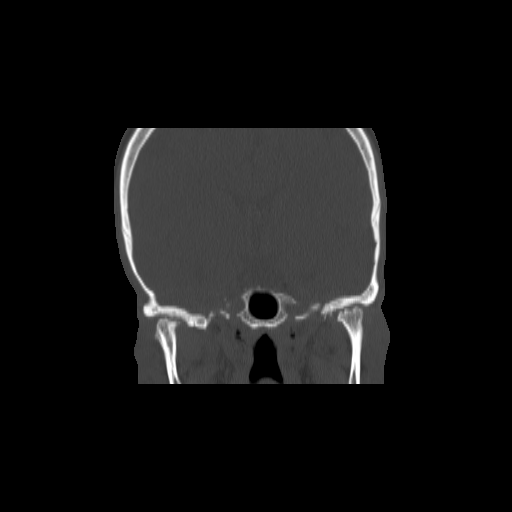
[im 66/99  bone]
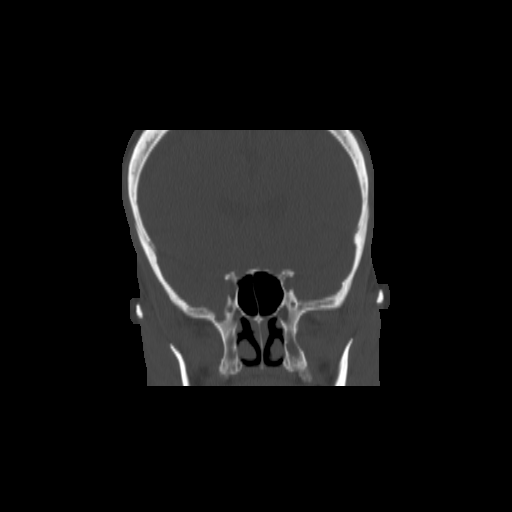
[im 77/99  bone]
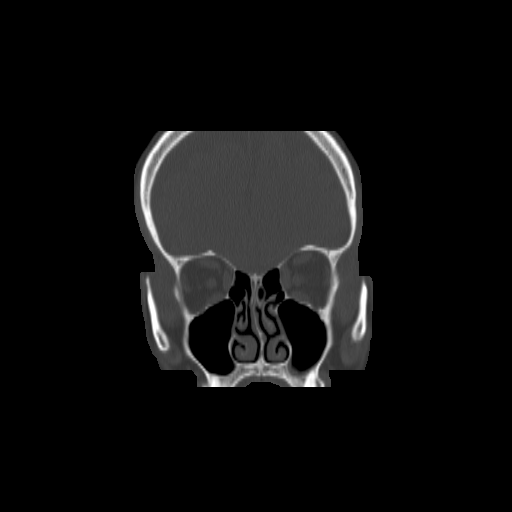
[im 88/99  bone]
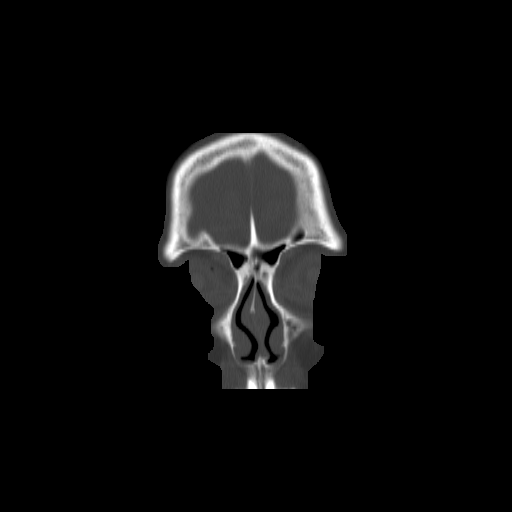

[Series 201: sag · sagittal · 0.49mm/px · 7 of 84 slices shown]
[im 11/84  bone]
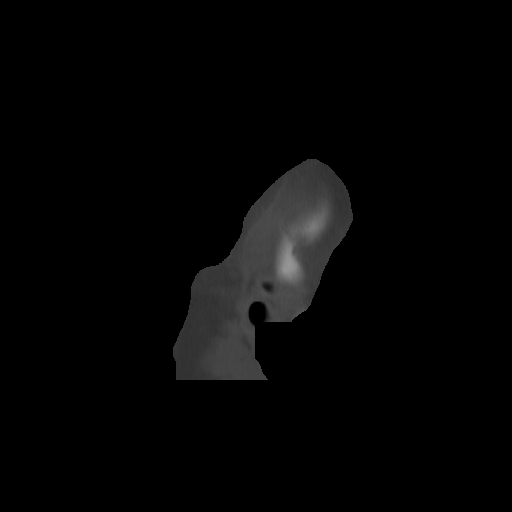
[im 21/84  bone]
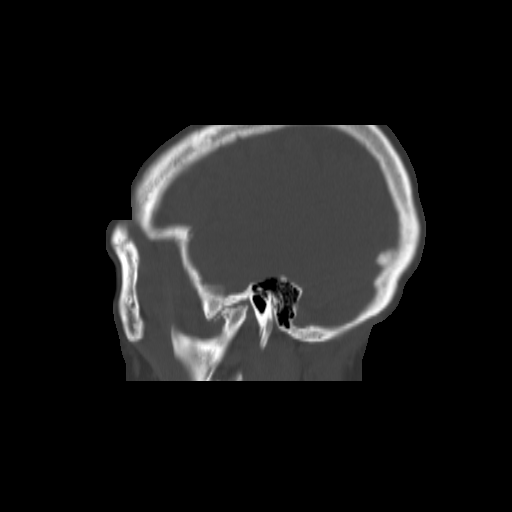
[im 32/84  bone]
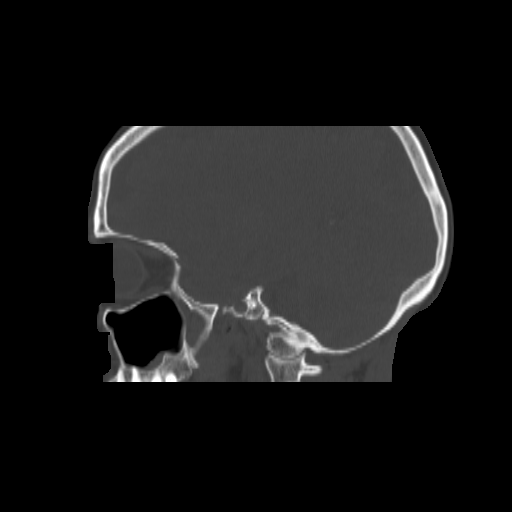
[im 42/84  bone]
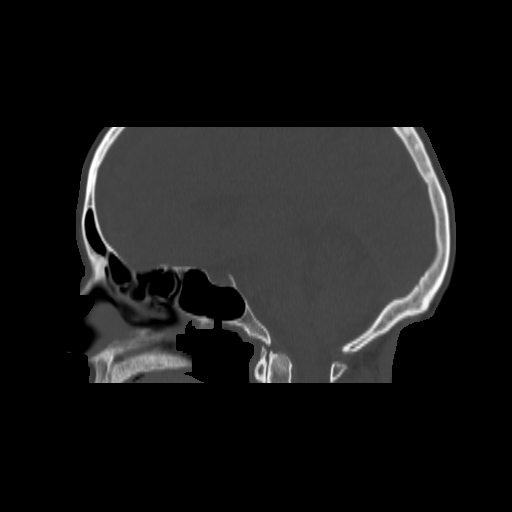
[im 52/84  bone]
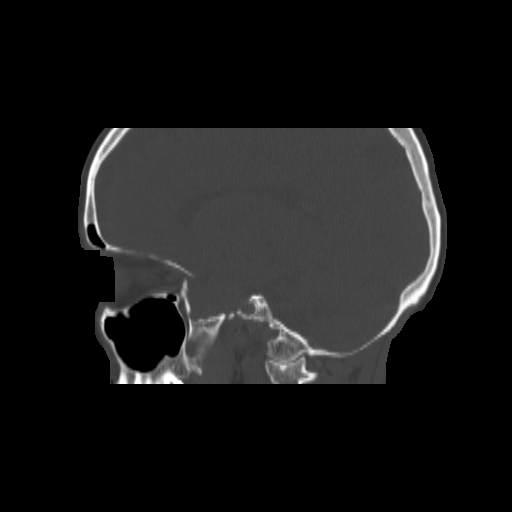
[im 63/84  bone]
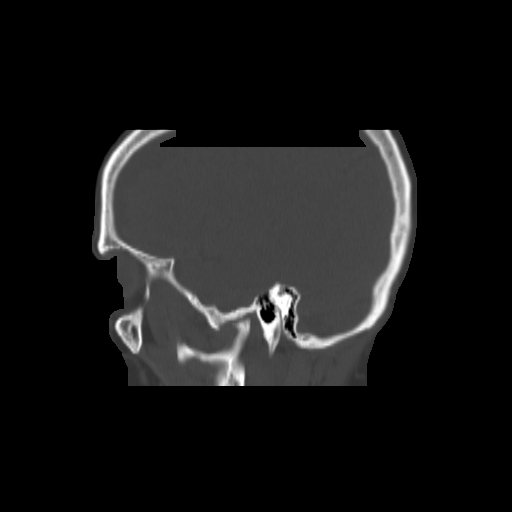
[im 73/84  bone]
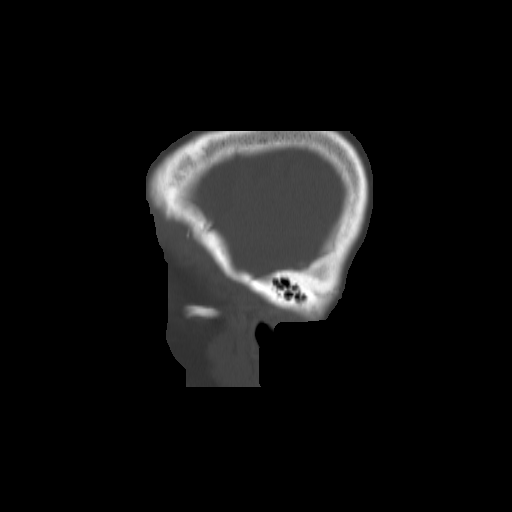

[17 of 30 positions shown; findings below may reference images not displayed]

FINDINGS: There is mild mucosal thickening in the alveolar recess of the left
maxillary sinus and minimal mucosal thickening in the alveolar
recess of the right maxillary sinus, not significantly changed.
Other paranasal sinuses are clear. No air-fluid levels are seen.
Frontal recesses, ostiomeatal complexes, and sphenoethmoidal
recesses are patent. Rightward nasal septal deviation is unchanged.
Mastoid air cells are clear. Visualized portion of the brain is
grossly unremarkable. Orbits are unremarkable.
IMPRESSION: Small amount of mucosal thickening inferiorly in the maxillary
sinuses, not significantly changed.

## 2015-07-02 ENCOUNTER — Ambulatory Visit (INDEPENDENT_AMBULATORY_CARE_PROVIDER_SITE_OTHER): Payer: Commercial Managed Care - HMO | Admitting: Family Medicine

## 2015-07-02 ENCOUNTER — Encounter: Payer: Self-pay | Admitting: Family Medicine

## 2015-07-02 VITALS — BP 110/74 | HR 63 | Ht 64.0 in | Wt 123.0 lb

## 2015-07-02 DIAGNOSIS — J0100 Acute maxillary sinusitis, unspecified: Secondary | ICD-10-CM

## 2015-07-02 DIAGNOSIS — R7302 Impaired glucose tolerance (oral): Secondary | ICD-10-CM

## 2015-07-02 DIAGNOSIS — J301 Allergic rhinitis due to pollen: Secondary | ICD-10-CM

## 2015-07-02 DIAGNOSIS — K900 Celiac disease: Secondary | ICD-10-CM

## 2015-07-02 DIAGNOSIS — F909 Attention-deficit hyperactivity disorder, unspecified type: Secondary | ICD-10-CM

## 2015-07-02 DIAGNOSIS — J4520 Mild intermittent asthma, uncomplicated: Secondary | ICD-10-CM

## 2015-07-02 MED ORDER — AMOXICILLIN-POT CLAVULANATE 875-125 MG PO TABS: 1.0000 | ORAL_TABLET | Freq: Two times a day (BID) | ORAL | Status: DC

## 2015-07-02 NOTE — Progress Notes (Signed)
   Subjective:    Patient ID: Mary Leach, female    DOB: 01-14-1951, 65 y.o.   MRN: [id number]  HPI She is here for evaluation of a ten-day history that started with fatigue, malaise followed by PND, rhinorrhea, nasal congestion and feeling hot as well as frontal headache. She also states she has had a productive cough during his whole timeframe. She was exposed to young child did have cold symptoms. She has been using a Netti pot. She does have underlying allergies and asthma and has been on immunotherapy but states it has not been helpful. She continues on medications listed in the chart. She also has a history of celiac disease and presently is being cared for by her on [facility] in [place]. She has also seen Dr. Collene Mares in the past for this. He does have a history of ADD but presently is not on medication. Recent blood work through the alternative medicine group shows a hemoglobin A1c of 5.9.   Review of Systems     Objective:   Physical Exam Alert and in no distress. Tympanic membranes and canals are normal. Pharyngeal area is normal. Neck is supple without adenopathy or thyromegaly. Mucosa is normal. Tender over frontal and maxillary sinuses.Cardiac exam shows a regular sinus rhythm without murmurs or gallops. Lungs are clear to auscultation.        Assessment & Plan:  Non-seasonal allergic rhinitis due to pollen  Asthma, mild intermittent, uncomplicated  Acute maxillary sinusitis, recurrence not specified - Plan: amoxicillin-clavulanate (AUGMENTIN) 875-125 MG tablet  Glucose intolerance (impaired glucose tolerance)  Celiac sprue  Attention deficit hyperactivity disorder (ADHD), unspecified ADHD type I will treat her sinus disease with Augmentin. She will continue on her other allergy and asthma medications.I also talked to her in detail about the hemoglobin A1c of 5.9 and the ramifications of that. She does seem to have a fairly good understanding of this. She  will continue to monitor this and trying control it with diet and exercise. Discussed follow-up with her concerning this and will let that be upped her discretion.

## 2015-07-02 NOTE — Patient Instructions (Signed)
Take all the antibiotic and if not totally back to normal call me

## 2015-07-09 ENCOUNTER — Encounter: Payer: Self-pay | Admitting: Family Medicine

## 2015-07-25 ENCOUNTER — Other Ambulatory Visit: Payer: Self-pay

## 2015-07-25 DIAGNOSIS — Z1231 Encounter for screening mammogram for malignant neoplasm of breast: Secondary | ICD-10-CM

## 2015-07-28 ENCOUNTER — Ambulatory Visit (INDEPENDENT_AMBULATORY_CARE_PROVIDER_SITE_OTHER): Payer: Commercial Managed Care - HMO | Admitting: Family Medicine

## 2015-07-28 ENCOUNTER — Encounter: Payer: Self-pay | Admitting: Family Medicine

## 2015-07-28 VITALS — BP 108/68 | HR 60 | Temp 97.4°F | Wt 124.0 lb

## 2015-07-28 DIAGNOSIS — J4520 Mild intermittent asthma, uncomplicated: Secondary | ICD-10-CM

## 2015-07-28 DIAGNOSIS — J0100 Acute maxillary sinusitis, unspecified: Secondary | ICD-10-CM | POA: Diagnosis not present

## 2015-07-28 DIAGNOSIS — J301 Allergic rhinitis due to pollen: Secondary | ICD-10-CM | POA: Diagnosis not present

## 2015-07-28 MED ORDER — LEVOFLOXACIN 500 MG PO TABS: 500.0000 mg | ORAL_TABLET | Freq: Every day | ORAL | Status: DC

## 2015-07-28 NOTE — Progress Notes (Signed)
   Subjective:    Patient ID: Mary Leach, female    DOB: 11-Dec-1950, 65 y.o.   MRN: [id number]  HPI She was treatedone month ago for a sinus infection and states that she was back to normal. Ten days ago she started to have trouble with sinus pressure, rhinorrhea, PND but no sore throat. She has an underlying history of allergies and asthma.   Review of Systems     Objective:   Physical Exam Alert and in no distress. Tympanic membranes and canals are normal. Pharyngeal area is normal. Neck is supple without adenopathy or thyromegaly. Cardiac exam shows a regular sinus rhythm without murmurs or gallops. Lungs are clear to auscultation.his mucosa is slightly red with tenderness over maxillary sinuses       Assessment & Plan:  Acute maxillary sinusitis, recurrence not specified - Plan: levofloxacin (LEVAQUIN) 500 MG tablet  Non-seasonal allergic rhinitis due to pollen  Asthma, mild intermittent, uncomplicated  I will try Levaquin as the Amoxil because difficulty with GI symptoms.

## 2015-08-04 ENCOUNTER — Encounter: Payer: Self-pay | Admitting: Family Medicine

## 2015-08-04 ENCOUNTER — Telehealth: Payer: Self-pay | Admitting: Family Medicine

## 2015-08-04 ENCOUNTER — Ambulatory Visit (INDEPENDENT_AMBULATORY_CARE_PROVIDER_SITE_OTHER): Payer: Commercial Managed Care - HMO | Admitting: Family Medicine

## 2015-08-04 VITALS — BP 90/70 | HR 61 | Ht 64.0 in | Wt 119.0 lb

## 2015-08-04 DIAGNOSIS — J0100 Acute maxillary sinusitis, unspecified: Secondary | ICD-10-CM

## 2015-08-04 DIAGNOSIS — J301 Allergic rhinitis due to pollen: Secondary | ICD-10-CM | POA: Diagnosis not present

## 2015-08-04 MED ORDER — PREDNISONE 10 MG (48) PO TBPK: ORAL_TABLET | Freq: Every day | ORAL | Status: DC

## 2015-08-04 MED ORDER — AZELASTINE-FLUTICASONE 137-50 MCG/ACT NA SUSP: 2.0000 | Freq: Two times a day (BID) | NASAL | Status: DC

## 2015-08-04 MED ORDER — LEVOFLOXACIN 500 MG PO TABS: 500.0000 mg | ORAL_TABLET | Freq: Every day | ORAL | Status: DC

## 2015-08-04 NOTE — Telephone Encounter (Signed)
Pt called and is concerned about taking the predniSONE (STERAPRED UNI-PAK 48 TAB) 10 MG (48) TBPK tablet, says that is a lot of pills to take. Says it is overkills, wants to talk to you about this, says she dont think her body can handle taking that many pills, says she wants to talk to you before she starts taking them, pt can be reached at [phone] (M)

## 2015-08-04 NOTE — Progress Notes (Signed)
   Subjective:    Patient ID: Mary Leach, female    DOB: 1950-09-08, 65 y.o.   MRN: [id number]  HPI She is here for a recheck. She states that she is only 50% better but definitely a decrease in her energy. She also complains of PND, nasal congestion, hoarse voice no fever, chills, shortness of breath. Would also like a refill on her Dymista  Review of Systems     Objective:   Physical Exam Alert and in no distress. Tympanic membranes and canals are normal. Pharyngeal area is normal. Neck is supple without adenopathy or thyromegaly.nasal mucosa is normal with some tenderness over frontal sinuses      Assessment & Plan:  Acute maxillary sinusitis, recurrence not specified - Plan: levofloxacin (LEVAQUIN) 500 MG tablet, predniSONE (STERAPRED UNI-PAK 48 TAB) 10 MG (48) TBPK tablet  Non-seasonal allergic rhinitis due to pollen - Plan: Azelastine-Fluticasone (DYMISTA) 137-50 MCG/ACT SUSP since she has other intolerances and allergies, I will give her more Levaquin but also steroids. She is to call me when she finishes if not entirely back to normal

## 2015-08-06 ENCOUNTER — Ambulatory Visit
Admission: RE | Admit: 2015-08-06 | Discharge: 2015-08-06 | Disposition: A | Discharge status: Home / Self Care | Payer: Commercial Managed Care - HMO | Source: Ambulatory Visit

## 2015-08-06 ENCOUNTER — Other Ambulatory Visit: Payer: Self-pay

## 2015-08-06 DIAGNOSIS — Z1231 Encounter for screening mammogram for malignant neoplasm of breast: Secondary | ICD-10-CM

## 2015-08-26 ENCOUNTER — Ambulatory Visit (INDEPENDENT_AMBULATORY_CARE_PROVIDER_SITE_OTHER): Payer: Commercial Managed Care - HMO | Admitting: Family Medicine

## 2015-08-26 ENCOUNTER — Encounter: Payer: Self-pay | Admitting: Family Medicine

## 2015-08-26 ENCOUNTER — Ambulatory Visit
Admission: RE | Admit: 2015-08-26 | Discharge: 2015-08-26 | Disposition: A | Discharge status: Home / Self Care | Payer: Commercial Managed Care - HMO | Source: Ambulatory Visit | Attending: Family Medicine | Admitting: Family Medicine

## 2015-08-26 ENCOUNTER — Telehealth: Payer: Self-pay | Admitting: Internal Medicine

## 2015-08-26 VITALS — BP 110/58 | HR 60 | Temp 97.7°F | Wt 120.8 lb

## 2015-08-26 DIAGNOSIS — J0101 Acute recurrent maxillary sinusitis: Secondary | ICD-10-CM

## 2015-08-26 DIAGNOSIS — J301 Allergic rhinitis due to pollen: Secondary | ICD-10-CM

## 2015-08-26 DIAGNOSIS — J4520 Mild intermittent asthma, uncomplicated: Secondary | ICD-10-CM

## 2015-08-26 NOTE — Progress Notes (Signed)
Subjective:     Patient ID: Mary Leach, female   DOB: 05-16-1951, 65 y.o.   MRN: [id number]  HPI Mary Leach has a history of seasonal allergies,recurrent sinus infections, and well-controlled asthma and presents to clinic today with a 2 month history of facial pain, fatigue, and rhinorrhea that has progressed over the past few weeks to a productive cough with green sputum and vocal changes. She was previously treated for this sinus infection a few weeks ago with levaquin and prednisone, but symptoms did not completely resolve. She denies fever, sore throat, dyspnea, and upper tooth pain.  She has seen an allergist who has referred her to a Psychologist, sport and exercise. She was initially unsure about going through with the surgery due to expense.  She was told that By her allergist that he would not continue to treat the sinus infections since they seem to be recurrent and essentially unresponsive to antibiotics. She uses zyrtec, dymista, and mucinex, and a netti pot daily which does mildly improve her allergy symptoms.     Review of Systems     Objective:   Physical Exam Alert and in no distress. Tympanic membranes and canals are normal. Pharyngeal area is normal. Maxiallary sinus tenderness.   Neck is supple without adenopathy or thyromegaly. Cardiac exam shows a regular sinus rhythm without murmurs or gallops. Lungs are clear to auscultation.     Assessment:     Acute recurrent maxillary sinusitis - Plan: CT Maxillofacial WO CM, CANCELED: CT MAXILLOFACIAL LTD WO CM      Plan:     Acute recurrent maxillary sinusitis She is going to see the ENT to see about surgical options that the surgeon previously discussed with the patient. Decided against antibiotic treatment today but will get a CT of sinuses in preparation for surgery.

## 2015-08-26 NOTE — Telephone Encounter (Signed)
Pt was approved for CT sinus full- approval # [id number] good for 45 days

## 2015-09-26 ENCOUNTER — Other Ambulatory Visit: Payer: Self-pay | Admitting: Otolaryngology

## 2015-10-13 ENCOUNTER — Encounter: Payer: Self-pay | Admitting: Obstetrics and Gynecology

## 2015-10-13 ENCOUNTER — Ambulatory Visit (INDEPENDENT_AMBULATORY_CARE_PROVIDER_SITE_OTHER): Payer: Commercial Managed Care - HMO | Admitting: Obstetrics and Gynecology

## 2015-10-13 VITALS — BP 100/60 | HR 60 | Resp 16 | Ht 64.75 in | Wt 122.0 lb

## 2015-10-13 DIAGNOSIS — N760 Acute vaginitis: Secondary | ICD-10-CM

## 2015-10-13 MED ORDER — NYSTATIN-TRIAMCINOLONE 100000-0.1 UNIT/GM-% EX CREA: 1.0000 "application " | TOPICAL_CREAM | Freq: Two times a day (BID) | CUTANEOUS | Status: DC

## 2015-10-13 NOTE — Progress Notes (Signed)
GYNECOLOGY  VISIT   HPI: 65 y.o.   Married  Caucasian  female   G2P2 with Patient's last menstrual period was 05/24/2006.   here for Vaginal irritation/slight burning since Thursday 10/09/15. Little bit of odor and discharge.  Going to the gym and staying in her work out clothes.  Used corn starch powder and symptoms are still there.   No recent sexual activity.   Recently has had hemoglobin A1C elevated.  Was 5.9 and is now 5.2 Did this through dietary change. Wishes her family would do the same healthy lifestyle.  Having sinus surgery for a deviated septum.  Had back to back sinusitis this spring.  Took a course of prednisone also.  GYNECOLOGIC HISTORY: Patient's last menstrual period was 05/24/2006. Contraception:  Post Menopausal Menopausal hormone therapy:  none Last mammogram:  08/08/15 BIRADS 1 negative Last pap smear:   12/26/14 Pap and HR HPV negative        OB History    Gravida Para Term Preterm AB TAB SAB Ectopic Multiple Living   2 2        2          Patient Active Problem List   Diagnosis Date Noted  . Celiac sprue 07/02/2015  . Allergic rhinitis due to pollen 12/18/2014  . H/O resection of large bowel 12/15/2013  . 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 Asthma 10/18/2011  . ADHD (attention deficit hyperactivity disorder) 10/18/2011  . Rectal Perforation due to fecal impaction s/p Hartmann procedure 5/16 10/08/2011    Past Medical History  Diagnosis Date  . Asthma   . ADD (attention deficit disorder)   . RBBB (right bundle branch block)   . Allergy     RHINITIS  . History of colonic polyps   . Asthma 10/18/2011  . ADD (attention deficit disorder with hyperactivity) 10/18/2011  . Ileus following gastrointestinal surgery 10/18/2011  . Malnutrition following gastrointestinal surgery 10/18/2011  . Celiac disease   . Depression   . Gluten free diet     allergic to gluten    Past Surgical History  Procedure Laterality Date   . Tonsillectomy and adenoidectomy    . Cystectomy      LEFT FOOT, RIGHT HAND  . Colonoscopy w/ polypectomy    . Laparotomy  10/07/2011    Procedure: EXPLORATORY LAPAROTOMY;  Surgeon: Odis Hollingshead, MD;  Location: [facility];  Service: General;  Laterality: N/A;  hartmann procedure and creation of colostomy  . Upper gastrointestinal endoscopy  01/19/12  . Colon surgery    . Colostomy takedown  05/29/2012    Procedure: LAPAROSCOPIC COLOSTOMY TAKEDOWN;  Surgeon: Odis Hollingshead, MD;  Location: [facility];  Service: General;  Laterality: N/A;  Laparoscopic Assisted Colostomy Closure  with proctoscopy  . Tubal ligation    . Foot surgery      Current Outpatient Prescriptions  Medication Sig Dispense Refill  . ADVAIR DISKUS 100-50 MCG/DOSE AEPB Inhale 1 puff into the lungs 2 (two) times daily.     . Azelastine-Fluticasone (DYMISTA) 137-50 MCG/ACT SUSP Place 2 sprays into the nose 2 (two) times daily. 1 Bottle 11  . B Complex-Biotin-FA (B-COMPLEX PO) Take 100 mg by mouth daily.    Marland Kitchen Bioflavonoid Products (C COMPLEX PO) Take 500 mg by mouth daily.     . Biotin 10 MG CAPS Take 1 capsule by mouth daily.    . Cholecalciferol (VITAMIN D) 2000 units tablet Take 2,000 Units by mouth daily.    Marland Kitchen  EPINEPHrine (EPI-PEN) 0.3 mg/0.3 mL DEVI Inject 0.3 mg into the muscle once. For previous allergies, maybe gluten    . FLUoxetine (PROZAC) 10 MG tablet Take 20 mg by mouth daily.     . multivitamin-lutein (OCUVITE-LUTEIN) CAPS Take 1 capsule by mouth daily.    Marland Kitchen OVER THE COUNTER MEDICATION Take 1 capsule by mouth 2 (two) times daily. Algae omega 3 DHA(320)    . Probiotic Product (PROBIOTIC DAILY PO) Take by mouth. metaflora IB    . selenium 50 MCG TABS tablet Take 50 mcg by mouth daily.    . Thyroid (NATURE-THROID PO) Take by mouth daily. 1 grain    . UNABLE TO FIND daily. Med Name: I-Throid 6.49m 1 daily m-w-f    . VITAMIN K PO Take 150 mcg by mouth daily.    . Zinc 22.5 MG TABS Take 22.5 mg by mouth daily.      No current facility-administered medications for this visit.     ALLERGIES: Augmentin and Sulfonamide derivatives  Family History  Problem Relation Age of Onset  . Heart disease Father   . Osteoporosis Father   . Mental illness Paternal Aunt   . Cancer Maternal Grandmother     SKIN  . Hypertension Maternal Grandfather   . Heart disease Paternal Grandfather   . Osteoporosis Mother     Social History   Social History  . Marital Status: Married    Spouse Name: N/A  . Number of Children: N/A  . Years of Education: N/A   Occupational History  . Not on file.   Social History Main Topics  . Smoking status: Never Smoker   . Smokeless tobacco: Never Used  . Alcohol Use: 2.5 oz/week    5 Standard drinks or equivalent per week     Comment: 1-3 glasses of wine a week or gluten free beer  . Drug Use: No  . Sexual Activity:    Partners: Male    Birth Control/ Protection: Post-menopausal   Other Topics Concern  . Not on file   Social History Narrative   Married, lives with husband and son JJenny Reichmann  [employer] ([employer]).  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  PHYSICAL EXAMINATION:    BP 100/60 mmHg  Pulse 60  Resp 16  Ht 5' 4.75" (1.645 m)  Wt 122 lb (55.339 kg)  BMI 20.45 kg/m2  LMP 05/24/2006    General appearance: alert, cooperative and appears stated age    Pelvic: External genitalia:  Slit like area of the left labia minora.                Urethra:  normal appearing urethra with no masses, tenderness or lesions              Bartholins and Skenes: normal                 Vagina: normal appearing vagina with normal color and discharge, no lesions              Cervix: no lesions           Bimanual Exam:  Uterus:  normal size, contour, position, consistency, mobility, non-tender              Adnexa: normal adnexa and no mass, fullness, tenderness           Chaperone was present for  exam.  ASSESSMENT  Vulvovaginitis. Likely Candida. HgbA1C decreased.  PLAN  Discussion of vaginitis.  Will tx with Mycolog II.  Rx to pharmacy.  Affirm done.  Avoid staying in moist clothing - gym clothes, bathing suit.  Great job with lowering glucose levels. Follow up prn.    An After Visit Summary was printed and given to the patient.  _15____ minutes face to face time of which over 50% was spent in counseling.

## 2015-10-14 LAB — WET PREP BY MOLECULAR PROBE
Candida species: NEGATIVE
Gardnerella vaginalis: NEGATIVE
Trichomonas vaginosis: NEGATIVE

## 2015-11-03 ENCOUNTER — Encounter (HOSPITAL_BASED_OUTPATIENT_CLINIC_OR_DEPARTMENT_OTHER): Payer: Self-pay | Admitting: *Deleted

## 2015-11-11 ENCOUNTER — Ambulatory Visit (HOSPITAL_BASED_OUTPATIENT_CLINIC_OR_DEPARTMENT_OTHER): Payer: Commercial Managed Care - HMO | Admitting: Anesthesiology

## 2015-11-11 ENCOUNTER — Ambulatory Visit (HOSPITAL_BASED_OUTPATIENT_CLINIC_OR_DEPARTMENT_OTHER)
Admission: RE | Admit: 2015-11-11 | Discharge: 2015-11-11 | Disposition: A | Discharge status: Home / Self Care | Payer: Commercial Managed Care - HMO | Source: Ambulatory Visit | Attending: Otolaryngology | Admitting: Otolaryngology

## 2015-11-11 ENCOUNTER — Encounter (HOSPITAL_BASED_OUTPATIENT_CLINIC_OR_DEPARTMENT_OTHER)
Admission: RE | Disposition: A | Discharge status: Home / Self Care | Payer: Self-pay | Source: Ambulatory Visit | Attending: Otolaryngology

## 2015-11-11 ENCOUNTER — Encounter (HOSPITAL_BASED_OUTPATIENT_CLINIC_OR_DEPARTMENT_OTHER): Payer: Self-pay | Admitting: Anesthesiology

## 2015-11-11 DIAGNOSIS — J342 Deviated nasal septum: Secondary | ICD-10-CM | POA: Insufficient documentation

## 2015-11-11 DIAGNOSIS — J3489 Other specified disorders of nose and nasal sinuses: Secondary | ICD-10-CM | POA: Diagnosis not present

## 2015-11-11 DIAGNOSIS — J320 Chronic maxillary sinusitis: Secondary | ICD-10-CM | POA: Insufficient documentation

## 2015-11-11 DIAGNOSIS — J343 Hypertrophy of nasal turbinates: Secondary | ICD-10-CM | POA: Insufficient documentation

## 2015-11-11 HISTORY — DX: Bipolar disorder, unspecified: F31.9

## 2015-11-11 HISTORY — PX: NASAL SINUS SURGERY: SHX719

## 2015-11-11 HISTORY — PX: NASAL SEPTOPLASTY W/ TURBINOPLASTY: SHX2070

## 2015-11-11 HISTORY — DX: Anxiety disorder, unspecified: F41.9

## 2015-11-11 SURGERY — SEPTOPLASTY, NOSE, WITH NASAL TURBINATE REDUCTION
Anesthesia: General | Site: Nose | Laterality: Bilateral

## 2015-11-11 MED ORDER — FENTANYL CITRATE (PF) 100 MCG/2ML IJ SOLN
INTRAMUSCULAR | Status: AC
  Filled 2015-11-11: qty 2

## 2015-11-11 MED ORDER — GLYCOPYRROLATE 0.2 MG/ML IJ SOLN: 0.2000 mg | Freq: Once | INTRAMUSCULAR | Status: DC | PRN

## 2015-11-11 MED ORDER — LIDOCAINE HCL (CARDIAC) 20 MG/ML IV SOLN
INTRAVENOUS | Status: DC | PRN
  Administered 2015-11-11: 100 mg via INTRAVENOUS

## 2015-11-11 MED ORDER — MIDAZOLAM HCL 2 MG/2ML IJ SOLN
1.0000 mg | INTRAMUSCULAR | Status: DC | PRN
  Administered 2015-11-11: 2 mg via INTRAVENOUS

## 2015-11-11 MED ORDER — PROPOFOL 10 MG/ML IV BOLUS
INTRAVENOUS | Status: DC | PRN
  Administered 2015-11-11: 200 mg via INTRAVENOUS

## 2015-11-11 MED ORDER — HYDROMORPHONE HCL 1 MG/ML IJ SOLN
INTRAMUSCULAR | Status: AC
  Filled 2015-11-11: qty 1

## 2015-11-11 MED ORDER — MEPERIDINE HCL 25 MG/ML IJ SOLN: 6.2500 mg | INTRAMUSCULAR | Status: DC | PRN

## 2015-11-11 MED ORDER — ONDANSETRON HCL 4 MG/2ML IJ SOLN
INTRAMUSCULAR | Status: AC
  Filled 2015-11-11: qty 2

## 2015-11-11 MED ORDER — OXYCODONE HCL 5 MG/5ML PO SOLN: 5.0000 mg | Freq: Once | ORAL | Status: AC | PRN

## 2015-11-11 MED ORDER — OXYMETAZOLINE HCL 0.05 % NA SOLN
NASAL | Status: DC | PRN
Start: 2015-11-11 — End: 2015-11-11
  Administered 2015-11-11: 1 via TOPICAL

## 2015-11-11 MED ORDER — LIDOCAINE-EPINEPHRINE 1 %-1:100000 IJ SOLN
INTRAMUSCULAR | Status: DC | PRN
Start: 2015-11-11 — End: 2015-11-11
  Administered 2015-11-11: 4 mL

## 2015-11-11 MED ORDER — ONDANSETRON HCL 4 MG/2ML IJ SOLN
INTRAMUSCULAR | Status: DC | PRN
  Administered 2015-11-11: 4 mg via INTRAVENOUS

## 2015-11-11 MED ORDER — DEXAMETHASONE SODIUM PHOSPHATE 10 MG/ML IJ SOLN
INTRAMUSCULAR | Status: AC
  Filled 2015-11-11: qty 1

## 2015-11-11 MED ORDER — SODIUM CHLORIDE 0.9 % IR SOLN
Status: DC | PRN
  Administered 2015-11-11: 50 mL

## 2015-11-11 MED ORDER — EPHEDRINE SULFATE 50 MG/ML IJ SOLN
INTRAMUSCULAR | Status: DC | PRN
  Administered 2015-11-11 (×2): 10 mg via INTRAVENOUS

## 2015-11-11 MED ORDER — OXYCODONE HCL 5 MG PO TABS
ORAL_TABLET | ORAL | Status: AC
  Filled 2015-11-11: qty 1

## 2015-11-11 MED ORDER — SCOPOLAMINE 1 MG/3DAYS TD PT72: 1.0000 | MEDICATED_PATCH | Freq: Once | TRANSDERMAL | Status: DC | PRN

## 2015-11-11 MED ORDER — MIDAZOLAM HCL 2 MG/2ML IJ SOLN
INTRAMUSCULAR | Status: AC
  Filled 2015-11-11: qty 2

## 2015-11-11 MED ORDER — CLINDAMYCIN PHOSPHATE 600 MG/50ML IV SOLN
INTRAVENOUS | Status: AC
  Filled 2015-11-11: qty 50

## 2015-11-11 MED ORDER — MUPIROCIN 2 % EX OINT
TOPICAL_OINTMENT | CUTANEOUS | Status: DC | PRN
  Administered 2015-11-11: 1 via NASAL

## 2015-11-11 MED ORDER — SUCCINYLCHOLINE CHLORIDE 20 MG/ML IJ SOLN
INTRAMUSCULAR | Status: DC | PRN
  Administered 2015-11-11: 100 mg via INTRAVENOUS

## 2015-11-11 MED ORDER — CLINDAMYCIN HCL 300 MG PO CAPS: 300.0000 mg | ORAL_CAPSULE | Freq: Three times a day (TID) | ORAL | Status: DC

## 2015-11-11 MED ORDER — MUPIROCIN 2 % EX OINT
TOPICAL_OINTMENT | CUTANEOUS | Status: AC
  Filled 2015-11-11: qty 22

## 2015-11-11 MED ORDER — OXYMETAZOLINE HCL 0.05 % NA SOLN
NASAL | Status: AC
  Filled 2015-11-11: qty 15

## 2015-11-11 MED ORDER — LIDOCAINE-EPINEPHRINE 1 %-1:100000 IJ SOLN
INTRAMUSCULAR | Status: AC
  Filled 2015-11-11: qty 1

## 2015-11-11 MED ORDER — OXYCODONE-ACETAMINOPHEN 5-325 MG PO TABS: 1.0000 | ORAL_TABLET | ORAL | Status: DC | PRN

## 2015-11-11 MED ORDER — CLINDAMYCIN PHOSPHATE 600 MG/50ML IV SOLN
INTRAVENOUS | Status: DC | PRN
  Administered 2015-11-11: 600 mg via INTRAVENOUS

## 2015-11-11 MED ORDER — HYDROMORPHONE HCL 1 MG/ML IJ SOLN
0.2500 mg | INTRAMUSCULAR | Status: DC | PRN
  Administered 2015-11-11 (×3): 0.5 mg via INTRAVENOUS

## 2015-11-11 MED ORDER — LACTATED RINGERS IV SOLN
INTRAVENOUS | Status: DC
  Administered 2015-11-11: 11:00:00 via INTRAVENOUS
  Administered 2015-11-11: 10 mL/h via INTRAVENOUS

## 2015-11-11 MED ORDER — FENTANYL CITRATE (PF) 100 MCG/2ML IJ SOLN
50.0000 ug | INTRAMUSCULAR | Status: DC | PRN
  Administered 2015-11-11: 50 ug via INTRAVENOUS

## 2015-11-11 MED ORDER — OXYCODONE HCL 5 MG PO TABS
5.0000 mg | ORAL_TABLET | Freq: Once | ORAL | Status: AC | PRN
  Administered 2015-11-11: 5 mg via ORAL

## 2015-11-11 MED ORDER — DEXAMETHASONE SODIUM PHOSPHATE 4 MG/ML IJ SOLN
INTRAMUSCULAR | Status: DC | PRN
  Administered 2015-11-11: 10 mg via INTRAVENOUS

## 2015-11-11 SURGICAL SUPPLY — 58 items
ATTRACTOMAT 16X20 MAGNETIC DRP (DRAPES) ×2 IMPLANT
BLADE RAD40 ROTATE 4M 4 5PK (BLADE) IMPLANT
BLADE RAD40 ROTATE 4M 4MM 5PK (BLADE)
BLADE RAD60 ROTATE M4 4 5PK (BLADE) IMPLANT
BLADE RAD60 ROTATE M4 4MM 5PK (BLADE)
BLADE TRICUT ROTATE M4 4 5PK (BLADE) ×1 IMPLANT
BLADE TRICUT ROTATE M4 4MM 5PK (BLADE) ×1
BUR HS RAD FRONTAL 3 (BURR) IMPLANT
BUR HS RAD FRONTAL 3MM (BURR)
CANISTER SUC SOCK COL 7IN (MISCELLANEOUS) ×6 IMPLANT
CANISTER SUCT 1200ML W/VALVE (MISCELLANEOUS) ×3 IMPLANT
COAGULATOR SUCT 6 FR SWTCH (ELECTROSURGICAL)
COAGULATOR SUCT 8FR VV (MISCELLANEOUS) ×3 IMPLANT
COAGULATOR SUCT SWTCH 10FR 6 (ELECTROSURGICAL) IMPLANT
DECANTER SPIKE VIAL GLASS SM (MISCELLANEOUS) IMPLANT
DRSG NASAL KENNEDY LMNT 8CM (GAUZE/BANDAGES/DRESSINGS) IMPLANT
DRSG NASOPORE 8CM (GAUZE/BANDAGES/DRESSINGS) IMPLANT
DRSG TELFA 3X8 NADH (GAUZE/BANDAGES/DRESSINGS) IMPLANT
ELECT REM PT RETURN 9FT ADLT (ELECTROSURGICAL) ×3
ELECTRODE REM PT RTRN 9FT ADLT (ELECTROSURGICAL) ×1 IMPLANT
GLOVE BIO SURGEON STRL SZ7.5 (GLOVE) ×3 IMPLANT
GLOVE BIOGEL PI IND STRL 8 (GLOVE) IMPLANT
GLOVE BIOGEL PI INDICATOR 8 (GLOVE) ×2
GLOVE SURG SS PI 8.0 STRL IVOR (GLOVE) ×2 IMPLANT
GOWN STRL REUS W/ TWL LRG LVL3 (GOWN DISPOSABLE) ×2 IMPLANT
GOWN STRL REUS W/ TWL XL LVL3 (GOWN DISPOSABLE) IMPLANT
GOWN STRL REUS W/TWL LRG LVL3 (GOWN DISPOSABLE) ×3
GOWN STRL REUS W/TWL XL LVL3 (GOWN DISPOSABLE) ×3
HEMOSTAT SURGICEL 2X14 (HEMOSTASIS) IMPLANT
IV NS 500ML (IV SOLUTION) ×3
IV NS 500ML BAXH (IV SOLUTION) ×1 IMPLANT
IV SET EXT 30 76VOL 4 MALE LL (IV SETS) IMPLANT
NDL HYPO 25X1 1.5 SAFETY (NEEDLE) ×1 IMPLANT
NEEDLE HYPO 25X1 1.5 SAFETY (NEEDLE) ×3 IMPLANT
NEEDLE SPNL 25GX3.5 QUINCKE BL (NEEDLE) IMPLANT
NS IRRIG 1000ML POUR BTL (IV SOLUTION) ×3 IMPLANT
PACK BASIN DAY SURGERY FS (CUSTOM PROCEDURE TRAY) ×3 IMPLANT
PACK ENT DAY SURGERY (CUSTOM PROCEDURE TRAY) ×3 IMPLANT
PACKING NASAL EPIS 4X2.4 XEROG (MISCELLANEOUS) ×2 IMPLANT
PAD DRESSING TELFA 3X8 NADH (GAUZE/BANDAGES/DRESSINGS) IMPLANT
SLEEVE SCD COMPRESS KNEE MED (MISCELLANEOUS) ×2 IMPLANT
SOLUTION BUTLER CLEAR DIP (MISCELLANEOUS) ×3 IMPLANT
SPLINT NASAL AIRWAY SILICONE (MISCELLANEOUS) ×2 IMPLANT
SPONGE GAUZE 2X2 8PLY STER LF (GAUZE/BANDAGES/DRESSINGS) ×1
SPONGE GAUZE 2X2 8PLY STRL LF (GAUZE/BANDAGES/DRESSINGS) ×2 IMPLANT
SPONGE NEURO XRAY DETECT 1X3 (DISPOSABLE) ×3 IMPLANT
SUT CHROMIC 4 0 P 3 18 (SUTURE) ×3 IMPLANT
SUT ETHILON 3 0 PS 1 (SUTURE) IMPLANT
SUT PLAIN 4 0 ~~LOC~~ 1 (SUTURE) ×3 IMPLANT
SUT PROLENE 3 0 PS 2 (SUTURE) ×3 IMPLANT
SUT VIC AB 4-0 P-3 18XBRD (SUTURE) IMPLANT
SUT VIC AB 4-0 P3 18 (SUTURE)
TOWEL OR 17X24 6PK STRL BLUE (TOWEL DISPOSABLE) ×3 IMPLANT
TUBE CONNECTING 20'X1/4 (TUBING) ×1
TUBE CONNECTING 20X1/4 (TUBING) ×2 IMPLANT
TUBE SALEM SUMP 12R W/ARV (TUBING) IMPLANT
TUBE SALEM SUMP 16 FR W/ARV (TUBING) ×3 IMPLANT
YANKAUER SUCT BULB TIP NO VENT (SUCTIONS) ×3 IMPLANT

## 2015-11-11 NOTE — Transfer of Care (Signed)
Immediate Anesthesia Transfer of Care Note  Patient: Mary Leach  Procedure(s) Performed: Procedure(s): SEPTOPLASTY BILATERAL TURBINATE  REDUCTION  (Bilateral) BILATERAL ENDOSCOPIC  MAXILLARY ANTROSTOMY (Bilateral)  Patient Location: PACU  Anesthesia Type:General  Level of Consciousness: awake  Airway & Oxygen Therapy: Patient Spontanous Breathing and Patient connected to face mask oxygen  Post-op Assessment: Report given to RN and Post -op Vital signs reviewed and stable  Post vital signs: Reviewed and stable  Last Vitals:  Filed Vitals:   11/11/15 0854  BP: 102/59  Pulse: 63  Temp: 36.8 C  Resp: 16    Last Pain: There were no vitals filed for this visit.       Complications: No apparent anesthesia complications

## 2015-11-11 NOTE — Discharge Instructions (Addendum)

## 2015-11-11 NOTE — Anesthesia Postprocedure Evaluation (Signed)
Anesthesia Post Note  Patient: Selby Hubbs  Procedure(s) Performed: Procedure(s) (LRB): SEPTOPLASTY BILATERAL TURBINATE  REDUCTION  (Bilateral) BILATERAL ENDOSCOPIC  MAXILLARY ANTROSTOMY (Bilateral)  Patient location during evaluation: PACU Anesthesia Type: General Level of consciousness: awake and alert Pain management: pain level controlled Vital Signs Assessment: post-procedure vital signs reviewed and stable Respiratory status: spontaneous breathing, nonlabored ventilation and respiratory function stable Cardiovascular status: blood pressure returned to baseline and stable Postop Assessment: no signs of nausea or vomiting Anesthetic complications: no    Last Vitals:  Filed Vitals:   11/11/15 1257 11/11/15 1330  BP: 156/86   Pulse: 68 77  Temp: 37.1 C 36.9 C  Resp: 16 20    Last Pain:  Filed Vitals:   11/11/15 1344  PainSc: 2                  Loletta Harper A

## 2015-11-11 NOTE — H&P (Signed)
Cc: Recurrent sinusitis, facial pain  HPI: The patient is a 65 year old female who presents today complaining of more recurrent sinusitis, facial pain, facial pressure and fatigue. She was treated with 3 courses of antibiotics and 1 course of prednisone for the past 2 months.  The patient returns complaining of persistent nasal congestion.  She underwent a CT scan earlier this month. The CT showed significant nasal septal deviation, bilateral inferior turbinate hypertrophy and mucosal thickening of her bilateral maxillary sinuses.  In addition, both ostiomeatal complexes were also obstructed by mucosal edema.  The patient is currently on Dymista daily.  She is interested in more definitive treatment of her sinonasal nasal congestion.   Exam: The nasal cavities were decongested and anesthetised with a combination of oxymetazoline and 4% lidocaine solution.  The flexible scope was inserted into the right nasal cavity.  Endoscopy of the inferior and middle meatus was performed.  Edematous mucosa was noted. Mucoid drainage appeared to originate from the middle meatus. No polyp, mass, or lesion was appreciated.  Olfactory cleft was clear.  Nasopharynx was clear.  Turbinates were hypertrophied but without mass. NSD noted. The procedure was repeated on the contralateral side with similar findings.  The patient tolerated the procedure well.  Instructions were given to avoid eating or drinking for 2 hours.    Assessment: 1.  Chronic rhinitis with nasal septal deviation and bilateral inferior turbinate hypertrophy.  2.  Bilateral recurrent maxillary sinusitis, with occlusions of her ostiomeatal complexes bilaterally.   3.  The rest of her paranasal sinuses are clear.   Plan: 1.  The nasal endoscopy findings and the CT images are reviewed with the patient.  2.  In light of her frequent recurrent symptoms, she may benefit from undergoing surgical intervention to improve her sinonasal drainage and nasal airways.   The surgery will likely involve bilateral maxillary antrostomy, septoplasty, and bilateral inferior turbinate reduction.  3.  The risks, benefits, alternatives and details of the procedures were discussed with the patient.  Questions are invited and answered.  4.  The patient would like to proceed with the procedures.

## 2015-11-11 NOTE — Anesthesia Procedure Notes (Signed)
Procedure Name: Intubation Date/Time: 11/11/2015 10:05 AM Performed by: Lieutenant Diego Pre-anesthesia Checklist: Patient identified, Emergency Drugs available, Suction available and Patient being monitored Patient Re-evaluated:Patient Re-evaluated prior to inductionOxygen Delivery Method: Circle system utilized Preoxygenation: Pre-oxygenation with 100% oxygen Intubation Type: IV induction Ventilation: Mask ventilation without difficulty Laryngoscope Size: Miller, 2, Mac and 3 Grade View: Grade II Tube type: Oral Number of attempts: 1 Airway Equipment and Method: Stylet and Oral airway Placement Confirmation: ETT inserted through vocal cords under direct vision,  positive ETCO2 and breath sounds checked- equal and bilateral Secured at: 21 cm Tube secured with: Tape Dental Injury: Teeth and Oropharynx as per pre-operative assessment

## 2015-11-11 NOTE — Anesthesia Preprocedure Evaluation (Signed)
Anesthesia Evaluation  Patient identified by MRN, date of birth, ID band Patient awake    Reviewed: Allergy & Precautions, NPO status , Patient's Chart, lab work & pertinent test results  Airway Mallampati: I  TM Distance: >3 FB Neck ROM: Full    Dental  (+) Teeth Intact, Dental Advisory Given   Pulmonary  breath sounds clear to auscultation        Cardiovascular Rhythm:Regular Rate:Normal     Neuro/Psych    GI/Hepatic   Endo/Other    Renal/GU      Musculoskeletal   Abdominal   Peds  Hematology   Anesthesia Other Findings   Reproductive/Obstetrics                             Anesthesia Physical Anesthesia Plan  ASA: II  Anesthesia Plan: General   Post-op Pain Management:    Induction: Intravenous  Airway Management Planned: Oral ETT  Additional Equipment:   Intra-op Plan:   Post-operative Plan: Extubation in OR  Informed Consent: I have reviewed the patients History and Physical, chart, labs and discussed the procedure including the risks, benefits and alternatives for the proposed anesthesia with the patient or authorized representative who has indicated his/her understanding and acceptance.   Dental advisory given  Plan Discussed with: CRNA, Anesthesiologist and Surgeon  Anesthesia Plan Comments:         Anesthesia Quick Evaluation  

## 2015-11-11 NOTE — Op Note (Signed)
DATE OF PROCEDURE: 11/11/2015  OPERATIVE REPORT   SURGEON: Leta Baptist, MD   PREOPERATIVE DIAGNOSES:  1. Severe nasal septal deviation.  2. Bilateral inferior turbinate hypertrophy.  3. Chronic nasal obstruction. 4. Bilateral chronic maxillary sinusitis.  POSTOPERATIVE DIAGNOSES:  1. Severe nasal septal deviation.  2. Bilateral inferior turbinate hypertrophy.  3. Chronic nasal obstruction. 4. Bilateral chronic maxillary sinusitis.  PROCEDURE PERFORMED:  1. Septoplasty.  2. Bilateral partial inferior turbinate resection.  3. Bilateral endoscopic maxillary antrostomy  ANESTHESIA: General endotracheal tube anesthesia.   COMPLICATIONS: None.   ESTIMATED BLOOD LOSS: Less than 100 mL.   INDICATION FOR PROCEDURE: Mary Leach is a 65 y.o. female with a history of recurrent sinusitis and chronic nasal obstruction. The patient was  treated with multiple courses of antibiotics, antihistamine, decongestant, steroid nasal spray, and systemic steroids. However, the patient continues to be symptomatic. On examination, the patient was noted to have bilateral severe inferior turbinate hypertrophy and significant nasal septal deviation, causing significant nasal obstruction. Her sinus CT scan also showed occlusion of her bilateral ostiomeatal complex. Based on the above findings, the decision was made for the patient to undergo the above-stated procedures. The risks, benefits, alternatives, and details of the procedure were discussed with the patient. Questions were invited and answered. Informed consent was obtained.   DESCRIPTION OF PROCEDURE: The patient was taken to the operating room and placed supine on the operating table. General endotracheal tube anesthesia was administered by the anesthesiologist. The patient was positioned, and prepped and draped in the standard fashion for nasal surgery. Pledgets soaked with Afrin were placed in both nasal cavities for decongestion. The pledgets were subsequently  removed. The above mentioned severe septal deviation was again noted. 1% lidocaine with 1:100,000 epinephrine was injected onto the nasal septum bilaterally. A hemitransfixion incision was made on the left side. The mucosal flap was carefully elevated on the left side. A cartilaginous incision was made 1 cm superior to the caudal margin of the nasal septum. Mucosal flap was also elevated on the right side in the similar fashion. It should be noted that due to the severe septal deviation, the deviated portion of the cartilaginous and bony septum had to be removed in piecemeal fashion. Once the deviated portions were removed, a straight midline septum was achieved. The septum was then quilted with 4-0 plain gut sutures. The hemitransfixion incision was closed with interrupted 4-0 chromic sutures.   Attention was then focused on the paranasal sinuses. Using a 0 scope, the left nasal cavity was examined. The middle turbinate was medialized. The uncinate process was removed using a freer elevator. The maxillary antrum was then entered and enlarged using a combination of backbiter, Tru-Cut forceps, and microdebrider. The maxillary sinus was copiously irrigated. The same procedure was repeated on the left side without exception.  The inferior one half of both hypertrophied inferior turbinate was crossclamped with a Kelly clamp. The inferior one half of each inferior turbinate was then resected with a pair of cross cutting scissors. Hemostasis was achieved with a suction cautery device.   The care of the patient was turned over to the anesthesiologist. The patient was awakened from anesthesia without difficulty. The patient was extubated and transferred to the recovery room in good condition.   OPERATIVE FINDINGS: Severe nasal septal deviation and bilateral inferior turbinate hypertrophy. Mucosal edema and obstruction of bilateral ostiomeatal complexes.  SPECIMEN: None.   FOLLOWUP CARE: The patient be  discharged home once she is awake and alert. The patient will  be placed on Percocet 1 tablets p.o. q.4 hours p.r.n. pain, and clindamycin 300 mg p.o. t.i.d. for 5 days. The patient will follow up in my office in approximately 1 week for splint removal.   Elliana Bal Raynelle Bring, MD

## 2015-11-12 ENCOUNTER — Encounter (HOSPITAL_BASED_OUTPATIENT_CLINIC_OR_DEPARTMENT_OTHER): Payer: Self-pay | Admitting: Otolaryngology

## 2015-11-12 NOTE — Addendum Note (Signed)
Addendum  created 11/12/15 1204 by Tawni Millers, CRNA   Modules edited: Charges VN

## 2015-12-08 LAB — HM COLONOSCOPY

## 2015-12-12 ENCOUNTER — Encounter: Payer: Self-pay | Admitting: Family Medicine

## 2015-12-19 ENCOUNTER — Encounter: Payer: Self-pay | Admitting: Family Medicine

## 2015-12-29 NOTE — Progress Notes (Signed)
65 y.o. G2P2 Married Caucasian female here for annual exam.    Will start Vivelle Dot, progesterone, and testosterone through [facility] in [place].  She wants treatment of celiac and hormonal tx.  Feels sleep quality is poor.  Notes a bony prominence in her foot that has gone away.   Has a change in a mole on her back and saw dermatology.  Wants a second opinion.   PCP:   Dr. Redmond School.  Patient's last menstrual period was 05/24/2006.           Sexually active: Yes.    The current method of family planning is post menopausal status.    Exercising: Yes.    Yoga, Weight-training, Aerobics Smoker:  no  Health Maintenance: Pap:  12-26-14 Neg:Neg HR HPV History of abnormal Pap:  no MMG:  08-08-15 3D/Density B/Neg/BiRads1:[facility] Colonoscopy:  12/08/15 - 1 polyp.  Dr. Collene Mares.  Due again in 2022. BMD: 06/19/2014  Result  Osteopenia, Spine T-1.5, Femur T-2.0 TDaP:  06/26/2009 Gardasil:   no HIV: 04/06/2006 Hep C: 04/06/2006 Screening Labs: PCP Hb today:  11.4.  Urine today: PCP   reports that she has never smoked. She has never used smokeless tobacco. She reports that she drinks about 1.8 oz of alcohol per week . She reports that she does not use drugs.  Past Medical History:  Diagnosis Date  . ADD (attention deficit disorder)   . Allergy    RHINITIS  . Anxiety   . Asthma   . Asthma 10/18/2011  . Bipolar disorder ([place])   . Celiac disease   . Depression   . Gluten free diet    allergic to gluten  . History of colonic polyps   . Ileus following gastrointestinal surgery 10/18/2011  . Malnutrition following gastrointestinal surgery 10/18/2011  . RBBB (right bundle branch block)     Past Surgical History:  Procedure Laterality Date  . COLON SURGERY    . COLONOSCOPY W/ POLYPECTOMY    . COLOSTOMY TAKEDOWN  05/29/2012   Procedure: LAPAROSCOPIC COLOSTOMY TAKEDOWN;  Surgeon: Odis Hollingshead, MD;  Location: [facility];  Service: General;  Laterality: N/A;   Laparoscopic Assisted Colostomy Closure  with proctoscopy  . CYSTECTOMY     LEFT FOOT, RIGHT HAND  . FOOT SURGERY    . LAPAROTOMY  10/07/2011   Procedure: EXPLORATORY LAPAROTOMY;  Surgeon: Odis Hollingshead, MD;  Location: [facility];  Service: General;  Laterality: N/A;  hartmann procedure and creation of colostomy  . NASAL SEPTOPLASTY W/ TURBINOPLASTY Bilateral 11/11/2015   Procedure: SEPTOPLASTY BILATERAL TURBINATE  REDUCTION ;  Surgeon: Leta Baptist, MD;  Location: [place];  Service: ENT;  Laterality: Bilateral;  . NASAL SINUS SURGERY Bilateral 11/11/2015   Procedure: BILATERAL ENDOSCOPIC  MAXILLARY ANTROSTOMY;  Surgeon: Leta Baptist, MD;  Location: [place];  Service: ENT;  Laterality: Bilateral;  . TONSILLECTOMY AND ADENOIDECTOMY    . TUBAL LIGATION    . UPPER GASTROINTESTINAL ENDOSCOPY  01/19/12    Current Outpatient Prescriptions  Medication Sig Dispense Refill  . ADVAIR DISKUS 100-50 MCG/DOSE AEPB Inhale 1 puff into the lungs 2 (two) times daily.     . Azelastine-Fluticasone (DYMISTA) 137-50 MCG/ACT SUSP Place 2 sprays into the nose 2 (two) times daily. 1 Bottle 11  . B Complex-Biotin-FA (B-COMPLEX PO) Take 100 mg by mouth daily.    Marland Kitchen Bioflavonoid Products (C COMPLEX PO) Take 500 mg by mouth daily.     . Biotin 10  MG CAPS Take 1 capsule by mouth daily.    . Cholecalciferol (VITAMIN D) 2000 units tablet Take 2,000 Units by mouth daily.    . clindamycin (CLEOCIN) 300 MG capsule Take 1 capsule (300 mg total) by mouth 3 (three) times daily. 15 capsule 0  . EPINEPHrine (EPI-PEN) 0.3 mg/0.3 mL DEVI Inject 0.3 mg into the muscle once. For previous allergies, maybe gluten    . multivitamin-lutein (OCUVITE-LUTEIN) CAPS Take 1 capsule by mouth daily.    . Probiotic Product (PROBIOTIC DAILY PO) Take by mouth. metaflora IB    . selenium 50 MCG TABS tablet Take 50 mcg by mouth daily.    . Thyroid (NATURE-THROID PO) Take by mouth daily. 1 grain    . UNABLE TO FIND daily. Med  Name: I-Throid 6.27m 1 daily m-w-f    . VITAMIN K PO Take 150 mcg by mouth daily.    .Marland KitchenVIVELLE-DOT 0.0375 MG/24HR     . Zinc 22.5 MG TABS Take 22.5 mg by mouth daily.     No current facility-administered medications for this visit.     Family History  Problem Relation Age of Onset  . Heart disease Father   . Osteoporosis Father   . Mental illness Paternal Aunt   . Cancer Maternal Grandmother     SKIN  . Hypertension Maternal Grandfather   . Heart disease Paternal Grandfather   . Osteoporosis Mother     ROS:  Pertinent items are noted in HPI.  Otherwise, a comprehensive ROS was negative.  Exam:   BP 112/60 (BP Location: Right Arm, Patient Position: Sitting, Cuff Size: Small)   Pulse 64   Resp 14   Ht 5' 4.25" (1.632 m)   Wt 120 lb 3.2 oz (54.5 kg)   LMP 05/24/2006   BMI 20.47 kg/m     General appearance: alert, cooperative and appears stated age Head: Normocephalic, without obvious abnormality, atraumatic Neck: no adenopathy, supple, symmetrical, trachea midline and thyroid normal to inspection and palpation Lungs: clear to auscultation bilaterally Breasts: normal appearance, no masses or tenderness, No nipple retraction or dimpling, No nipple discharge or bleeding, No axillary or supraclavicular adenopathy Heart: regular rate and rhythm Abdomen: vertical midline incision and left mid incision.  Small umbilical hernia, soft, non-tender; no masses, no organomegaly Extremities: extremities normal, atraumatic, no cyanosis or edema Skin: Skin color, texture, turgor normal. No rashes or lesions.  Right mid back with 7 - 8 mm area of dry raised light brown nevus consistent with seborrheic keratosis.  Lymph nodes: Cervical, supraclavicular, and axillary nodes normal. No abnormal inguinal nodes palpated Neurologic: Grossly normal  Pelvic: External genitalia:  no lesions              Urethra:  normal appearing urethra with no masses, tenderness or lesions              Bartholins  and Skenes: normal                 Vagina: normal appearing vagina with normal color and discharge, no lesions              Cervix: no lesions              Pap taken: No. Bimanual Exam:  Uterus:  normal size, contour, position, consistency, mobility, non-tender              Adnexa: no mass, fullness, tenderness              Rectal exam:  Yes.  .  Confirms.              Anus:  normal sphincter tone, no lesions  Chaperone was present for exam.  Assessment:   Well woman visit with normal exam. Osteopenia.  Insomnia.   Plan: Yearly mammogram recommended after age 50.  Recommended self breast exam.  Pap and HR HPV as above. Discussed Calcium, Vitamin D, regular exercise program including cardiovascular and weight bearing exercise. BMD next year.  Follow up annually and prn.   Additional counseling given.  Yes.  . __15_____ minutes face to face time of which over 50% was spent in counseling regarding postmenopausal HRT.  We discussed the WHI and increased risk of DVT, PE, MI, stroke, and breast cancer. We discussed benefits of reducing risk of osteoporosis and colon cancer.  I do not recommend she proceed forward with this treatment, but this will be a decision between her and her provider.   We did discuss alternatives such as sleep evaluation through her PCP or neurology.     After visit summary provided.

## 2015-12-31 ENCOUNTER — Encounter: Payer: Self-pay | Admitting: Obstetrics and Gynecology

## 2015-12-31 ENCOUNTER — Ambulatory Visit: Payer: Commercial Managed Care - HMO | Admitting: Obstetrics and Gynecology

## 2015-12-31 VITALS — BP 112/60 | HR 64 | Resp 14 | Ht 64.25 in | Wt 120.2 lb

## 2015-12-31 DIAGNOSIS — G4700 Insomnia, unspecified: Secondary | ICD-10-CM

## 2015-12-31 DIAGNOSIS — Z01419 Encounter for gynecological examination (general) (routine) without abnormal findings: Secondary | ICD-10-CM | POA: Diagnosis not present

## 2015-12-31 DIAGNOSIS — Z780 Asymptomatic menopausal state: Secondary | ICD-10-CM

## 2015-12-31 NOTE — Patient Instructions (Addendum)
Health Maintenance, Female Adopting a healthy lifestyle and getting preventive care can go a long way to promote health and wellness. Talk with your health care provider about what schedule of regular examinations is right for you. This is a good chance for you to check in with your provider about disease prevention and staying healthy. In between checkups, there are plenty of things you can do on your own. Experts have done a lot of research about which lifestyle changes and preventive measures are most likely to keep you healthy. Ask your health care provider for more information. WEIGHT AND DIET  Eat a healthy diet  Be sure to include plenty of vegetables, fruits, low-fat dairy products, and lean protein.  Do not eat a lot of foods high in solid fats, added sugars, or salt.  Get regular exercise. This is one of the most important things you can do for your health.  Most adults should exercise for at least 150 minutes each week. The exercise should increase your heart rate and make you sweat (moderate-intensity exercise).  Most adults should also do strengthening exercises at least twice a week. This is in addition to the moderate-intensity exercise.  Maintain a healthy weight  Body mass index (BMI) is a measurement that can be used to identify possible weight problems. It estimates body fat based on height and weight. Your health care provider can help determine your BMI and help you achieve or maintain a healthy weight.  For females 20 years of age and older:   A BMI below 18.5 is considered underweight.  A BMI of 18.5 to 24.9 is normal.  A BMI of 25 to 29.9 is considered overweight.  A BMI of 30 and above is considered obese.  Watch levels of cholesterol and blood lipids  You should start having your blood tested for lipids and cholesterol at 64 years of age, then have this test every 5 years.  You may need to have your cholesterol levels checked more often if:  Your lipid  or cholesterol levels are high.  You are older than 64 years of age.  You are at high risk for heart disease.  CANCER SCREENING   Lung Cancer  Lung cancer screening is recommended for adults 55-80 years old who are at high risk for lung cancer because of a history of smoking.  A yearly low-dose CT scan of the lungs is recommended for people who:  Currently smoke.  Have quit within the past 15 years.  Have at least a 30-pack-year history of smoking. A pack year is smoking an average of one pack of cigarettes a day for 1 year.  Yearly screening should continue until it has been 15 years since you quit.  Yearly screening should stop if you develop a health problem that would prevent you from having lung cancer treatment.  Breast Cancer  Practice breast self-awareness. This means understanding how your breasts normally appear and feel.  It also means doing regular breast self-exams. Let your health care provider know about any changes, no matter how small.  If you are in your 20s or 30s, you should have a clinical breast exam (CBE) by a health care provider every 1-3 years as part of a regular health exam.  If you are 40 or older, have a CBE every year. Also consider having a breast X-ray (mammogram) every year.  If you have a family history of breast cancer, talk to your health care provider about genetic screening.  If you   are at high risk for breast cancer, talk to your health care provider about having an MRI and a mammogram every year.  Breast cancer gene (BRCA) assessment is recommended for women who have family members with BRCA-related cancers. BRCA-related cancers include:  Breast.  Ovarian.  Tubal.  Peritoneal cancers.  Results of the assessment will determine the need for genetic counseling and BRCA1 and BRCA2 testing. Cervical Cancer Your health care provider may recommend that you be screened regularly for cancer of the pelvic organs (ovaries, uterus, and  vagina). This screening involves a pelvic examination, including checking for microscopic changes to the surface of your cervix (Pap test). You may be encouraged to have this screening done every 3 years, beginning at age 21.  For women ages 30-65, health care providers may recommend pelvic exams and Pap testing every 3 years, or they may recommend the Pap and pelvic exam, combined with testing for human papilloma virus (HPV), every 5 years. Some types of HPV increase your risk of cervical cancer. Testing for HPV may also be done on women of any age with unclear Pap test results.  Other health care providers may not recommend any screening for nonpregnant women who are considered low risk for pelvic cancer and who do not have symptoms. Ask your health care provider if a screening pelvic exam is right for you.  If you have had past treatment for cervical cancer or a condition that could lead to cancer, you need Pap tests and screening for cancer for at least 20 years after your treatment. If Pap tests have been discontinued, your risk factors (such as having a new sexual partner) need to be reassessed to determine if screening should resume. Some women have medical problems that increase the chance of getting cervical cancer. In these cases, your health care provider may recommend more frequent screening and Pap tests. Colorectal Cancer  This type of cancer can be detected and often prevented.  Routine colorectal cancer screening usually begins at 64 years of age and continues through 64 years of age.  Your health care provider may recommend screening at an earlier age if you have risk factors for colon cancer.  Your health care provider may also recommend using home test kits to check for hidden blood in the stool.  A small camera at the end of a tube can be used to examine your colon directly (sigmoidoscopy or colonoscopy). This is done to check for the earliest forms of colorectal  cancer.  Routine screening usually begins at age 50.  Direct examination of the colon should be repeated every 5-10 years through 64 years of age. However, you may need to be screened more often if early forms of precancerous polyps or small growths are found. Skin Cancer  Check your skin from head to toe regularly.  Tell your health care provider about any new moles or changes in moles, especially if there is a change in a mole's shape or color.  Also tell your health care provider if you have a mole that is larger than the size of a pencil eraser.  Always use sunscreen. Apply sunscreen liberally and repeatedly throughout the day.  Protect yourself by wearing long sleeves, pants, a wide-brimmed hat, and sunglasses whenever you are outside. HEART DISEASE, DIABETES, AND HIGH BLOOD PRESSURE   High blood pressure causes heart disease and increases the risk of stroke. High blood pressure is more likely to develop in:  People who have blood pressure in the high end   of the normal range (130-139/85-89 mm Hg).  People who are overweight or obese.  People who are African American.  If you are 38-23 years of age, have your blood pressure checked every 3-5 years. If you are 61 years of age or older, have your blood pressure checked every year. You should have your blood pressure measured twice--once when you are at a hospital or clinic, and once when you are not at a hospital or clinic. Record the average of the two measurements. To check your blood pressure when you are not at a hospital or clinic, you can use:  An automated blood pressure machine at a pharmacy.  A home blood pressure monitor.  If you are between 45 years and 39 years old, ask your health care provider if you should take aspirin to prevent strokes.  Have regular diabetes screenings. This involves taking a blood sample to check your fasting blood sugar level.  If you are at a normal weight and have a low risk for diabetes,  have this test once every three years after 64 years of age.  If you are overweight and have a high risk for diabetes, consider being tested at a younger age or more often. PREVENTING INFECTION  Hepatitis B  If you have a higher risk for hepatitis B, you should be screened for this virus. You are considered at high risk for hepatitis B if:  You were born in a country where hepatitis B is common. Ask your health care provider which countries are considered high risk.  Your parents were born in a high-risk country, and you have not been immunized against hepatitis B (hepatitis B vaccine).  You have HIV or AIDS.  You use needles to inject street drugs.  You live with someone who has hepatitis B.  You have had sex with someone who has hepatitis B.  You get hemodialysis treatment.  You take certain medicines for conditions, including cancer, organ transplantation, and autoimmune conditions. Hepatitis C  Blood testing is recommended for:  Everyone born from [birth year] through 1964.  Anyone with known risk factors for hepatitis C. Sexually transmitted infections (STIs)  You should be screened for sexually transmitted infections (STIs) including gonorrhea and chlamydia if:  You are sexually active and are younger than 64 years of age.  You are older than 64 years of age and your health care provider tells you that you are at risk for this type of infection.  Your sexual activity has changed since you were last screened and you are at an increased risk for chlamydia or gonorrhea. Ask your health care provider if you are at risk.  If you do not have HIV, but are at risk, it may be recommended that you take a prescription medicine daily to prevent HIV infection. This is called pre-exposure prophylaxis (PrEP). You are considered at risk if:  You are sexually active and do not regularly use condoms or know the HIV status of your partner(s).  You take drugs by injection.  You are sexually  active with a partner who has HIV. Talk with your health care provider about whether you are at high risk of being infected with HIV. If you choose to begin PrEP, you should first be tested for HIV. You should then be tested every 3 months for as long as you are taking PrEP.  PREGNANCY   If you are premenopausal and you may become pregnant, ask your health care provider about preconception counseling.  If you may  become pregnant, take 400 to 800 micrograms (mcg) of folic acid every day.  If you want to prevent pregnancy, talk to your health care provider about birth control (contraception). OSTEOPOROSIS AND MENOPAUSE   Osteoporosis is a disease in which the bones lose minerals and strength with aging. This can result in serious bone fractures. Your risk for osteoporosis can be identified using a bone density scan.  If you are 15 years of age or older, or if you are at risk for osteoporosis and fractures, ask your health care provider if you should be screened.  Ask your health care provider whether you should take a calcium or vitamin D supplement to lower your risk for osteoporosis.  Menopause may have certain physical symptoms and risks.  Hormone replacement therapy may reduce some of these symptoms and risks. Talk to your health care provider about whether hormone replacement therapy is right for you.  HOME CARE INSTRUCTIONS   Schedule regular health, dental, and eye exams.  Stay current with your immunizations.   Do not use any tobacco products including cigarettes, chewing tobacco, or electronic cigarettes.  If you are pregnant, do not drink alcohol.  If you are breastfeeding, limit how much and how often you drink alcohol.  Limit alcohol intake to no more than 1 drink per day for nonpregnant women. One drink equals 12 ounces of beer, 5 ounces of wine, or 1 ounces of hard liquor.  Do not use street drugs.  Do not share needles.  Ask your health care provider for help if  you need support or information about quitting drugs.  Tell your health care provider if you often feel depressed.  Tell your health care provider if you have ever been abused or do not feel safe at home.   This information is not intended to replace advice given to you by your health care provider. Make sure you discuss any questions you have with your health care provider.   Document Released: 11/23/2010 Document Revised: 05/31/2014 Document Reviewed: 04/11/2013 Elsevier Interactive Patient Education 2016 [place] INITIATIVE STUDY WITH RESPECT TO THE RISKS AND BENEFITS OF HORMONE THERAPY.

## 2016-01-02 ENCOUNTER — Encounter: Payer: Self-pay | Admitting: Obstetrics and Gynecology

## 2016-01-05 ENCOUNTER — Ambulatory Visit (INDEPENDENT_AMBULATORY_CARE_PROVIDER_SITE_OTHER): Payer: Commercial Managed Care - HMO | Admitting: Family Medicine

## 2016-01-05 ENCOUNTER — Encounter: Payer: Self-pay | Admitting: Family Medicine

## 2016-01-05 VITALS — BP 114/70 | HR 88 | Wt 122.0 lb

## 2016-01-05 DIAGNOSIS — M79672 Pain in left foot: Secondary | ICD-10-CM

## 2016-01-05 NOTE — Progress Notes (Signed)
   Subjective:    Patient ID: Mary Leach, female    DOB: [birth year], 65 y.o.   MRN: [id number]  HPI She complains of a two-month history of left lateral foot pain. This started shortly after she changed her aerobic exercise to one that was more ballistic in nature. Shortly after that she did change her shoes and did express less discomfort. However with continuing with her aerobics, the pain has gotten worse and in fact did wake her up. At the end of the encounter she then mentioned the fact that she has had difficulty in the past and did have orthotics but has not been wearing them.   Review of Systems     Objective:   Physical Exam alert and in no distress. Full motion of the ankle. Normal motion of the foot and forefoot without any pain. She points to the posterior lateral malleolus is to where she is having the most discomfort.      Assessment & Plan:  Left foot pain I explained that most likely this is a tendinitis. Strongly encouraged her to use the orthotics and see if this would work. If not she may need a new orthotic. She was comfortable with this approach.

## 2016-01-19 ENCOUNTER — Other Ambulatory Visit: Payer: Self-pay | Admitting: Family Medicine

## 2016-01-19 MED ORDER — FLUOXETINE HCL 20 MG PO TABS: 20.0000 mg | ORAL_TABLET | Freq: Every day | ORAL | 3 refills | Status: DC

## 2016-01-19 NOTE — Telephone Encounter (Signed)
She states she is on 20 mg when zack move to [place] you put her on it I looked in her record and seen where she was on it but it looks like you never filled it . She states she has tried to come off it and it dont work

## 2016-01-19 NOTE — Telephone Encounter (Signed)
Pt needs refill on Prozac to [facility]

## 2016-01-19 NOTE — Telephone Encounter (Signed)
I have no record of this check with her

## 2016-02-11 ENCOUNTER — Ambulatory Visit (INDEPENDENT_AMBULATORY_CARE_PROVIDER_SITE_OTHER): Payer: Commercial Managed Care - HMO | Admitting: Family Medicine

## 2016-02-11 ENCOUNTER — Encounter: Payer: Self-pay | Admitting: Family Medicine

## 2016-02-11 VITALS — BP 120/70 | HR 68 | Ht 64.25 in | Wt 124.4 lb

## 2016-02-11 DIAGNOSIS — M79672 Pain in left foot: Secondary | ICD-10-CM | POA: Diagnosis not present

## 2016-02-11 NOTE — Patient Instructions (Signed)
Increase naproxen to 2 Aleve twice daily--take with food.  Cut back to just 1 at a time if it bothers your stomach in any way. Take this higher dose only for up to 2 weeks.  We are referring you to [place] [facility] for further evaluation. You might benefit from orthotics. I don't see any significant tendonitis on today's exam, although it sounds like that may have been part of the problem initially.  If you have worsening pain, follow up with the podiatrist--they may be able to offer other kinds of treatments (ie steroid injections). Based on today's exam, there doesn't seem to be significant inflammation or evidence of tendonitis.

## 2016-02-11 NOTE — Progress Notes (Signed)
Chief Complaint  Patient presents with  . Leg Pain    left leg pain that was aching yesterday and was hard to walk yesterday. Somewhat better today but still feels some soreness. Can walk better today.    In June she went to a harder aerobics class than usual, pulled something/tendonitis in her left lower lateral and anterior leg.  She had been wearing 64 yo shoes.  She went to the same class a few more times, and pain would be worse after class.  She had gotten new tennis shoes.  She would need to loosen her shoe periodically throughout the day due to it hurting.  Denies any visible swelling.   She hasn't done those aerobics classes since middle of June.  If she rested, it would get better, but when she did her "normal schedule of stuff"--caring for grandkids, shopping at [employer], etc, then pain would recur.  She reports she saw Dr. Redmond School recently for this, but it is hurting more and she didn't want to wait for him to return to be seen again.  She tried rest, as well as Aleve 1 BID for 7-10 days.  It helps somewhat, but would get worse again with increased activity.  Wears Vionic flip flops with her usual activities, but in the last month she changed to Birkenstocks.  Had pain walking in [employer] yesterday.  Had achey pain across her anterior ankle. She has used orthotics in the past, but never custom.  PMH, PSH, SH reviewed  Outpatient Encounter Prescriptions as of 02/11/2016  Medication Sig Note  . ADVAIR DISKUS 100-50 MCG/DOSE AEPB Inhale 1 puff into the lungs 2 (two) times daily.  12/14/2013: Received from: External Pharmacy  . Azelastine-Fluticasone (DYMISTA) 137-50 MCG/ACT SUSP Place 2 sprays into the nose 2 (two) times daily.   . B Complex-Biotin-FA (B-COMPLEX PO) Take 100 mg by mouth daily.   Marland Kitchen Bioflavonoid Products (C COMPLEX PO) Take 500 mg by mouth daily.  07/02/2015: Vitamin C  . Biotin 10 MG CAPS Take 1 capsule by mouth daily.   . Calcium-Magnesium-Vitamin D (CALCIUM 500 PO) Take 1  tablet by mouth daily.   . Cholecalciferol (VITAMIN D) 2000 units tablet Take 2,000 Units by mouth daily.   . fexofenadine (ALLEGRA) 180 MG tablet Take 180 mg by mouth daily.   Marland Kitchen FLUoxetine (PROZAC) 20 MG tablet Take 1 tablet (20 mg total) by mouth daily.   . multivitamin-lutein (OCUVITE-LUTEIN) CAPS Take 1 capsule by mouth daily.   . NON FORMULARY  02/11/2016: Testosterone cream 2%  . NON FORMULARY Take 2 tablets by mouth daily. 02/11/2016: Now liver detoxifier/Regenerator  . Omega-3 Fatty Acids (SALMON OIL PO) Take 1,300 mg by mouth daily.   . Probiotic Product (PROBIOTIC DAILY PO) Take by mouth. metaflora IB   . selenium 50 MCG TABS tablet Take 50 mcg by mouth daily.   . Thyroid (NATURE-THROID PO) Take by mouth daily. 1 grain   . UNABLE TO FIND daily. Med Name: I-Throid 6.25mg  1 daily m-w-f   . VITAMIN K PO Take 150 mcg by mouth daily.   Marland Kitchen VIVELLE-DOT 0.0375 MG/24HR  12/31/2015: Received from: External Pharmacy  . Zinc 22.5 MG TABS Take 22.5 mg by mouth daily.   Marland Kitchen EPINEPHrine (EPI-PEN) 0.3 mg/0.3 mL DEVI Inject 0.3 mg into the muscle once. For previous allergies, maybe gluten 11/11/2015: Has but not used   . [DISCONTINUED] clindamycin (CLEOCIN) 300 MG capsule Take 1 capsule (300 mg total) by mouth 3 (three) times daily. (Patient not  taking: Reported on 01/05/2016)    No facility-administered encounter medications on file as of 02/11/2016.    Allergies  Allergen Reactions  . Augmentin [Amoxicillin-Pot Clavulanate] Diarrhea  . Other     Multiple food intolerances. Eggs, corn, potatoes, yeast, millett, buckwheat, sesame oil, soy.  Also observes a dairy and wheat free diet.  . Sulfonamide Derivatives Rash    Arms only.   ROS: no numbness, tingling, weakness, rash, edema (rare).  No GI complaints, bleeding or bruising.  No fever, chills, URI symptoms or other complaints.  PHYSICAL EXAM: BP 120/70 (BP Location: Left Arm, Patient Position: Sitting, Cuff Size: Normal)   Pulse 68   Ht 5' 4.25"  (1.632 m)   Wt 124 lb 6.4 oz (56.4 kg)   LMP 05/24/2006   BMI 21.19 kg/m   Well developed, well-appearing female in no distress  Left foot/ankle 2+ pulses FROM.  Only slight discomfort with dorsiflexion against resistance. No pain with eversion or inversion against resistance, plantarflexion (just slight at  Gastrocs/achilles). No erythema, swelling, effusion or other abnormality noted.  ASSESSMENT/PLAN:  Left foot pain - suspect tendonitis was main issue, but ongoing problems with activity; refer to podiatry for orthotic, ?injection if worsening. risks/SE of NSAIDs reviewed - Plan: Ambulatory referral to Podiatry   Increase naproxen to 2 Aleve twice daily--take with food.  Cut back to just 1 at a time if it bothers your stomach in any way. Take this higher dose only for up to 2 weeks.  We are referring you to [facility] for further evaluation. You might benefit from orthotics. I don't see any significant tendonitis on today's exam, although it sounds like that may have been part of the problem initially.  If you have worsening pain, follow up with the podiatrist--they may be able to offer other kinds of treatments (ie steroid injections). Based on today's exam, there doesn't seem to be significant inflammation or evidence of tendonitis.

## 2016-02-23 ENCOUNTER — Ambulatory Visit (INDEPENDENT_AMBULATORY_CARE_PROVIDER_SITE_OTHER): Payer: Commercial Managed Care - HMO | Admitting: Podiatry

## 2016-02-23 ENCOUNTER — Ambulatory Visit (INDEPENDENT_AMBULATORY_CARE_PROVIDER_SITE_OTHER): Payer: Commercial Managed Care - HMO

## 2016-02-23 ENCOUNTER — Other Ambulatory Visit (INDEPENDENT_AMBULATORY_CARE_PROVIDER_SITE_OTHER): Payer: Commercial Managed Care - HMO

## 2016-02-23 VITALS — BP 117/72 | HR 58

## 2016-02-23 DIAGNOSIS — M659 Synovitis and tenosynovitis, unspecified: Secondary | ICD-10-CM | POA: Diagnosis not present

## 2016-02-23 DIAGNOSIS — M7752 Other enthesopathy of left foot: Secondary | ICD-10-CM

## 2016-02-23 DIAGNOSIS — M25572 Pain in left ankle and joints of left foot: Secondary | ICD-10-CM

## 2016-02-23 DIAGNOSIS — R52 Pain, unspecified: Secondary | ICD-10-CM | POA: Diagnosis not present

## 2016-02-23 DIAGNOSIS — Z23 Encounter for immunization: Secondary | ICD-10-CM

## 2016-02-23 MED ORDER — NONFORMULARY OR COMPOUNDED ITEM: 1.0000 g | Freq: Four times a day (QID) | 2 refills | Status: DC

## 2016-02-23 NOTE — Progress Notes (Signed)
Patient ID: Mary Leach, female   DOB: [birth year], 65 y.o.   MRN: [id number] Subjective:  Patient presents today for treatment and evaluation of left foot and ankle pain. Patient states that back in June 2017 during an aerobics class she injured her left ankle. Patient states that ever since the pain has been intermittent for the past several months.    Objective/Physical Exam General: The patient is alert and oriented x3 in no acute distress.  Dermatology: Skin is warm, dry and supple bilateral lower extremities. Negative for open lesions or macerations.  Vascular: Palpable pedal pulses bilaterally. No edema or erythema noted. Capillary refill within normal limits.  Neurological: Epicritic and protective threshold grossly intact bilaterally.   Musculoskeletal Exam: Minimal pain on palpation to all aspects of the left foot and ankle and leg.  Range of motion within normal limits to all pedal and ankle joints bilateral. Muscle strength 5/5 in all groups bilateral.   Radiographic Exam:  Normal osseous mineralization. Joint spaces preserved. No fracture/dislocation/boney destruction.    Assessment: #1 Achilles tendinitis left #2 diffuse ankle pain left #3 diffuse foot pain left   Plan of Care:  #1 Patient was evaluated. #2 recommend ankle brace during high-impact aerobic activity and a compressive ankle sleeve during everyday activity and walking. Ankle brace and compression sleeve were dispensed today #3 prescription for anti-inflammatory pain cream dispensed through [facility] #4 patient is to return to clinic on a when necessary basis   Dr. Edrick Kins, [place]

## 2016-02-23 NOTE — Progress Notes (Signed)
   Subjective:    Patient ID: Mary Leach, female    DOB: 1951-03-02, 65 y.o.   MRN: [id number]  HPI    Review of Systems  Allergic/Immunologic: Positive for environmental allergies and food allergies.  All other systems reviewed and are negative.      Objective:   Physical Exam        Assessment & Plan:

## 2016-03-01 ENCOUNTER — Telehealth: Payer: Self-pay | Admitting: Family Medicine

## 2016-03-01 MED ORDER — FLUOXETINE HCL 20 MG PO TABS: 20.0000 mg | ORAL_TABLET | Freq: Every day | ORAL | 3 refills | Status: DC

## 2016-03-01 NOTE — Telephone Encounter (Signed)
Let her know that I called it in for 3 pills per day

## 2016-03-01 NOTE — Telephone Encounter (Signed)
Pt called and is stating that she needs a rewritten rx on her prozac to state that she needs it to say take 3 a day instead of 2 a day, states she has talked to you about this before, she has been taking 3 a day, for a while, states she tried to come off of it for a while back but it did not work so she had to go back on it. pt uses [facility], [place] Pt would like a call at [phone],

## 2016-03-02 NOTE — Telephone Encounter (Signed)
Pt informed and verbalized understanding

## 2016-03-09 ENCOUNTER — Telehealth: Payer: Self-pay | Admitting: Family Medicine

## 2016-03-09 MED ORDER — FLUOXETINE HCL 10 MG PO CAPS: ORAL_CAPSULE | ORAL | 3 refills | Status: DC

## 2016-03-09 NOTE — Telephone Encounter (Signed)
In the past she had been on 30 mg of Prozac which has worked very well to keep her mood stabilized. She tried to drop down 20 mg however he this has not worked. She has been on this medication since the mid 90s with good results. I will increase her back up to 30 mg.

## 2016-03-09 NOTE — Telephone Encounter (Signed)
Pt called requesting to get Prozac 71m again because the 219mis not working. She is not able to sleep well on the 2048m

## 2016-05-24 DIAGNOSIS — M810 Age-related osteoporosis without current pathological fracture: Secondary | ICD-10-CM

## 2016-05-24 HISTORY — DX: Age-related osteoporosis without current pathological fracture: M81.0

## 2016-07-05 ENCOUNTER — Encounter: Payer: Self-pay | Admitting: Family Medicine

## 2016-07-05 ENCOUNTER — Ambulatory Visit (INDEPENDENT_AMBULATORY_CARE_PROVIDER_SITE_OTHER): Payer: Commercial Managed Care - HMO | Admitting: Family Medicine

## 2016-07-05 VITALS — BP 118/70 | HR 61 | Temp 97.8°F | Wt 127.8 lb

## 2016-07-05 DIAGNOSIS — J0101 Acute recurrent maxillary sinusitis: Secondary | ICD-10-CM

## 2016-07-05 DIAGNOSIS — J4530 Mild persistent asthma, uncomplicated: Secondary | ICD-10-CM | POA: Diagnosis not present

## 2016-07-05 DIAGNOSIS — J301 Allergic rhinitis due to pollen: Secondary | ICD-10-CM | POA: Diagnosis not present

## 2016-07-05 MED ORDER — LEVOFLOXACIN 500 MG PO TABS: 500.0000 mg | ORAL_TABLET | Freq: Every day | ORAL | 0 refills | Status: DC

## 2016-07-05 NOTE — Progress Notes (Signed)
   Subjective:    Patient ID: Mary Leach, female    DOB: 1951-02-11, 66 y.o.   MRN: [id number]  HPI Approximately one week ago she noted the onset of fatigue, slight sore throat, postnasal drainage, sinus headache followed by fever, chills, fatigue and malaise. The rhinorrhea and hoarse voice started shortly after that. She has a previous history of allergies as well as asthma. She had a sinus surgery procedure done in June and since then has actually done quite well. She continues on her allergy and asthma meds and she does not smoke.   Review of Systems     Objective:   Physical Exam Alert and in no distress. Tympanic membranes and canals are normal. Nasal mucosa is red. Tender to palpation over frontal and maxillary sinuses. Pharyngeal area is normal. Neck is supple without adenopathy or thyromegaly. Cardiac exam shows a regular sinus rhythm without murmurs or gallops. Lungs are clear to auscultation.        Assessment & Plan:  Acute recurrent maxillary sinusitis - Plan: levofloxacin (LEVAQUIN) 500 MG tablet  Chronic allergic rhinitis due to pollen, unspecified seasonality  Mild persistent asthma without complication She will continue on her present medication regimen. He is to call if not entirely better when she finishes the antibiotic.

## 2016-07-12 ENCOUNTER — Other Ambulatory Visit: Payer: Self-pay

## 2016-07-12 ENCOUNTER — Telehealth: Payer: Self-pay | Admitting: Family Medicine

## 2016-07-12 DIAGNOSIS — J0101 Acute recurrent maxillary sinusitis: Secondary | ICD-10-CM

## 2016-07-12 MED ORDER — LEVOFLOXACIN 500 MG PO TABS: 500.0000 mg | ORAL_TABLET | Freq: Every day | ORAL | 0 refills | Status: DC

## 2016-07-12 NOTE — Telephone Encounter (Signed)
ALERT PT HAS DIFFERENT PHARMACY. Pt is out of town caring for a sick daughter and is requesting another round of medication. Pt is requesting Levofloxacin be sent in for her to [facility]. This will be one time for this pharmacy. Please send to [facility] In [place], [place].

## 2016-07-12 NOTE — Telephone Encounter (Signed)
ok 

## 2016-07-12 NOTE — Telephone Encounter (Signed)
Sent in per [employer]

## 2016-08-03 ENCOUNTER — Ambulatory Visit (INDEPENDENT_AMBULATORY_CARE_PROVIDER_SITE_OTHER): Payer: 59 | Admitting: Psychiatry

## 2016-08-03 DIAGNOSIS — F411 Generalized anxiety disorder: Secondary | ICD-10-CM

## 2016-08-03 DIAGNOSIS — F900 Attention-deficit hyperactivity disorder, predominantly inattentive type: Secondary | ICD-10-CM | POA: Diagnosis not present

## 2016-08-11 ENCOUNTER — Other Ambulatory Visit: Payer: Self-pay | Admitting: Family Medicine

## 2016-08-11 DIAGNOSIS — J301 Allergic rhinitis due to pollen: Secondary | ICD-10-CM

## 2016-08-26 ENCOUNTER — Ambulatory Visit (INDEPENDENT_AMBULATORY_CARE_PROVIDER_SITE_OTHER): Payer: 59 | Admitting: Psychiatry

## 2016-08-26 DIAGNOSIS — F900 Attention-deficit hyperactivity disorder, predominantly inattentive type: Secondary | ICD-10-CM | POA: Diagnosis not present

## 2016-08-26 DIAGNOSIS — F411 Generalized anxiety disorder: Secondary | ICD-10-CM

## 2016-09-02 ENCOUNTER — Ambulatory Visit (INDEPENDENT_AMBULATORY_CARE_PROVIDER_SITE_OTHER): Payer: 59 | Admitting: Psychiatry

## 2016-09-02 DIAGNOSIS — F411 Generalized anxiety disorder: Secondary | ICD-10-CM

## 2016-09-02 DIAGNOSIS — F900 Attention-deficit hyperactivity disorder, predominantly inattentive type: Secondary | ICD-10-CM | POA: Diagnosis not present

## 2016-09-06 ENCOUNTER — Other Ambulatory Visit: Payer: Self-pay | Admitting: Obstetrics and Gynecology

## 2016-09-06 DIAGNOSIS — Z1231 Encounter for screening mammogram for malignant neoplasm of breast: Secondary | ICD-10-CM

## 2016-09-09 ENCOUNTER — Ambulatory Visit (INDEPENDENT_AMBULATORY_CARE_PROVIDER_SITE_OTHER): Payer: 59 | Admitting: Psychiatry

## 2016-09-09 DIAGNOSIS — F900 Attention-deficit hyperactivity disorder, predominantly inattentive type: Secondary | ICD-10-CM | POA: Diagnosis not present

## 2016-09-09 DIAGNOSIS — F411 Generalized anxiety disorder: Secondary | ICD-10-CM

## 2016-09-14 ENCOUNTER — Ambulatory Visit: Payer: Commercial Managed Care - HMO | Admitting: Family Medicine

## 2016-09-21 ENCOUNTER — Ambulatory Visit (INDEPENDENT_AMBULATORY_CARE_PROVIDER_SITE_OTHER): Payer: Commercial Managed Care - HMO | Admitting: Family Medicine

## 2016-09-21 ENCOUNTER — Encounter: Payer: Self-pay | Admitting: Family Medicine

## 2016-09-21 VITALS — BP 98/66 | HR 60 | Temp 97.9°F | Ht 64.5 in | Wt 126.6 lb

## 2016-09-21 DIAGNOSIS — J309 Allergic rhinitis, unspecified: Secondary | ICD-10-CM | POA: Diagnosis not present

## 2016-09-21 DIAGNOSIS — H1013 Acute atopic conjunctivitis, bilateral: Secondary | ICD-10-CM | POA: Diagnosis not present

## 2016-09-21 DIAGNOSIS — H04123 Dry eye syndrome of bilateral lacrimal glands: Secondary | ICD-10-CM

## 2016-09-21 MED ORDER — OLOPATADINE HCL 0.2 % OP SOLN: OPHTHALMIC | 0 refills | Status: DC

## 2016-09-21 NOTE — Progress Notes (Signed)
Mary Leach is a 66 y.o. female is here to [place].   History of Present Illness:   Mary Leach CMA acting as scribe for Dr. Juleen China.  CC: Patient is here to Establish care. Patients only concern today is eye drainage that has been going on since November 2017.   Conjunctivitis   The current episode started more than 2 weeks ago. The onset was gradual. The problem has been unchanged. The problem is mild. Nothing relieves the symptoms. Nothing aggravates the symptoms. Associated symptoms include eye itching, rhinorrhea, cough, eye discharge and eye redness. Pertinent negatives include no orthopnea, no fever, no decreased vision, no double vision, no photophobia, no abdominal pain, no constipation, no diarrhea, no nausea, no vomiting, no congestion, no ear discharge, no ear pain, no headaches, no hearing loss, no mouth sores, no sore throat, no stridor, no swollen glands, no muscle aches, no neck pain, no neck stiffness, no URI, no wheezing, no rash and no eye pain.   Health Maintenance Due  Topic Date Due  . PNA vac Low Risk Adult (1 of 2 - PCV13) 07/25/2016   PMHx, SurgHx, SocialHx, Medications, and Allergies were reviewed in the Visit Navigator and updated as appropriate.   Past Medical History:  Diagnosis Date  . ADD (attention deficit disorder)   . Allergic rhinitis   . Anxiety   . Asthma   . Bipolar disorder ([place])   . Celiac disease   . Depression   . History of colonic polyps   . Ileus following gastrointestinal surgery 10/18/2011  . Malnutrition following gastrointestinal surgery 10/18/2011  . RBBB (right bundle branch block)    Past Surgical History:  Procedure Laterality Date  . COLON SURGERY    . COLONOSCOPY W/ POLYPECTOMY    . COLOSTOMY TAKEDOWN  05/29/2012  . CYSTECTOMY     LEFT FOOT, RIGHT HAND  . FOOT SURGERY    . LAPAROTOMY  10/07/2011  . NASAL SEPTOPLASTY W/ TURBINOPLASTY Bilateral 11/11/2015  . NASAL SINUS SURGERY Bilateral 11/11/2015  . TONSILLECTOMY  AND ADENOIDECTOMY    . TUBAL LIGATION    . UPPER GASTROINTESTINAL ENDOSCOPY  01/19/12   Family History  Problem Relation Age of Onset  . Heart disease Father   . Osteoporosis Father   . Osteoporosis Mother   . Mental illness Paternal Aunt   . Cancer Maternal Grandmother     SKIN  . Hypertension Maternal Grandfather   . Heart disease Paternal Grandfather   . Celiac disease Grandchild    Social History  Substance Use Topics  . Smoking status: Never Smoker  . Smokeless tobacco: Never Used  . Alcohol use 1.8 oz/week    3 Standard drinks or equivalent per week     Comment: 1-3 glasses of wine a week or gluten free beer   Current Medications and Allergies:   .  ADVAIR DISKUS 100-50 MCG/DOSE AEPB, Inhale 1 puff into the lungs 2 (two) times daily. , Disp: , Rfl:  .  B Complex-Biotin-FA (B-COMPLEX PO), Take 100 mg by mouth daily., Disp: , Rfl:  .  Bioflavonoid Products (C COMPLEX PO), Take 500 mg by mouth daily. , Disp: , Rfl:  .  Biotin 10 MG CAPS, Take 1 capsule by mouth daily., Disp: , Rfl:  .  Calcium-Magnesium-Vitamin D (CALCIUM 500 PO), Take 1 tablet by mouth daily., Disp: , Rfl:  .  Cholecalciferol (VITAMIN D) 2000 units tablet, Take 2,000 Units by mouth daily., Disp: , Rfl:  .  DYMISTA 137-50 MCG/ACT  SUSP, USE 2 SPRAYS IN EACH NOSTRIL ONCE A DAY., Disp: 23 g, Rfl: 0 .  EPINEPHrine (EPI-PEN) 0.3 mg/0.3 mL DEVI, Inject 0.3 mg into the muscle once. For previous allergies, maybe gluten, Disp: , Rfl:  .  fexofenadine (ALLEGRA) 180 MG tablet, Take 180 mg by mouth daily., Disp: , Rfl:  .  FLUoxetine (PROZAC) 10 MG capsule, 3 pills daily, Disp: 270 capsule, Rfl: 3 .  multivitamin-lutein (OCUVITE-LUTEIN) CAPS, Take 1 capsule by mouth daily., Disp: , Rfl:  .  Omega-3 Fatty Acids (SALMON OIL PO), Take 1,300 mg by mouth daily., Disp: , Rfl:  .  Probiotic Product (PROBIOTIC DAILY PO), Take by mouth. metaflora IB, Disp: , Rfl:  .  Progesterone Micronized (PROGESTERONE PO), Take by mouth.,  Disp: , Rfl:  .  selenium 50 MCG TABS tablet, Take 50 mcg by mouth daily., Disp: , Rfl:  .  UNABLE TO FIND, daily. Med Name: I-Throid 6.26m 1 daily m-w-f, Disp: , Rfl:  .  VITAMIN K PO, Take 150 mcg by mouth daily., Disp: , Rfl:  .  VIVELLE-DOT 0.05 MG/24HR patch, , Disp: , Rfl:  .  Zinc 22.5 MG TABS, Take 22.5 mg by mouth daily., Disp: , Rfl:   Allergies  Allergen Reactions  . Augmentin [Amoxicillin-Pot Clavulanate] Diarrhea  . Other     Multiple food intolerances. Eggs, corn, potatoes, yeast, millett, buckwheat, sesame oil, soy.  Also observes a dairy and wheat free diet.  . Sulfonamide Derivatives Rash    Arms only.  . Tape Rash   Review of Systems:   Review of Systems  Constitutional: Negative for chills, fever and malaise/fatigue.  HENT: Positive for rhinorrhea. Negative for congestion, ear discharge, ear pain, hearing loss, mouth sores, sinus pain and sore throat.   Eyes: Positive for discharge, redness and itching. Negative for blurred vision, double vision, photophobia and pain.  Respiratory: Positive for cough. Negative for shortness of breath, wheezing and stridor.   Cardiovascular: Negative for chest pain, palpitations, orthopnea and leg swelling.  Gastrointestinal: Negative for abdominal pain, constipation, diarrhea, nausea and vomiting.  Genitourinary: Negative for dysuria.  Musculoskeletal: Negative for back pain, joint pain and neck pain.  Skin: Negative for itching and rash.  Neurological: Negative for dizziness and headaches.  Psychiatric/Behavioral: Negative for depression, hallucinations and memory loss.    Vitals:   Vitals:   09/21/16 1537  BP: 98/66  Pulse: 60  Temp: 97.9 F (36.6 C)  TempSrc: Oral  SpO2: 99%  Weight: 126 lb 9.6 oz (57.4 kg)  Height: 5' 4.5" (1.638 m)     Body mass index is 21.4 kg/m.  Physical Exam:   Physical Exam  Constitutional: She appears well-nourished.  HENT:  Head: Normocephalic and atraumatic.  Eyes: EOM are  normal. Pupils are equal, round, and reactive to light. Right conjunctiva is injected. Left conjunctiva is injected.  Neck: Normal range of motion. Neck supple.  Cardiovascular: Normal rate, regular rhythm, normal heart sounds and intact distal pulses.   Pulmonary/Chest: Effort normal.  Abdominal: Soft.  Neurological: She is alert.  Skin: Skin is warm.  Psychiatric: She has a normal mood and affect. Her behavior is normal.  Nursing note and vitals reviewed.  Assessment and Plan:   ASirenwas seen today for establish care and eye drainage.  Diagnoses and all orders for this visit:  Allergic conjunctivitis and rhinitis, bilateral Comments: Minor. Safety net Rx provided. Orders: -     Olopatadine HCl 0.2 % SOLN; Instill one drop into each eye  daily.  Dry eye syndrome of both eyes Comments: Reviewed preservative free saline drops for day and saline gel for night. Follow up with EYE if not improving.    . Reviewed expectations re: course of current medical issues. . Discussed self-management of symptoms. . Outlined signs and symptoms indicating need for more acute intervention. . Patient verbalized understanding and all questions were answered. . See orders for this visit as documented in the electronic medical record. . Patient received an After Visit Summary.  Records requested if needed. I spent 30 minutes with this patient, greater than 50% was face-to-face time counseling regarding the above diagnoses.  CMA served as Education administrator during this visit. History, Physical, and Plan performed by medical provider. Documentation and orders reviewed and attested to. Briscoe Deutscher, D.O.  Briscoe Deutscher, [place], [place] 09/21/2016  Future Appointments Date Time Provider [place]  09/24/2016 4:20 PM [facility] MM 2 [facility]  12/31/2016 12:45 PM Brook E Yisroel Ramming, MD [place] None

## 2016-09-24 ENCOUNTER — Ambulatory Visit
Admission: RE | Admit: 2016-09-24 | Discharge: 2016-09-24 | Disposition: A | Discharge status: Home / Self Care | Payer: Commercial Managed Care - HMO | Source: Ambulatory Visit | Attending: Obstetrics and Gynecology | Admitting: Obstetrics and Gynecology

## 2016-09-24 DIAGNOSIS — Z1231 Encounter for screening mammogram for malignant neoplasm of breast: Secondary | ICD-10-CM

## 2016-09-28 ENCOUNTER — Ambulatory Visit (INDEPENDENT_AMBULATORY_CARE_PROVIDER_SITE_OTHER): Payer: Commercial Managed Care - HMO | Admitting: Family Medicine

## 2016-09-28 ENCOUNTER — Ambulatory Visit (INDEPENDENT_AMBULATORY_CARE_PROVIDER_SITE_OTHER): Payer: 59 | Admitting: Psychiatry

## 2016-09-28 ENCOUNTER — Encounter: Payer: Self-pay | Admitting: Family Medicine

## 2016-09-28 VITALS — BP 118/64 | HR 60 | Temp 97.7°F | Ht 64.5 in | Wt 126.6 lb

## 2016-09-28 DIAGNOSIS — F900 Attention-deficit hyperactivity disorder, predominantly inattentive type: Secondary | ICD-10-CM

## 2016-09-28 DIAGNOSIS — F411 Generalized anxiety disorder: Secondary | ICD-10-CM

## 2016-09-28 DIAGNOSIS — R238 Other skin changes: Secondary | ICD-10-CM | POA: Diagnosis not present

## 2016-09-28 MED ORDER — CLOBETASOL PROPIONATE 0.05 % EX FOAM: Freq: Two times a day (BID) | CUTANEOUS | 0 refills | Status: DC

## 2016-09-28 NOTE — Progress Notes (Signed)
Mary Leach is a 66 y.o. female here for an acute visit.  History of Present Illness:   Water quality scientist, CMA, acting as scribe for Dr. Juleen China.  HPI   Patient states that her scalp has been itching for approximately 1 week.  She reports that her hairdresser told her she has red, scaly patches on her scalp.  Patient denies any changes in hair products.  States she has celiac disease and has several food insensitivities.  She hasn't tried anything for the itching.  PMHx, SurgHx, SocialHx, Medications, and Allergies were reviewed in the Visit Navigator and updated as appropriate.  Current Medications:   Current Outpatient Prescriptions:  .  ADVAIR DISKUS 100-50 MCG/DOSE AEPB, Inhale 1 puff into the lungs 2 (two) times daily. , Disp: , Rfl:  .  B Complex-Biotin-FA (B-COMPLEX PO), Take 100 mg by mouth daily., Disp: , Rfl:  .  Bioflavonoid Products (C COMPLEX PO), Take 500 mg by mouth daily. , Disp: , Rfl:  .  Biotin 10 MG CAPS, Take 1 capsule by mouth daily., Disp: , Rfl:  .  Calcium-Magnesium-Vitamin D (CALCIUM 500 PO), Take 1 tablet by mouth daily., Disp: , Rfl:  .  Cholecalciferol (VITAMIN D) 2000 units tablet, Take 2,000 Units by mouth daily., Disp: , Rfl:  .  EPINEPHrine (EPI-PEN) 0.3 mg/0.3 mL DEVI, Inject 0.3 mg into the muscle once. For previous allergies, maybe gluten, Disp: , Rfl:  .  fexofenadine (ALLEGRA) 180 MG tablet, Take 180 mg by mouth daily., Disp: , Rfl:  .  FLUoxetine (PROZAC) 10 MG capsule, 3 pills daily, Disp: 270 capsule, Rfl: 3 .  fluticasone (FLONASE) 50 MCG/ACT nasal spray, Place 1 spray into both nostrils daily., Disp: , Rfl:  .  multivitamin-lutein (OCUVITE-LUTEIN) CAPS, Take 1 capsule by mouth daily., Disp: , Rfl:  .  NON FORMULARY, , Disp: , Rfl:  .  Olopatadine HCl 0.2 % SOLN, Instill one drop into each eye daily., Disp: 2.5 mL, Rfl: 0 .  Omega-3 Fatty Acids (SALMON OIL PO), Take 1,300 mg by mouth daily., Disp: , Rfl:  .  Probiotic Product (PROBIOTIC DAILY PO),  Take by mouth. metaflora IB, Disp: , Rfl:  .  Progesterone Micronized (PROGESTERONE PO), Take by mouth., Disp: , Rfl:  .  selenium 50 MCG TABS tablet, Take 50 mcg by mouth daily., Disp: , Rfl:  .  UNABLE TO FIND, daily. Med Name: I-Throid 6.29m 1 daily m-w-f, Disp: , Rfl:  .  VITAMIN K PO, Take 150 mcg by mouth daily., Disp: , Rfl:  .  VIVELLE-DOT 0.05 MG/24HR patch, , Disp: , Rfl:  .  Zinc 22.5 MG TABS, Take 22.5 mg by mouth daily., Disp: , Rfl:   Review of Systems:   Review of Systems  Constitutional: Negative for chills and fever.  HENT: Negative for congestion, ear pain and sore throat.   Eyes: Negative for pain.  Respiratory: Negative for cough and shortness of breath.   Cardiovascular: Negative for chest pain and palpitations.  Gastrointestinal: Negative for abdominal pain, nausea and vomiting.  Genitourinary: Negative for frequency.  Musculoskeletal: Negative for back pain and neck pain.  Skin: Positive for itching and rash.       Head.  Neurological: Negative for dizziness, loss of consciousness and headaches.  Psychiatric/Behavioral: Negative for depression. The patient is not nervous/anxious.    Vitals:   Vitals:   09/28/16 1311  BP: 118/64  Pulse: 60  Temp: 97.7 F (36.5 C)  TempSrc: Oral  SpO2: 98%  Weight: 126 lb 9.6 oz (57.4 kg)  Height: 5' 4.5" (1.638 m)     Body mass index is 21.4 kg/m.  Physical Exam:   Physical Exam  Constitutional: She appears well-nourished.  HENT:  Head: Normocephalic and atraumatic.  Eyes: EOM are normal. Pupils are equal, round, and reactive to light.  Neck: Normal range of motion. Neck supple.  Cardiovascular: Normal rate, regular rhythm, normal heart sounds and intact distal pulses.   Pulmonary/Chest: Effort normal.  Abdominal: Soft.  Skin: Skin is warm.  Dry patches on scalp without hair loss.  Psychiatric: She has a normal mood and affect. Her behavior is normal.  Nursing note and vitals reviewed.   Assessment and  Plan:   Telesa was seen today for acute visit and hair/scalp problem.  Diagnoses and all orders for this visit:  Dry scalp -     clobetasol (OLUX) 0.05 % topical foam; Apply topically 2 (two) times daily. - Head and Shoulders daily.  . Reviewed expectations re: course of current medical issues. . Discussed self-management of symptoms. . Outlined signs and symptoms indicating need for more acute intervention. . Patient verbalized understanding and all questions were answered. . See orders for this visit as documented in the electronic medical record. . Patient received an After-Visit Summary.  CMA served as Education administrator during this visit. History, Physical, and Plan performed by medical provider. Documentation and orders reviewed and attested to. Briscoe Deutscher, D.O.  Briscoe Deutscher, D.O. [place], [facility] 09/28/2016   Future Appointments Date Time Provider [place]  12/31/2016 12:45 PM Nunzio Cobbs, MD [place] None

## 2016-10-14 ENCOUNTER — Ambulatory Visit (INDEPENDENT_AMBULATORY_CARE_PROVIDER_SITE_OTHER): Payer: 59 | Admitting: Psychiatry

## 2016-10-14 DIAGNOSIS — F411 Generalized anxiety disorder: Secondary | ICD-10-CM | POA: Diagnosis not present

## 2016-10-14 DIAGNOSIS — F900 Attention-deficit hyperactivity disorder, predominantly inattentive type: Secondary | ICD-10-CM | POA: Diagnosis not present

## 2016-10-21 ENCOUNTER — Ambulatory Visit (INDEPENDENT_AMBULATORY_CARE_PROVIDER_SITE_OTHER): Payer: 59 | Admitting: Psychiatry

## 2016-10-21 DIAGNOSIS — F900 Attention-deficit hyperactivity disorder, predominantly inattentive type: Secondary | ICD-10-CM | POA: Diagnosis not present

## 2016-10-21 DIAGNOSIS — F411 Generalized anxiety disorder: Secondary | ICD-10-CM | POA: Diagnosis not present

## 2016-10-28 ENCOUNTER — Ambulatory Visit (INDEPENDENT_AMBULATORY_CARE_PROVIDER_SITE_OTHER): Payer: 59 | Admitting: Psychiatry

## 2016-10-28 DIAGNOSIS — F900 Attention-deficit hyperactivity disorder, predominantly inattentive type: Secondary | ICD-10-CM | POA: Diagnosis not present

## 2016-10-28 DIAGNOSIS — F411 Generalized anxiety disorder: Secondary | ICD-10-CM | POA: Diagnosis not present

## 2016-11-04 ENCOUNTER — Ambulatory Visit (INDEPENDENT_AMBULATORY_CARE_PROVIDER_SITE_OTHER): Payer: 59 | Admitting: Psychiatry

## 2016-11-04 DIAGNOSIS — F900 Attention-deficit hyperactivity disorder, predominantly inattentive type: Secondary | ICD-10-CM

## 2016-11-04 DIAGNOSIS — F411 Generalized anxiety disorder: Secondary | ICD-10-CM

## 2016-11-18 ENCOUNTER — Ambulatory Visit (INDEPENDENT_AMBULATORY_CARE_PROVIDER_SITE_OTHER): Payer: 59 | Admitting: Psychiatry

## 2016-11-18 DIAGNOSIS — F900 Attention-deficit hyperactivity disorder, predominantly inattentive type: Secondary | ICD-10-CM

## 2016-11-18 DIAGNOSIS — F411 Generalized anxiety disorder: Secondary | ICD-10-CM | POA: Diagnosis not present

## 2016-11-25 ENCOUNTER — Ambulatory Visit (INDEPENDENT_AMBULATORY_CARE_PROVIDER_SITE_OTHER): Payer: 59 | Admitting: Psychiatry

## 2016-11-25 DIAGNOSIS — F411 Generalized anxiety disorder: Secondary | ICD-10-CM

## 2016-11-25 DIAGNOSIS — F909 Attention-deficit hyperactivity disorder, unspecified type: Secondary | ICD-10-CM | POA: Diagnosis not present

## 2016-12-02 ENCOUNTER — Ambulatory Visit (INDEPENDENT_AMBULATORY_CARE_PROVIDER_SITE_OTHER): Payer: 59 | Admitting: Psychiatry

## 2016-12-02 DIAGNOSIS — F900 Attention-deficit hyperactivity disorder, predominantly inattentive type: Secondary | ICD-10-CM | POA: Diagnosis not present

## 2016-12-02 DIAGNOSIS — F411 Generalized anxiety disorder: Secondary | ICD-10-CM

## 2016-12-07 ENCOUNTER — Ambulatory Visit (INDEPENDENT_AMBULATORY_CARE_PROVIDER_SITE_OTHER): Payer: 59 | Admitting: Psychiatry

## 2016-12-07 DIAGNOSIS — F900 Attention-deficit hyperactivity disorder, predominantly inattentive type: Secondary | ICD-10-CM

## 2016-12-07 DIAGNOSIS — F411 Generalized anxiety disorder: Secondary | ICD-10-CM

## 2016-12-23 ENCOUNTER — Ambulatory Visit (INDEPENDENT_AMBULATORY_CARE_PROVIDER_SITE_OTHER): Payer: 59 | Admitting: Psychiatry

## 2016-12-23 DIAGNOSIS — F411 Generalized anxiety disorder: Secondary | ICD-10-CM | POA: Diagnosis not present

## 2016-12-23 DIAGNOSIS — F900 Attention-deficit hyperactivity disorder, predominantly inattentive type: Secondary | ICD-10-CM | POA: Diagnosis not present

## 2016-12-30 ENCOUNTER — Ambulatory Visit (INDEPENDENT_AMBULATORY_CARE_PROVIDER_SITE_OTHER): Payer: 59 | Admitting: Psychiatry

## 2016-12-30 DIAGNOSIS — F900 Attention-deficit hyperactivity disorder, predominantly inattentive type: Secondary | ICD-10-CM

## 2016-12-30 DIAGNOSIS — F411 Generalized anxiety disorder: Secondary | ICD-10-CM

## 2016-12-30 NOTE — Progress Notes (Signed)
66 y.o. G2P2 Married Caucasian female here for annual exam.    On Vivelle dot, Prometrium, and Testosterone through [employer]. No vaginal bleeding.  No vaginal dryness issues.   Gained a little weight.  Feeling better than she has in the last 5 years. Sleeping better.  Good bladder and bowel function.   PCP:  Briscoe Deutscher, DO   Patient's last menstrual period was 05/24/2006.           Sexually active: Yes.   female The current method of family planning is tubal ligation.    Exercising: Yes.    Tubal/postmenopausl Smoker:  no  Health Maintenance: Pap: 12-26-14 Neg:Neg HR HPV History of abnormal Pap:  no MMG:  09-24-16 Density B/Neg/BiRads1:TBC Colonoscopy: 12/08/15 - 1 polyp.  Dr. Collene Mares.  Due again in 2022. BMD:  06-19-14  Result :Osteopenia TDaP:  06-26-09 Gardasil:   no HIV:04-06-06 Neg Hep C:04-06-06 Neg Screening Labs:   Labs with Franz Dell.   reports that she has never smoked. She has never used smokeless tobacco. She reports that she drinks about 1.8 oz of alcohol per week . She reports that she does not use drugs.  Past Medical History:  Diagnosis Date  . ADD (attention deficit disorder)   . Allergic rhinitis   . Anxiety   . Asthma   . Bipolar disorder ([place])   . Celiac disease   . Depression   . History of colonic polyps   . Ileus following gastrointestinal surgery 10/18/2011  . Malnutrition following gastrointestinal surgery 10/18/2011  . RBBB (right bundle branch block)     Past Surgical History:  Procedure Laterality Date  . COLON SURGERY    . COLONOSCOPY W/ POLYPECTOMY    . COLOSTOMY TAKEDOWN  05/29/2012   Procedure: LAPAROSCOPIC COLOSTOMY TAKEDOWN;  Surgeon: Odis Hollingshead, MD;  Location: [facility];  Service: General;  Laterality: N/A;  Laparoscopic Assisted Colostomy Closure  with proctoscopy  . CYSTECTOMY     LEFT FOOT, RIGHT HAND  . FOOT SURGERY    . LAPAROTOMY  10/07/2011   Procedure: EXPLORATORY LAPAROTOMY;  Surgeon: Odis Hollingshead, MD;  Location:  [facility];  Service: General;  Laterality: N/A;  hartmann procedure and creation of colostomy  . NASAL SEPTOPLASTY W/ TURBINOPLASTY Bilateral 11/11/2015   Procedure: SEPTOPLASTY BILATERAL TURBINATE  REDUCTION ;  Surgeon: Leta Baptist, MD;  Location: [facility];  Service: ENT;  Laterality: Bilateral;  . NASAL SINUS SURGERY Bilateral 11/11/2015   Procedure: BILATERAL ENDOSCOPIC  MAXILLARY ANTROSTOMY;  Surgeon: Leta Baptist, MD;  Location: [facility];  Service: ENT;  Laterality: Bilateral;  . TONSILLECTOMY AND ADENOIDECTOMY    . TUBAL LIGATION    . UPPER GASTROINTESTINAL ENDOSCOPY  01/19/12    Current Outpatient Prescriptions  Medication Sig Dispense Refill  . ADVAIR DISKUS 100-50 MCG/DOSE AEPB Inhale 1 puff into the lungs 2 (two) times daily.     . B Complex-Biotin-FA (B-COMPLEX PO) Take 100 mg by mouth daily.    Marland Kitchen Bioflavonoid Products (C COMPLEX PO) Take 500 mg by mouth daily.     . Biotin 10 MG CAPS Take 1 capsule by mouth daily.    . Calcium-Magnesium-Vitamin D (CALCIUM 500 PO) Take 1 tablet by mouth daily.    . Cholecalciferol (VITAMIN D) 2000 units tablet Take 2,000 Units by mouth daily.    . clobetasol (OLUX) 0.05 % topical foam Apply topically 2 (two) times daily. 50 g 0  . EPINEPHrine (EPI-PEN) 0.3 mg/0.3 mL DEVI Inject 0.3  mg into the muscle once. For previous allergies, maybe gluten    . fexofenadine (ALLEGRA) 180 MG tablet Take 180 mg by mouth daily.    Marland Kitchen FLUoxetine (PROZAC) 10 MG capsule 3 pills daily 270 capsule 3  . fluticasone (FLONASE) 50 MCG/ACT nasal spray Place 1 spray into both nostrils daily.    . multivitamin-lutein (OCUVITE-LUTEIN) CAPS Take 1 capsule by mouth daily.    . NON FORMULARY     . Olopatadine HCl 0.2 % SOLN Instill one drop into each eye daily. 2.5 mL 0  . Omega-3 Fatty Acids (SALMON OIL PO) Take 1,300 mg by mouth daily.    . Probiotic Product (PROBIOTIC DAILY PO) Take by mouth. metaflora IB    . Progesterone Micronized (PROGESTERONE PO)  Take by mouth.    . selenium 50 MCG TABS tablet Take 50 mcg by mouth daily.    Marland Kitchen UNABLE TO FIND daily. Med Name: I-Throid 6.28m 1 daily m-w-f    . VITAMIN K PO Take 150 mcg by mouth daily.    .Marland KitchenVIVELLE-DOT 0.05 MG/24HR patch     . Zinc 22.5 MG TABS Take 22.5 mg by mouth daily.     No current facility-administered medications for this visit.     Family History  Problem Relation Age of Onset  . Heart disease Father   . Osteoporosis Father   . Osteoporosis Mother   . Mental illness Paternal Aunt   . Cancer Maternal Grandmother        SKIN  . Hypertension Maternal Grandfather   . Heart disease Paternal Grandfather   . Celiac disease Grandchild     ROS:  Pertinent items are noted in HPI.  Otherwise, a comprehensive ROS was negative.  Exam:   BP 110/74 (BP Location: Right Arm, Patient Position: Sitting, Cuff Size: Small)   Pulse 66   Resp 14   Ht 5' 3.75" (1.619 m)   Wt 129 lb (58.5 kg)   LMP 05/24/2006   BMI 22.32 kg/m     General appearance: alert, cooperative and appears stated age Head: Normocephalic, without obvious abnormality, atraumatic Neck: no adenopathy, supple, symmetrical, trachea midline and thyroid normal to inspection and palpation Lungs: clear to auscultation bilaterally Breasts: normal appearance, no masses or tenderness, No nipple retraction or dimpling, No nipple discharge or bleeding, No axillary or supraclavicular adenopathy Heart: regular rate and rhythm Abdomen: Vertical midline incision with umbilical hernia.  Abdomen is soft, non-tender; no masses, no organomegaly Extremities: extremities normal, atraumatic, no cyanosis or edema Skin: Skin color, texture, turgor normal. No rashes or lesions Lymph nodes: Cervical, supraclavicular, and axillary nodes normal. No abnormal inguinal nodes palpated Neurologic: Grossly normal  Pelvic: External genitalia:  no lesions              Urethra:  normal appearing urethra with no masses, tenderness or lesions               Bartholins and Skenes: normal                 Vagina: normal appearing vagina with normal color and discharge, no lesions              Cervix: no lesions              Pap taken: No. Bimanual Exam:  Uterus:  normal size, contour, position, consistency, mobility, non-tender              Adnexa: no mass, fullness, tenderness  Rectal exam: Yes.  .  Confirms.              Anus:  normal sphincter tone, no lesions  Chaperone was present for exam.  Assessment:   Well woman visit with normal exam. HRT.  Osteopenia.   Plan: Mammogram screening discussed. Recommended self breast awareness. Pap and HR HPV as above. Guidelines for Calcium, Vitamin D, regular exercise program including cardiovascular and weight bearing exercise. BMD at [facility].  Order placed.  She will call.  HR through [employer].  I discussed risk of cardiovascular disease and breast cancer.  Follow up annually and prn.   After visit summary provided.

## 2016-12-31 ENCOUNTER — Ambulatory Visit (INDEPENDENT_AMBULATORY_CARE_PROVIDER_SITE_OTHER): Payer: 59 | Admitting: Obstetrics and Gynecology

## 2016-12-31 ENCOUNTER — Encounter: Payer: Self-pay | Admitting: Obstetrics and Gynecology

## 2016-12-31 VITALS — BP 110/74 | HR 66 | Resp 14 | Ht 63.75 in | Wt 129.0 lb

## 2016-12-31 DIAGNOSIS — Z01419 Encounter for gynecological examination (general) (routine) without abnormal findings: Secondary | ICD-10-CM | POA: Diagnosis not present

## 2016-12-31 DIAGNOSIS — Z780 Asymptomatic menopausal state: Secondary | ICD-10-CM | POA: Diagnosis not present

## 2016-12-31 NOTE — Patient Instructions (Signed)

## 2017-01-06 ENCOUNTER — Ambulatory Visit (INDEPENDENT_AMBULATORY_CARE_PROVIDER_SITE_OTHER): Payer: 59 | Admitting: Psychiatry

## 2017-01-06 DIAGNOSIS — F411 Generalized anxiety disorder: Secondary | ICD-10-CM

## 2017-01-06 DIAGNOSIS — F900 Attention-deficit hyperactivity disorder, predominantly inattentive type: Secondary | ICD-10-CM | POA: Diagnosis not present

## 2017-01-13 ENCOUNTER — Ambulatory Visit: Payer: Self-pay | Admitting: Psychiatry

## 2017-01-20 ENCOUNTER — Ambulatory Visit (INDEPENDENT_AMBULATORY_CARE_PROVIDER_SITE_OTHER): Payer: 59 | Admitting: Psychiatry

## 2017-01-20 DIAGNOSIS — F411 Generalized anxiety disorder: Secondary | ICD-10-CM

## 2017-01-20 DIAGNOSIS — F900 Attention-deficit hyperactivity disorder, predominantly inattentive type: Secondary | ICD-10-CM | POA: Diagnosis not present

## 2017-01-27 ENCOUNTER — Ambulatory Visit (INDEPENDENT_AMBULATORY_CARE_PROVIDER_SITE_OTHER): Payer: 59 | Admitting: Psychiatry

## 2017-01-27 DIAGNOSIS — F411 Generalized anxiety disorder: Secondary | ICD-10-CM | POA: Diagnosis not present

## 2017-01-27 DIAGNOSIS — F900 Attention-deficit hyperactivity disorder, predominantly inattentive type: Secondary | ICD-10-CM

## 2017-02-02 ENCOUNTER — Other Ambulatory Visit: Payer: Self-pay | Admitting: Obstetrics and Gynecology

## 2017-02-02 DIAGNOSIS — M8580 Other specified disorders of bone density and structure, unspecified site: Secondary | ICD-10-CM

## 2017-02-02 DIAGNOSIS — Z780 Asymptomatic menopausal state: Secondary | ICD-10-CM

## 2017-02-03 ENCOUNTER — Ambulatory Visit: Payer: Self-pay | Admitting: Psychiatry

## 2017-02-10 ENCOUNTER — Ambulatory Visit (INDEPENDENT_AMBULATORY_CARE_PROVIDER_SITE_OTHER): Payer: 59 | Admitting: Psychiatry

## 2017-02-10 DIAGNOSIS — F900 Attention-deficit hyperactivity disorder, predominantly inattentive type: Secondary | ICD-10-CM

## 2017-02-10 DIAGNOSIS — F411 Generalized anxiety disorder: Secondary | ICD-10-CM | POA: Diagnosis not present

## 2017-02-17 ENCOUNTER — Ambulatory Visit (INDEPENDENT_AMBULATORY_CARE_PROVIDER_SITE_OTHER): Payer: 59 | Admitting: Psychiatry

## 2017-02-17 ENCOUNTER — Other Ambulatory Visit: Payer: Self-pay

## 2017-02-17 DIAGNOSIS — F900 Attention-deficit hyperactivity disorder, predominantly inattentive type: Secondary | ICD-10-CM | POA: Diagnosis not present

## 2017-02-17 DIAGNOSIS — F411 Generalized anxiety disorder: Secondary | ICD-10-CM | POA: Diagnosis not present

## 2017-02-21 ENCOUNTER — Encounter: Payer: Self-pay | Admitting: Family Medicine

## 2017-02-24 ENCOUNTER — Ambulatory Visit: Payer: Self-pay | Admitting: Psychiatry

## 2017-02-28 ENCOUNTER — Ambulatory Visit (INDEPENDENT_AMBULATORY_CARE_PROVIDER_SITE_OTHER): Payer: Self-pay | Admitting: *Deleted

## 2017-02-28 DIAGNOSIS — Z23 Encounter for immunization: Secondary | ICD-10-CM

## 2017-03-01 ENCOUNTER — Other Ambulatory Visit: Payer: Self-pay | Admitting: Family Medicine

## 2017-03-01 NOTE — Telephone Encounter (Signed)
Looks like she has established with another practice. Will deny med

## 2017-03-01 NOTE — Telephone Encounter (Signed)
Check dosage with patient. 20 or 10?

## 2017-03-01 NOTE — Telephone Encounter (Signed)
Please advise on refill. Historical provider.  

## 2017-03-02 ENCOUNTER — Telehealth: Payer: Self-pay | Admitting: Family Medicine

## 2017-03-02 NOTE — Telephone Encounter (Signed)
Patient returning missed phone call. Did not see note of who called patient. Please advise.

## 2017-03-02 NOTE — Telephone Encounter (Signed)
Spoke with patient and she takes Prozac 10 mg TID. I have sent RX to pharmacy. Please see RX refill request note for more detail.

## 2017-03-02 NOTE — Telephone Encounter (Signed)
LM for patient to return call to verify dosage of Prozac.

## 2017-03-03 ENCOUNTER — Ambulatory Visit: Payer: Self-pay | Admitting: Psychiatry

## 2017-03-10 ENCOUNTER — Ambulatory Visit (INDEPENDENT_AMBULATORY_CARE_PROVIDER_SITE_OTHER): Payer: Self-pay | Admitting: Psychiatry

## 2017-03-10 ENCOUNTER — Telehealth: Payer: Self-pay | Admitting: *Deleted

## 2017-03-10 ENCOUNTER — Other Ambulatory Visit: Payer: Self-pay

## 2017-03-10 ENCOUNTER — Encounter: Payer: Self-pay | Admitting: *Deleted

## 2017-03-10 DIAGNOSIS — F411 Generalized anxiety disorder: Secondary | ICD-10-CM

## 2017-03-10 DIAGNOSIS — F900 Attention-deficit hyperactivity disorder, predominantly inattentive type: Secondary | ICD-10-CM

## 2017-03-10 NOTE — Telephone Encounter (Signed)
Patient requesting Pneumococcal vaccine set. Due to allergies, she wanted to ensure she was able to receive each vaccine. After doing research, it appears Prevnar 13 contains yeast so she will not be able to receive that vaccine. Pneumovax 23 does not appear to be contraindicated with the known allergies provided in her chart. Spoke with Dr. Juleen China and provided her documents of all research and she agrees on the plan to not give the Prevnar 13 but to give the Pneumovax 23.  Further, patient is aware she ultimately must make the decision, provided our educated opinion and research available to her. Spoke to patient and she is aware of all aspects of care and agrees to receive vaccine. A waiver form was created and will be available for patient to sign next week when she comes in to receive vaccine.   A nurse visit appointment was scheduled.

## 2017-03-10 NOTE — Telephone Encounter (Signed)
error 

## 2017-03-15 ENCOUNTER — Ambulatory Visit (INDEPENDENT_AMBULATORY_CARE_PROVIDER_SITE_OTHER): Payer: 59

## 2017-03-15 ENCOUNTER — Ambulatory Visit
Admission: RE | Admit: 2017-03-15 | Discharge: 2017-03-15 | Disposition: A | Discharge status: Home / Self Care | Payer: 59 | Source: Ambulatory Visit | Attending: Obstetrics and Gynecology | Admitting: Obstetrics and Gynecology

## 2017-03-15 DIAGNOSIS — Z23 Encounter for immunization: Secondary | ICD-10-CM | POA: Diagnosis not present

## 2017-03-15 DIAGNOSIS — M8580 Other specified disorders of bone density and structure, unspecified site: Secondary | ICD-10-CM

## 2017-03-15 DIAGNOSIS — Z780 Asymptomatic menopausal state: Secondary | ICD-10-CM

## 2017-03-16 ENCOUNTER — Encounter: Payer: Self-pay | Admitting: Obstetrics and Gynecology

## 2017-03-17 ENCOUNTER — Ambulatory Visit (INDEPENDENT_AMBULATORY_CARE_PROVIDER_SITE_OTHER): Payer: 59 | Admitting: Family Medicine

## 2017-03-17 ENCOUNTER — Ambulatory Visit: Payer: Self-pay | Admitting: Psychiatry

## 2017-03-17 ENCOUNTER — Encounter: Payer: Self-pay | Admitting: Family Medicine

## 2017-03-17 VITALS — BP 112/72 | HR 70 | Temp 98.4°F | Ht 63.75 in | Wt 127.0 lb

## 2017-03-17 DIAGNOSIS — J0100 Acute maxillary sinusitis, unspecified: Secondary | ICD-10-CM | POA: Diagnosis not present

## 2017-03-17 MED ORDER — LEVOFLOXACIN 500 MG PO TABS: 500.0000 mg | ORAL_TABLET | Freq: Every day | ORAL | 0 refills | Status: DC

## 2017-03-17 MED ORDER — PREDNISONE 5 MG PO TABS: ORAL_TABLET | ORAL | 0 refills | Status: DC

## 2017-03-17 NOTE — Progress Notes (Signed)
Mary Leach is a 66 y.o. female here for an acute visit.  History of Present Illness:   Water quality scientist, CMA, acting as scribe for Dr. Juleen China.  Sinusitis  This is a new problem. The current episode started 1 to 4 weeks ago. The problem has been gradually worsening since onset. There has been no fever. The pain is moderate. Associated symptoms include chills, congestion, headaches, sinus pressure and a sore throat. Past treatments include acetaminophen. The treatment provided no relief.   PMHx, SurgHx, SocialHx, Medications, and Allergies were reviewed in the Visit Navigator and updated as appropriate.  Current Medications:   Current Outpatient Prescriptions:  .  ADVAIR DISKUS 100-50 MCG/DOSE AEPB, Inhale 1 puff into the lungs 2 (two) times daily. , Disp: , Rfl:  .  B Complex-Biotin-FA (B-COMPLEX PO), Take 100 mg by mouth daily., Disp: , Rfl:  .  Bioflavonoid Products (C COMPLEX PO), Take 500 mg by mouth daily. , Disp: , Rfl:  .  Biotin 10 MG CAPS, Take 1 capsule by mouth daily., Disp: , Rfl:  .  Cholecalciferol (VITAMIN D) 2000 units tablet, Take 2,000 Units by mouth daily., Disp: , Rfl:  .  EPINEPHrine (EPI-PEN) 0.3 mg/0.3 mL DEVI, Inject 0.3 mg into the muscle once. For previous allergies, maybe gluten, Disp: , Rfl:  .  FLUoxetine (PROZAC) 10 MG capsule, TAKE 3 CAPSULES DAILY AS DIRECTED., Disp: 93 capsule, Rfl: 1 .  multivitamin-lutein (OCUVITE-LUTEIN) CAPS, Take 1 capsule by mouth daily., Disp: , Rfl:  .  NON FORMULARY, , Disp: , Rfl:  .  Olopatadine HCl 0.2 % SOLN, Instill one drop into each eye daily., Disp: 2.5 mL, Rfl: 0 .  Omega-3 Fatty Acids (SALMON OIL PO), Take 1,300 mg by mouth daily., Disp: , Rfl:  .  Probiotic Product (PROBIOTIC DAILY PO), Take by mouth. metaflora IB, Disp: , Rfl:  .  Progesterone Micronized (PROGESTERONE PO), Take by mouth., Disp: , Rfl:  .  selenium 50 MCG TABS tablet, Take 50 mcg by mouth daily., Disp: , Rfl:  .  thyroid (ARMOUR) 90 MG tablet, Take 90  mg by mouth daily., Disp: , Rfl:  .  UNABLE TO FIND, daily. Med Name: I-Throid 6.59m 1 daily m-w-f, Disp: , Rfl:  .  VITAMIN K PO, Take 150 mcg by mouth daily., Disp: , Rfl:  .  VIVELLE-DOT 0.05 MG/24HR patch, , Disp: , Rfl:  .  Zinc 22.5 MG TABS, Take 22.5 mg by mouth daily., Disp: , Rfl:    Allergies  Allergen Reactions  . Augmentin [Amoxicillin-Pot Clavulanate] Diarrhea  . Other     Multiple food intolerances. Eggs, corn, potatoes, yeast, millett, buckwheat, sesame oil, soy.  Also observes a dairy and wheat free diet.  .Marland KitchenPrevnar [Pneumococcal 13-Val Conj Vacc]     Vaccine contains yeast   . Sulfonamide Derivatives Rash    Arms only.  . Tape Rash   Review of Systems:   Pertinent items are noted in the HPI. Otherwise, ROS is negative.  Vitals:   Vitals:   03/17/17 0903  BP: 112/72  Pulse: 70  Temp: 98.4 F (36.9 C)  TempSrc: Oral  SpO2: 99%  Weight: 127 lb (57.6 kg)  Height: 5' 3.75" (1.619 m)     Body mass index is 21.97 kg/m.   Physical Exam:   Physical Exam  Constitutional: She appears well-nourished.  HENT:  Head: Normocephalic and atraumatic.  Eyes: Pupils are equal, round, and reactive to light. EOM are normal.  Neck: Normal range  of motion. Neck supple.  Cardiovascular: Normal rate, regular rhythm, normal heart sounds and intact distal pulses.   Pulmonary/Chest: Effort normal.  Abdominal: Soft.  Skin: Skin is warm.  Psychiatric: She has a normal mood and affect. Her behavior is normal.  Nursing note and vitals reviewed.   Assessment and Plan:   Mary Leach was seen today for acute visit.  Diagnoses and all orders for this visit:  Acute non-recurrent maxillary sinusitis -     levofloxacin (LEVAQUIN) 500 MG tablet; Take 1 tablet (500 mg total) by mouth daily. -     predniSONE (DELTASONE) 5 MG tablet; 6, 5, 4, 3, 2, 1, off    . Reviewed expectations re: course of current medical issues. . Discussed self-management of symptoms. . Outlined signs and  symptoms indicating need for more acute intervention. . Patient verbalized understanding and all questions were answered. Marland Kitchen Health Maintenance issues including appropriate healthy diet, exercise, and smoking avoidance were discussed with patient. . See orders for this visit as documented in the electronic medical record. . Patient received an After Visit Summary.  CMA served as Education administrator during this visit. History, Physical, and Plan performed by medical provider. The above documentation has been reviewed and is accurate and complete. Briscoe Deutscher, D.O.  Briscoe Deutscher, DO [place] [place] 03/18/2017  Future Appointments Date Time Provider [place]  03/22/2017 9:30 AM Nunzio Cobbs, MD [place] None  03/24/2017 11:00 AM Yaakov Guthrie, PsyD [facility] None  03/31/2017 11:00 AM Yaakov Guthrie, PsyD [facility] None  04/07/2017 11:00 AM Yaakov Guthrie, PsyD [facility] None  01/12/2018 3:45 PM Amundson Raliegh Ip, MD [place] None

## 2017-03-22 ENCOUNTER — Encounter: Payer: Self-pay | Admitting: Obstetrics and Gynecology

## 2017-03-22 ENCOUNTER — Ambulatory Visit (INDEPENDENT_AMBULATORY_CARE_PROVIDER_SITE_OTHER): Payer: 59 | Admitting: Obstetrics and Gynecology

## 2017-03-22 VITALS — BP 98/60 | HR 72 | Resp 16 | Wt 129.0 lb

## 2017-03-22 DIAGNOSIS — M810 Age-related osteoporosis without current pathological fracture: Secondary | ICD-10-CM | POA: Diagnosis not present

## 2017-03-22 NOTE — Progress Notes (Signed)
GYNECOLOGY  VISIT   HPI: 66 y.o.   Married  Caucasian  female   G2P2 with Patient's last menstrual period was 05/24/2006.   here for BMD consult  -- Results in EPIC.  BMD from 03/15/17 showing T score -2.8 of the forearm.  Hip showing osteopenia.  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  Had a fall this summer and did not fracture her arm.  No prior fractures.   She is on HRT through [facility].  Monitoring Vit D through them.   She has celiac disease and asthma.  Some prednisone use for sinusitis. Hx gastrointestinal surgery.  Ultimate dx was celiac. RBBB.  Is dairy free.   PCP - Briscoe Deutscher at [place], [place].  GYNECOLOGIC HISTORY: Patient's last menstrual period was 05/24/2006. Contraception:  Tubal ligation Menopausal hormone therapy:  Vivelle-Dot Last mammogram:  09/24/16 BIRADS 1 negative/density b Last pap smear:   12/26/14 Pap and HR HPV negative BMD: 03/15/17 Osteoporosis       OB History    Gravida Para Term Preterm AB Living   2 2       2    SAB TAB Ectopic Multiple Live Births                     Patient Active Problem List   Diagnosis Date Noted  . Dry scalp 09/28/2016  . Celiac sprue 07/02/2015  . Allergic rhinitis due to pollen 12/18/2014  . H/O resection of large bowel 12/15/2013  . 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 Asthma 10/18/2011  . ADHD (attention deficit hyperactivity disorder) 10/18/2011  . Rectal Perforation due to fecal impaction s/p Hartmann procedure 5/16 10/08/2011    Past Medical History:  Diagnosis Date  . ADD (attention deficit disorder)   . Allergic rhinitis   . Anxiety   . Asthma   . Bipolar disorder ([place])   . Celiac disease   . Depression   . History of colonic polyps   . Ileus following gastrointestinal surgery 10/18/2011  . Malnutrition following gastrointestinal surgery 10/18/2011  . Osteoporosis 2018  . RBBB (right bundle branch block)     Past Surgical History:  Procedure  Laterality Date  . COLON SURGERY    . COLONOSCOPY W/ POLYPECTOMY    . COLOSTOMY TAKEDOWN  05/29/2012   Procedure: LAPAROSCOPIC COLOSTOMY TAKEDOWN;  Surgeon: Odis Hollingshead, MD;  [facility];  Service: General;  Laterality: N/A;  Laparoscopic Assisted Colostomy Closure  with proctoscopy  . CYSTECTOMY     LEFT FOOT, RIGHT HAND  . FOOT SURGERY    . LAPAROTOMY  10/07/2011   Procedure: EXPLORATORY LAPAROTOMY;  Surgeon: Odis Hollingshead, MD;  [facility];  Service: General;  Laterality: N/A;  hartmann procedure and creation of colostomy  . NASAL SEPTOPLASTY W/ TURBINOPLASTY Bilateral 11/11/2015   Procedure: SEPTOPLASTY BILATERAL TURBINATE  REDUCTION ;  Surgeon: Leta Baptist, MD;  Location: [place];  Service: ENT;  Laterality: Bilateral;  . NASAL SINUS SURGERY Bilateral 11/11/2015   Procedure: BILATERAL ENDOSCOPIC  MAXILLARY ANTROSTOMY;  Surgeon: Leta Baptist, MD;  Location: [place];  Service: ENT;  Laterality: Bilateral;  . TONSILLECTOMY AND ADENOIDECTOMY    . TUBAL LIGATION    . UPPER GASTROINTESTINAL ENDOSCOPY  01/19/12    Current Outpatient Prescriptions  Medication Sig Dispense Refill  . ADVAIR DISKUS 100-50 MCG/DOSE AEPB Inhale 1 puff into the lungs 2 (two) times daily.     Marland Kitchen B  Complex-Biotin-FA (B-COMPLEX PO) Take 100 mg by mouth daily.    Marland Kitchen Bioflavonoid Products (C COMPLEX PO) Take 500 mg by mouth daily.     . Biotin 10 MG CAPS Take 1 capsule by mouth daily.    . Cholecalciferol (VITAMIN D) 2000 units tablet Take 2,000 Units by mouth daily.    Marland Kitchen EPINEPHrine (EPI-PEN) 0.3 mg/0.3 mL DEVI Inject 0.3 mg into the muscle once. For previous allergies, maybe gluten    . FLUoxetine (PROZAC) 10 MG capsule TAKE 3 CAPSULES DAILY AS DIRECTED. 93 capsule 1  . multivitamin-lutein (OCUVITE-LUTEIN) CAPS Take 1 capsule by mouth daily.    . NON FORMULARY     . Omega-3 Fatty Acids (SALMON OIL PO) Take 1,300 mg by mouth daily.    . Probiotic Product (PROBIOTIC DAILY PO)  Take by mouth. metaflora IB    . Progesterone Micronized (PROGESTERONE PO) Take by mouth.    . selenium 50 MCG TABS tablet Take 50 mcg by mouth daily.    Marland Kitchen thyroid (ARMOUR) 90 MG tablet Take 90 mg by mouth daily.    Marland Kitchen UNABLE TO FIND daily. Med Name: I-Throid 6.72m 1 daily m-w-f    . VITAMIN K PO Take 150 mcg by mouth daily.    .Marland KitchenVIVELLE-DOT 0.05 MG/24HR patch     . Zinc 22.5 MG TABS Take 22.5 mg by mouth daily.     No current facility-administered medications for this visit.      ALLERGIES: Augmentin [amoxicillin-pot clavulanate]; Other; Prevnar [pneumococcal 13-val conj vacc]; Sulfonamide derivatives; and Tape  Family History  Problem Relation Age of Onset  . Heart disease Father   . Osteoporosis Father   . Osteoporosis Mother   . Mental illness Paternal Aunt   . Cancer Maternal Grandmother        SKIN  . Hypertension Maternal Grandfather   . Heart disease Paternal Grandfather   . Celiac disease Grandchild     Social History   Social History  . Marital status: Married    Spouse name: N/A  . Number of children: N/A  . Years of education: N/A   Occupational History  . Not on file.   Social History Main Topics  . Smoking status: Never Smoker  . Smokeless tobacco: Never Used  . Alcohol use 1.8 oz/week    3 Standard drinks or equivalent per week     Comment: 1-3 glasses of wine a week or gluten free beer  . Drug use: No  . Sexual activity: Yes    Partners: Male    Birth control/ protection: Post-menopausal   Other Topics Concern  . Not on file   Social History Narrative   Married, lives with husband and son JJenny Leach  Leach ([employer]).  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  PHYSICAL EXAMINATION:    BP 98/60 (BP Location: Right Arm, Patient Position: Sitting, Cuff Size: Normal)   Pulse 72   Resp 16   Wt 129 lb (58.5 kg)   LMP 05/24/2006   BMI 22.32 kg/m     General appearance: alert, cooperative and  appears stated age   Chaperone was present for exam.  ASSESSMENT  Osteoporosis of forearm. Celiac disease.  Sinusitis - recurrent per patient.  Hx intestinal surgery with dx of celiac in the end. On HRT. 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  PLAN  Discussion of osteoporosis - risk factors, risk of fracture, and treatment options.  We reviewed in detail tx  choices - bisphosphonates, Evista, Prolia, Reclast.  I highlighted Fosamax as my first choice.  Will check PTH, calcium. BMD in 2 years.  She will discuss tx options with Franz Dell.   An After Visit Summary was printed and given to the patient.  ___25__ minutes face to face time of which over 50% was spent in counseling.

## 2017-03-22 NOTE — Patient Instructions (Signed)
Osteoporosis Osteoporosis is the thinning and loss of density in the bones. Osteoporosis makes the bones more brittle, fragile, and likely to break (fracture). Over time, osteoporosis can cause the bones to become so weak that they fracture after a simple fall. The bones most likely to fracture are the bones in the hip, wrist, and spine. What are the causes? The exact cause is not known. What increases the risk? Anyone can develop osteoporosis. You may be at greater risk if you have a family history of the condition or have poor nutrition. You may also have a higher risk if you are:  Female.  65 years old or older.  A smoker.  Not physically active.  White or Asian.  Slender.  What are the signs or symptoms? A fracture might be the first sign of the disease, especially if it results from a fall or injury that would not usually cause a bone to break. Other signs and symptoms include:  Low back and neck pain.  Stooped posture.  Height loss.  How is this diagnosed? To make a diagnosis, your health care provider may:  Take a medical history.  Perform a physical exam.  Order tests, such as: ? A bone mineral density test. ? A dual-energy X-ray absorptiometry test.  How is this treated? The goal of osteoporosis treatment is to strengthen your bones to reduce your risk of a fracture. Treatment may involve:  Making lifestyle changes, such as: ? Eating a diet rich in calcium. ? Doing weight-bearing and muscle-strengthening exercises. ? Stopping tobacco use. ? Limiting alcohol intake.  Taking medicine to slow the process of bone loss or to increase bone density.  Monitoring your levels of calcium and vitamin D.  Follow these instructions at home:  Include calcium and vitamin D in your diet. Calcium is important for bone health, and vitamin D helps the body absorb calcium.  Perform weight-bearing and muscle-strengthening exercises as directed by your health care  provider.  Do not use any tobacco products, including cigarettes, chewing tobacco, and electronic cigarettes. If you need help quitting, ask your health care provider.  Limit your alcohol intake.  Take medicines only as directed by your health care provider.  Keep all follow-up visits as directed by your health care provider. This is important.  Take precautions at home to lower your risk of falling, such as: ? Keeping rooms well lit and clutter free. ? Installing safety rails on stairs. ? Using rubber mats in the bathroom and other areas that are often wet or slippery. Get help right away if: You fall or injure yourself. This information is not intended to replace advice given to you by your health care provider. Make sure you discuss any questions you have with your health care provider. Document Released: 02/17/2005 Document Revised: 10/13/2015 Document Reviewed: 10/18/2013 Elsevier Interactive Patient Education  2017 [employer].  Alendronate tablets What is this medicine? ALENDRONATE (a LEN droe nate) slows calcium loss from bones. It helps to make normal healthy bone and to slow bone loss in people with Paget's disease and osteoporosis. It may be used in others at risk for bone loss. This medicine may be used for other purposes; ask your health care provider or pharmacist if you have questions. COMMON BRAND NAME(S): Fosamax What should I tell my health care provider before I take this medicine? They need to know if you have any of these conditions: -dental disease -esophagus, stomach, or intestine problems, like acid reflux or GERD -kidney disease -low  blood calcium -low vitamin D -problems sitting or standing 30 minutes -trouble swallowing -an unusual or allergic reaction to alendronate, other medicines, foods, dyes, or preservatives -pregnant or trying to get pregnant -breast-feeding How should I use this medicine? You must take this medicine exactly as directed or you  will lower the amount of the medicine you absorb into your body or you may cause yourself harm. Take this medicine by mouth first thing in the morning, after you are up for the day. Do not eat or drink anything before you take your medicine. Swallow the tablet with a full glass (6 to 8 fluid ounces) of plain water. Do not take this medicine with any other drink. Do not chew or crush the tablet. After taking this medicine, do not eat breakfast, drink, or take any medicines or vitamins for at least 30 minutes. Sit or stand up for at least 30 minutes after you take this medicine; do not lie down. Do not take your medicine more often than directed. Talk to your pediatrician regarding the use of this medicine in children. Special care may be needed. Overdosage: If you think you have taken too much of this medicine contact a poison control center or emergency room at once. NOTE: This medicine is only for you. Do not share this medicine with others. What if I miss a dose? If you miss a dose, do not take it later in the day. Continue your normal schedule starting the next morning. Do not take double or extra doses. What may interact with this medicine? -aluminum hydroxide -antacids -aspirin -calcium supplements -drugs for inflammation like ibuprofen, naproxen, and others -iron supplements -magnesium supplements -vitamins with minerals This list may not describe all possible interactions. Give your health care provider a list of all the medicines, herbs, non-prescription drugs, or dietary supplements you use. Also tell them if you smoke, drink alcohol, or use illegal drugs. Some items may interact with your medicine. What should I watch for while using this medicine? Visit your doctor or health care professional for regular checks ups. It may be some time before you see benefit from this medicine. Do not stop taking your medicine except on your doctor's advice. Your doctor or health care professional may  order blood tests and other tests to see how you are doing. You should make sure you get enough calcium and vitamin D while you are taking this medicine, unless your doctor tells you not to. Discuss the foods you eat and the vitamins you take with your health care professional. Some people who take this medicine have severe bone, joint, and/or muscle pain. This medicine may also increase your risk for a broken thigh bone. Tell your doctor right away if you have pain in your upper leg or groin. Tell your doctor if you have any pain that does not go away or that gets worse. This medicine can make you more sensitive to the sun. If you get a rash while taking this medicine, sunlight may cause the rash to get worse. Keep out of the sun. If you cannot avoid being in the sun, wear protective clothing and use sunscreen. Do not use sun lamps or tanning beds/booths. What side effects may I notice from receiving this medicine? Side effects that you should report to your doctor or health care professional as soon as possible: -allergic reactions like skin rash, itching or hives, swelling of the face, lips, or tongue -black or tarry stools -bone, muscle or joint pain -changes in  vision -chest pain -heartburn or stomach pain -jaw pain, especially after dental work -pain or trouble when swallowing -redness, blistering, peeling or loosening of the skin, including inside the mouth Side effects that usually do not require medical attention (report to your doctor or health care professional if they continue or are bothersome): -changes in taste -diarrhea or constipation -eye pain or itching -headache -nausea or vomiting -stomach gas or fullness This list may not describe all possible side effects. Call your doctor for medical advice about side effects. You may report side effects to FDA at 1-800-FDA-1088. Where should I keep my medicine? Keep out of the reach of children. Store at room temperature of 15 and 30  degrees C (59 and 86 degrees F). Throw away any unused medicine after the expiration date. NOTE: This sheet is a summary. It may not cover all possible information. If you have questions about this medicine, talk to your doctor, pharmacist, or health care provider.  2018 Elsevier/Gold Standard (2010-11-06 08:56:09)   Denosumab injection What is this medicine? DENOSUMAB (den oh sue mab) slows bone breakdown. Prolia is used to treat osteoporosis in women after menopause and in men. Delton See is used to treat a high calcium level due to cancer and to prevent bone fractures and other bone problems caused by multiple myeloma or cancer bone metastases. Delton See is also used to treat giant cell tumor of the bone. This medicine may be used for other purposes; ask your health care provider or pharmacist if you have questions. COMMON BRAND NAME(S): Prolia, XGEVA What should I tell my health care provider before I take this medicine? They need to know if you have any of these conditions: -dental disease -having surgery or tooth extraction -infection -kidney disease -low levels of calcium or Vitamin D in the blood -malnutrition -on hemodialysis -skin conditions or sensitivity -thyroid or parathyroid disease -an unusual reaction to denosumab, other medicines, foods, dyes, or preservatives -pregnant or trying to get pregnant -breast-feeding How should I use this medicine? This medicine is for injection under the skin. It is given by a health care professional in a hospital or clinic setting. If you are getting Prolia, a special MedGuide will be given to you by the pharmacist with each prescription and refill. Be sure to read this information carefully each time. For Prolia, talk to your pediatrician regarding the use of this medicine in children. Special care may be needed. For Delton See, talk to your pediatrician regarding the use of this medicine in children. While this drug may be prescribed for children as  young as 13 years for selected conditions, precautions do apply. Overdosage: If you think you have taken too much of this medicine contact a poison control center or emergency room at once. NOTE: This medicine is only for you. Do not share this medicine with others. What if I miss a dose? It is important not to miss your dose. Call your doctor or health care professional if you are unable to keep an appointment. What may interact with this medicine? Do not take this medicine with any of the following medications: -other medicines containing denosumab This medicine may also interact with the following medications: -medicines that lower your chance of fighting infection -steroid medicines like prednisone or cortisone This list may not describe all possible interactions. Give your health care provider a list of all the medicines, herbs, non-prescription drugs, or dietary supplements you use. Also tell them if you smoke, drink alcohol, or use illegal drugs. Some items may  interact with your medicine. What should I watch for while using this medicine? Visit your doctor or health care professional for regular checks on your progress. Your doctor or health care professional may order blood tests and other tests to see how you are doing. Call your doctor or health care professional for advice if you get a fever, chills or sore throat, or other symptoms of a cold or flu. Do not treat yourself. This drug may decrease your body's ability to fight infection. Try to avoid being around people who are sick. You should make sure you get enough calcium and vitamin D while you are taking this medicine, unless your doctor tells you not to. Discuss the foods you eat and the vitamins you take with your health care professional. See your dentist regularly. Brush and floss your teeth as directed. Before you have any dental work done, tell your dentist you are receiving this medicine. Do not become pregnant while taking  this medicine or for 5 months after stopping it. Talk with your doctor or health care professional about your birth control options while taking this medicine. Women should inform their doctor if they wish to become pregnant or think they might be pregnant. There is a potential for serious side effects to an unborn child. Talk to your health care professional or pharmacist for more information. What side effects may I notice from receiving this medicine? Side effects that you should report to your doctor or health care professional as soon as possible: -allergic reactions like skin rash, itching or hives, swelling of the face, lips, or tongue -bone pain -breathing problems -dizziness -jaw pain, especially after dental work -redness, blistering, peeling of the skin -signs and symptoms of infection like fever or chills; cough; sore throat; pain or trouble passing urine -signs of low calcium like fast heartbeat, muscle cramps or muscle pain; pain, tingling, numbness in the hands or feet; seizures -unusual bleeding or bruising -unusually weak or tired Side effects that usually do not require medical attention (report to your doctor or health care professional if they continue or are bothersome): -constipation -diarrhea -headache -joint pain -loss of appetite -muscle pain -runny nose -tiredness -upset stomach This list may not describe all possible side effects. Call your doctor for medical advice about side effects. You may report side effects to FDA at 1-800-FDA-1088. Where should I keep my medicine? This medicine is only given in a clinic, doctor's office, or other health care setting and will not be stored at home. NOTE: This sheet is a summary. It may not cover all possible information. If you have questions about this medicine, talk to your doctor, pharmacist, or health care provider.  2018 Elsevier/Gold Standard (2016-06-01 19:17:21)

## 2017-03-24 ENCOUNTER — Ambulatory Visit: Payer: Self-pay | Admitting: Psychiatry

## 2017-03-24 LAB — PTH, INTACT AND CALCIUM: PTH: 40 pg/mL (ref 15–65)

## 2017-03-25 NOTE — Addendum Note (Signed)
Addended by: Yisroel Ramming, Dietrich Pates E on: 03/25/2017 09:37 AM   Modules accepted: Orders

## 2017-03-30 ENCOUNTER — Telehealth: Payer: Self-pay | Admitting: Obstetrics and Gynecology

## 2017-03-30 NOTE — Telephone Encounter (Signed)
Spoke with patient. Advised of results and message as seen below from [place]. Patient is scheduled for 04/01/2017 at 1:40 pm for additional lab testing. Patient is agreeable to date and time.  Notes recorded by Nunzio Cobbs, MD on 03/25/2017 at 9:37 AM EDT Please contact patient and inform her of the normal parathyroid hormone level.  The requested calcium level was not processed by the lab. It seems that a separate tube needs to be drawn for the calcium for [facility].  My apologies, but I would like to have her return for the calcium level to be checked.  I will do a metabolic profile at the same time, as I do not see this in her EPIC chart.  We can benefit from also checking her renal function before initiating any treatment.  Encounter closed.

## 2017-03-30 NOTE — Telephone Encounter (Signed)
Please contact the patient in follow up to her needed blood redraw for the metabolic profile.  This was not done at the time of the PTH testing.  My apologies and apologies from the lab.

## 2017-03-31 ENCOUNTER — Ambulatory Visit (INDEPENDENT_AMBULATORY_CARE_PROVIDER_SITE_OTHER): Payer: 59 | Admitting: Psychiatry

## 2017-03-31 DIAGNOSIS — F900 Attention-deficit hyperactivity disorder, predominantly inattentive type: Secondary | ICD-10-CM | POA: Diagnosis not present

## 2017-03-31 DIAGNOSIS — F411 Generalized anxiety disorder: Secondary | ICD-10-CM

## 2017-04-01 ENCOUNTER — Other Ambulatory Visit (INDEPENDENT_AMBULATORY_CARE_PROVIDER_SITE_OTHER): Payer: 59

## 2017-04-01 DIAGNOSIS — M810 Age-related osteoporosis without current pathological fracture: Secondary | ICD-10-CM

## 2017-04-02 LAB — COMPREHENSIVE METABOLIC PANEL
ALT: 20 IU/L (ref 0–32)
AST: 27 IU/L (ref 0–40)
Albumin/Globulin Ratio: 1.8 (ref 1.2–2.2)
Albumin: 4.1 g/dL (ref 3.6–4.8)
Alkaline Phosphatase: 63 IU/L (ref 39–117)
BUN/Creatinine Ratio: 21 (ref 12–28)
BUN: 13 mg/dL (ref 8–27)
Bilirubin Total: 0.6 mg/dL (ref 0.0–1.2)
CO2: 25 mmol/L (ref 20–29)
Calcium: 8.8 mg/dL (ref 8.7–10.3)
Chloride: 96 mmol/L (ref 96–106)
Creatinine, Ser: 0.62 mg/dL (ref 0.57–1.00)
GFR calc Af Amer: 109 mL/min/{1.73_m2} (ref >59)
GFR calc non Af Amer: 95 mL/min/{1.73_m2} (ref >59)
Globulin, Total: 2.3 g/dL (ref 1.5–4.5)
Glucose: 94 mg/dL (ref 65–99)
Potassium: 4.4 mmol/L (ref 3.5–5.2)
Sodium: 132 mmol/L — ABNORMAL LOW (ref 134–144)
Total Protein: 6.4 g/dL (ref 6.0–8.5)

## 2017-04-04 ENCOUNTER — Telehealth: Payer: Self-pay

## 2017-04-04 NOTE — Telephone Encounter (Signed)
Left message to call Latayvia Mandujano at [phone]. 

## 2017-04-04 NOTE — Telephone Encounter (Signed)
Spoke with patient. Advised of results as seen below from [place]. Patient verbalizes understanding. Will follow up with PCP. Results faxed to Dr.Wallace. Encounter closed.

## 2017-04-04 NOTE — Telephone Encounter (Signed)
-----   Message from Nunzio Cobbs, MD sent at 04/02/2017  7:12 PM EST ----- Please report metabolic profile lab results to the patient.  This was primarily to check her calcium level due to her new diagnosis of osteoporosis.  The calcium level is normal.  I did not that her sodium has been chronically low for years. The reasons for this could be multiple and not necessarily due to her prior intestinal surgery or celiac disease.  Sometimes there is an alteration in the hormone that regulates water and sodium level in the body. I recommend she follow up with her PCP.

## 2017-04-04 NOTE — Telephone Encounter (Signed)
Patient returned call to [facility].

## 2017-04-04 NOTE — Telephone Encounter (Signed)
Left message to call Kaitlyn at [phone]. 

## 2017-04-07 ENCOUNTER — Ambulatory Visit (INDEPENDENT_AMBULATORY_CARE_PROVIDER_SITE_OTHER): Payer: 59 | Admitting: Psychiatry

## 2017-04-07 DIAGNOSIS — F900 Attention-deficit hyperactivity disorder, predominantly inattentive type: Secondary | ICD-10-CM | POA: Diagnosis not present

## 2017-04-07 DIAGNOSIS — F411 Generalized anxiety disorder: Secondary | ICD-10-CM | POA: Diagnosis not present

## 2017-04-13 ENCOUNTER — Ambulatory Visit: Payer: 59 | Admitting: Family Medicine

## 2017-04-13 ENCOUNTER — Encounter: Payer: Self-pay | Admitting: Family Medicine

## 2017-04-13 VITALS — BP 90/60 | HR 75 | Temp 99.9°F | Ht 63.75 in | Wt 129.0 lb

## 2017-04-13 DIAGNOSIS — R05 Cough: Secondary | ICD-10-CM

## 2017-04-13 DIAGNOSIS — R059 Cough, unspecified: Secondary | ICD-10-CM

## 2017-04-13 MED ORDER — BENZONATATE 200 MG PO CAPS: 200.0000 mg | ORAL_CAPSULE | Freq: Two times a day (BID) | ORAL | 0 refills | Status: AC | PRN

## 2017-04-13 MED ORDER — IPRATROPIUM BROMIDE 0.06 % NA SOLN
2.0000 | Freq: Four times a day (QID) | NASAL | 0 refills | Status: DC
Start: 2017-04-13 — End: 2017-04-22

## 2017-04-13 MED ORDER — METHYLPREDNISOLONE ACETATE 80 MG/ML IJ SUSP
80.0000 mg | Freq: Once | INTRAMUSCULAR | Status: AC
  Administered 2017-04-13: 80 mg via INTRAMUSCULAR

## 2017-04-13 MED ORDER — AZITHROMYCIN 250 MG PO TABS: ORAL_TABLET | ORAL | 0 refills | Status: AC

## 2017-04-13 NOTE — Progress Notes (Signed)
    Subjective:  Mary Leach is a 66 y.o. female who presents today with a chief complaint of Cough.   HPI:  Cough, Acute Issue Symptoms started about 3 days ago. Worsening over the last few days. Associated symptoms include rhinorrhea, nasal congestion, sore throat, headache, chills. Recently with grandchild that had similar symptoms. Also with fatigue. Tried hot tea. No other medications tried.  No other obvious alleviating or aggravating factors.  ROS: Per HPI  PMH: Smoking history reviewed. Never smoker.   Objective:  Physical Exam: BP 90/60 (BP Location: Left Arm, Patient Position: Sitting, Cuff Size: Normal)   Pulse 75   Temp 99.9 F (37.7 C) (Oral)   Ht 5' 3.75" (1.619 m)   Wt 129 lb (58.5 kg)   LMP 05/24/2006   SpO2 97%   BMI 22.32 kg/m   Gen: NAD, resting comfortably HEENT: TMs clear bilaterally.  Oropharynx clear without exudate.  Left maxillary sinus with decreased transillumination.  Right maxillary sinus clear.  Frontal sinus clear. CV: RRR with no murmurs appreciated Pulm: NWOB, CTAB with no crackles, wheezes, or rhonchi  Assessment/Plan:  Cough, acute issue Likely viral URI.  She does have some maxillary congestion, however no current signs of bacterial infection.  Start Atrovent nasal spray.  Also start Tessalon as needed for cough.  Encouraged good oral hydration.  Given sore throat and desire for rapid recovery, patient was given IM injection of Depo-Medrol today.  A prescription for azithromycin was sent into the pharmacy-patient was instructed not start this unless symptoms do not improve within the next 3-5 days.  Patient agreed to this plan.  Return precautions reviewed.  Follow-up as needed.  Algis Greenhouse. Jerline Pain, MD 04/13/2017 10:33 AM

## 2017-04-21 ENCOUNTER — Ambulatory Visit (INDEPENDENT_AMBULATORY_CARE_PROVIDER_SITE_OTHER): Payer: 59 | Admitting: Psychiatry

## 2017-04-21 DIAGNOSIS — F900 Attention-deficit hyperactivity disorder, predominantly inattentive type: Secondary | ICD-10-CM

## 2017-04-21 DIAGNOSIS — F411 Generalized anxiety disorder: Secondary | ICD-10-CM

## 2017-04-22 ENCOUNTER — Ambulatory Visit: Payer: 59 | Admitting: Family Medicine

## 2017-04-22 ENCOUNTER — Encounter: Payer: Self-pay | Admitting: Family Medicine

## 2017-04-22 VITALS — BP 118/74 | HR 72 | Temp 97.9°F | Wt 139.8 lb

## 2017-04-22 DIAGNOSIS — J320 Chronic maxillary sinusitis: Secondary | ICD-10-CM | POA: Diagnosis not present

## 2017-04-22 MED ORDER — CEFDINIR 300 MG PO CAPS: 300.0000 mg | ORAL_CAPSULE | Freq: Two times a day (BID) | ORAL | 0 refills | Status: AC

## 2017-04-22 NOTE — Progress Notes (Signed)
Mary Leach is a 66 y.o. female here for an acute visit.  History of Present Illness:   Sinus Problem  This is a recurrent problem. The current episode started 1 to 4 weeks ago. The problem has been gradually worsening since onset. There has been no fever. The pain is moderate. Associated symptoms include sinus pressure, a sore throat and swollen glands. Past treatments include saline sprays. The treatment provided no relief.   PMHx, SurgHx, SocialHx, Medications, and Allergies were reviewed in the Visit Navigator and updated as appropriate.  Current Medications:   .  ADVAIR DISKUS 100-50 MCG/DOSE AEPB, Inhale 1 puff into the lungs 2 (two) times daily. , Disp: , Rfl:  .  b complex vitamins capsule, Take 1 capsule by mouth daily., Disp: , Rfl:  .  Cholecalciferol (VITAMIN D PO), Take 5,000 each by mouth daily., Disp: , Rfl:  .  EPINEPHrine (EPI-PEN) 0.3 mg/0.3 mL DEVI, Inject 0.3 mg into the muscle once. For previous allergies, maybe gluten, Disp: , Rfl:  .  FLUoxetine (PROZAC) 10 MG capsule, TAKE 3 CAPSULES DAILY AS DIRECTED., Disp: 93 capsule, Rfl: 1 .  ipratropium (ATROVENT) 0.06 % nasal spray, Place 2 sprays into both nostrils 4 (four) times daily for 5 days., Disp: 15 mL, Rfl: 0 .  multivitamin-lutein (OCUVITE-LUTEIN) CAPS, Take 1 capsule by mouth daily., Disp: , Rfl:  .  NON FORMULARY, , Disp: , Rfl:  .  Omega-3 Fatty Acids (SALMON OIL PO), Take 1,300 mg by mouth daily., Disp: , Rfl:  .  Probiotic Product (PROBIOTIC DAILY PO), Take by mouth. metaflora IB, Disp: , Rfl:  .  Progesterone Micronized (PROGESTERONE PO), Take by mouth., Disp: , Rfl:  .  selenium 50 MCG TABS tablet, Take 50 mcg by mouth daily., Disp: , Rfl:  .  thyroid (ARMOUR) 90 MG tablet, Take 90 mg by mouth daily., Disp: , Rfl:  .  UNABLE TO FIND, daily. Med Name: I-Throid 6.27m 1 daily m-w-f, Disp: , Rfl:  .  VITAMIN K PO, Take 150 mcg by mouth daily., Disp: , Rfl:  .  VIVELLE-DOT 0.05 MG/24HR patch, , Disp: , Rfl:    .  Zinc 22.5 MG TABS, Take 22.5 mg by mouth daily., Disp: , Rfl:    Allergies  Allergen Reactions  . Augmentin [Amoxicillin-Pot Clavulanate] Diarrhea  . Other     Multiple food intolerances. Eggs, corn, potatoes, yeast, millett, buckwheat, sesame oil, soy.  Also observes a dairy and wheat free diet.  .Marland KitchenPrevnar [Pneumococcal 13-Val Conj Vacc]     Vaccine contains yeast   . Sulfonamide Derivatives Rash    Arms only.  . Tape Rash   Review of Systems:   Pertinent items are noted in the HPI. Otherwise, ROS is negative.  Vitals:   Vitals:   04/22/17 1140  BP: 118/74  Pulse: 72  Temp: 97.9 F (36.6 C)  TempSrc: Oral  SpO2: 99%  Weight: 139 lb 12.8 oz (63.4 kg)     Body mass index is 24.19 kg/m.   Physical Exam:   Physical Exam  Constitutional: She appears well-nourished.  HENT:  Head: Normocephalic and atraumatic.  Nose: Mucosal edema present. Right sinus exhibits maxillary sinus tenderness and frontal sinus tenderness.  Eyes: EOM are normal. Pupils are equal, round, and reactive to light.  Neck: Normal range of motion. Neck supple.  Cardiovascular: Normal rate, regular rhythm, normal heart sounds and intact distal pulses.  Pulmonary/Chest: Effort normal.  Abdominal: Soft.  Skin: Skin is warm.  Psychiatric: She has a normal mood and affect. Her behavior is normal.  Nursing note and vitals reviewed.   Assessment and Plan:   Vanisha was seen today for nasal congestion and cough.  Diagnoses and all orders for this visit:  Chronic maxillary sinusitis -     cefdinir (OMNICEF) 300 MG capsule; Take 1 capsule (300 mg total) by mouth 2 (two) times daily for 14 days.   . Reviewed expectations re: course of current medical issues. . Discussed self-management of symptoms. . Outlined signs and symptoms indicating need for more acute intervention. . Patient verbalized understanding and all questions were answered. Marland Kitchen Health Maintenance issues including appropriate healthy diet,  exercise, and smoking avoidance were discussed with patient. . See orders for this visit as documented in the electronic medical record. . Patient received an After Visit Summary.  Briscoe Deutscher, DO [place] [place] 04/23/2017  Future Appointments  Date Time Provider [place]  04/28/2017 11:00 AM Yaakov Guthrie, PsyD [facility] None  05/05/2017 11:00 AM Yaakov Guthrie, PsyD [facility] None  05/12/2017 11:00 AM Yaakov Guthrie, PsyD [facility] None  05/19/2017 11:00 AM Yaakov Guthrie, PsyD [facility] None  05/26/2017 11:00 AM Yaakov Guthrie, PsyD [facility] None  06/02/2017 11:00 AM Yaakov Guthrie, PsyD [facility] None  06/09/2017 11:00 AM Yaakov Guthrie, PsyD [facility] None  06/16/2017 11:00 AM Yaakov Guthrie, PsyD [facility] None  06/23/2017 11:00 AM Yaakov Guthrie, PsyD [facility] None  06/30/2017 11:00 AM Yaakov Guthrie, PsyD [facility] None  07/07/2017 11:00 AM Yaakov Guthrie, PsyD [facility] None  07/14/2017 11:00 AM Yaakov Guthrie, PsyD [facility] None  07/21/2017 11:00 AM Yaakov Guthrie, PsyD [facility] None  07/28/2017 11:00 AM Yaakov Guthrie, PsyD [facility] None  10/20/2017  9:00 AM Briscoe Deutscher, DO [facility]  01/12/2018  3:45 PM Amundson Raliegh Ip, MD [place] None

## 2017-04-28 ENCOUNTER — Ambulatory Visit (INDEPENDENT_AMBULATORY_CARE_PROVIDER_SITE_OTHER): Payer: 59 | Admitting: Psychiatry

## 2017-04-28 DIAGNOSIS — F411 Generalized anxiety disorder: Secondary | ICD-10-CM

## 2017-04-28 DIAGNOSIS — F900 Attention-deficit hyperactivity disorder, predominantly inattentive type: Secondary | ICD-10-CM | POA: Diagnosis not present

## 2017-05-02 ENCOUNTER — Other Ambulatory Visit: Payer: Self-pay | Admitting: Family Medicine

## 2017-05-05 ENCOUNTER — Ambulatory Visit: Payer: 59 | Admitting: Psychiatry

## 2017-05-12 ENCOUNTER — Ambulatory Visit (INDEPENDENT_AMBULATORY_CARE_PROVIDER_SITE_OTHER): Payer: 59 | Admitting: Psychiatry

## 2017-05-12 DIAGNOSIS — F900 Attention-deficit hyperactivity disorder, predominantly inattentive type: Secondary | ICD-10-CM | POA: Diagnosis not present

## 2017-05-12 DIAGNOSIS — F411 Generalized anxiety disorder: Secondary | ICD-10-CM

## 2017-05-19 ENCOUNTER — Ambulatory Visit: Payer: Self-pay | Admitting: Psychiatry

## 2017-05-24 DIAGNOSIS — R748 Abnormal levels of other serum enzymes: Secondary | ICD-10-CM

## 2017-05-24 DIAGNOSIS — R7989 Other specified abnormal findings of blood chemistry: Secondary | ICD-10-CM

## 2017-05-24 HISTORY — DX: Abnormal levels of other serum enzymes: R74.8

## 2017-05-24 HISTORY — DX: Other specified abnormal findings of blood chemistry: R79.89

## 2017-05-26 ENCOUNTER — Ambulatory Visit (INDEPENDENT_AMBULATORY_CARE_PROVIDER_SITE_OTHER): Payer: 59 | Admitting: Psychiatry

## 2017-05-26 DIAGNOSIS — F411 Generalized anxiety disorder: Secondary | ICD-10-CM

## 2017-05-26 DIAGNOSIS — F900 Attention-deficit hyperactivity disorder, predominantly inattentive type: Secondary | ICD-10-CM | POA: Diagnosis not present

## 2017-06-02 ENCOUNTER — Ambulatory Visit (INDEPENDENT_AMBULATORY_CARE_PROVIDER_SITE_OTHER): Payer: 59 | Admitting: Psychiatry

## 2017-06-02 DIAGNOSIS — F900 Attention-deficit hyperactivity disorder, predominantly inattentive type: Secondary | ICD-10-CM | POA: Diagnosis not present

## 2017-06-02 DIAGNOSIS — F411 Generalized anxiety disorder: Secondary | ICD-10-CM

## 2017-06-09 ENCOUNTER — Ambulatory Visit: Payer: Self-pay | Admitting: Psychiatry

## 2017-06-13 ENCOUNTER — Ambulatory Visit: Payer: 59 | Admitting: Family Medicine

## 2017-06-13 ENCOUNTER — Encounter: Payer: Self-pay | Admitting: Family Medicine

## 2017-06-13 VITALS — BP 108/76 | HR 78 | Temp 98.6°F | Wt 127.6 lb

## 2017-06-13 DIAGNOSIS — F902 Attention-deficit hyperactivity disorder, combined type: Secondary | ICD-10-CM

## 2017-06-13 DIAGNOSIS — H01136 Eczematous dermatitis of left eye, unspecified eyelid: Secondary | ICD-10-CM

## 2017-06-13 MED ORDER — TACROLIMUS 0.03 % EX OINT: TOPICAL_OINTMENT | Freq: Two times a day (BID) | CUTANEOUS | 0 refills | Status: DC

## 2017-06-13 NOTE — Progress Notes (Signed)
Mary Leach is a 67 y.o. female is here for follow up.  History of Present Illness:   HPI: See Assessment and Plan section for Problem Based Charting of issues discussed today.   There are no preventive care reminders to display for this patient.   Depression screen [facility] 2/9 06/13/2017 03/17/2017  Decreased Interest 0 0  Down, Depressed, Hopeless 0 0  PHQ - 2 Score 0 0  Altered sleeping 0 -  Tired, decreased energy 0 -  Change in appetite 0 -  Feeling bad or failure about yourself  0 -  Trouble concentrating 2 -  Moving slowly or fidgety/restless 0 -  Suicidal thoughts 0 -  PHQ-9 Score 2 -   PMHx, SurgHx, SocialHx, FamHx, Medications, and Allergies were reviewed in the Visit Navigator and updated as appropriate.   Patient Active Problem List   Diagnosis Date Noted  . Dry scalp 09/28/2016  . Celiac sprue 07/02/2015  . Allergic rhinitis due to pollen 12/18/2014  . H/O resection of large bowel 12/15/2013  . 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 Asthma 10/18/2011  . ADHD (attention deficit hyperactivity disorder) 10/18/2011  . 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 Smoking status: Never Smoker  . Smokeless tobacco: Never Used  Substance Use Topics  . Alcohol use: Yes    Alcohol/week: 1.8 oz    Types: 3 Standard drinks or equivalent per week    Comment: 1-3 glasses of wine a week or gluten free beer  . Drug use: No   Current Medications and Allergies:   Current Outpatient Medications:  .  ADVAIR DISKUS 100-50 MCG/DOSE AEPB, Inhale 1 puff into the lungs 2 (two) times daily. , Disp: , Rfl:  .  Ascorbic Acid (VITAMIN C) 100 MG tablet, Take 100 mg by mouth daily., Disp: , Rfl:  .  b complex vitamins capsule, Take 1 capsule by mouth daily., Disp: , Rfl:  .  Cholecalciferol (VITAMIN D PO), Take 5,000 each by mouth daily., Disp: , Rfl:  .  EPINEPHrine (EPI-PEN) 0.3 mg/0.3  mL DEVI, Inject 0.3 mg into the muscle once. For previous allergies, maybe gluten, Disp: , Rfl:  .  FLUoxetine (PROZAC) 10 MG capsule, TAKE 3 CAPSULES DAILY AS DIRECTED., Disp: 93 capsule, Rfl: 1 .  IODINE EX, Apply topically., Disp: , Rfl:  .  multivitamin-lutein (OCUVITE-LUTEIN) CAPS, Take 1 capsule by mouth daily., Disp: , Rfl:  .  NON FORMULARY, , Disp: , Rfl:  .  Omega-3 Fatty Acids (SALMON OIL PO), Take 1,300 mg by mouth daily., Disp: , Rfl:  .  Probiotic Product (PROBIOTIC DAILY PO), Take by mouth. metaflora IB, Disp: , Rfl:  .  Progesterone Micronized (PROGESTERONE PO), Take by mouth., Disp: , Rfl:  .  selenium 50 MCG TABS tablet, Take 50 mcg by mouth daily., Disp: , Rfl:  .  thyroid (ARMOUR) 90 MG tablet, Take 90 mg by mouth daily., Disp: , Rfl:  .  UNABLE TO FIND, daily. Med Name: I-Throid 6.58m 1 daily m-w-f, Disp: , Rfl:  .  VITAMIN K PO, Take 150 mcg by mouth daily., Disp: , Rfl:  .  VIVELLE-DOT 0.05 MG/24HR patch, , Disp: , Rfl:    Allergies  Allergen Reactions  . Augmentin [Amoxicillin-Pot Clavulanate] Diarrhea  . Other     Multiple food intolerances. Eggs, corn, potatoes, yeast, millett, buckwheat, sesame oil, soy.  Also observes a dResearch officer, political party  and wheat free diet.  Marland Kitchen Prevnar [Pneumococcal 13-Val Conj Vacc]     Vaccine contains yeast   . Sulfonamide Derivatives Rash    Arms only.  . Tape Rash   Review of Systems   Pertinent items are noted in the HPI. Otherwise, ROS is negative.  Vitals:   Vitals:   06/13/17 0921  BP: 108/76  Pulse: 78  Temp: 98.6 F (37 C)  TempSrc: Oral  SpO2: 98%  Weight: 127 lb 9.6 oz (57.9 kg)     Body mass index is 22.07 kg/m.   Physical Exam:   Physical Exam  Constitutional: She appears well-nourished.  HENT:  Head: Normocephalic and atraumatic.  Eyes: EOM are normal. Pupils are equal, round, and reactive to light.  Neck: Normal range of motion. Neck supple.  Cardiovascular: Normal rate, regular rhythm, normal heart sounds and  intact distal pulses.  Pulmonary/Chest: Effort normal.  Abdominal: Soft.  Skin: Skin is warm.  Mild left upper eyelid dermatitis.  Psychiatric: She has a normal mood and affect. Her behavior is normal.  Nursing note and vitals reviewed.   Assessment and Plan:   Mary Leach was seen today for rash.  Diagnoses and all orders for this visit:  Eyelid dermatitis, eczematous, left Comments: Patient has a history of the same.  She was previously treated with Protopic with good results.  I will go ahead and represcribed that medication today.  We discussed red flags or following up. Orders: -     tacrolimus (PROTOPIC) 0.03 % ointment; Apply topically 2 (two) times daily.  Attention deficit hyperactivity disorder (ADHD), combined type Comments: The patient reports a history of the same.  She states that she was on Ritalin previously with good results.  I will go ahead and refer to [facility] Att[facility] Specialist for testing and treatment.   . Reviewed expectations re: course of current medical issues. . Discussed self-management of symptoms. . Outlined signs and symptoms indicating need for more acute intervention. . Patient verbalized understanding and all questions were answered. Marland Kitchen Health Maintenance issues including appropriate healthy diet, exercise, and smoking avoidance were discussed with patient. . See orders for this visit as documented in the electronic medical record. . Patient received an After Visit Summary.  Briscoe Deutscher, DO [place] 06/13/2017  Future Appointments  Date Time Provider [place]  06/16/2017 11:00 AM Yaakov Guthrie, PsyD [facility] None  06/23/2017 11:00 AM Yaakov Guthrie, PsyD [facility] None  06/30/2017 11:00 AM Yaakov Guthrie, PsyD [facility] None  07/07/2017 11:00 AM Yaakov Guthrie, PsyD [facility] None  07/14/2017 11:00 AM Yaakov Guthrie, PsyD [facility] None  07/21/2017 11:00 AM Yaakov Guthrie, PsyD [facility] None    07/28/2017 11:00 AM Yaakov Guthrie, PsyD [facility] None  10/20/2017  9:00 AM Briscoe Deutscher, DO [facility]  01/12/2018  3:45 PM Amundson Raliegh Ip, MD [place] None

## 2017-06-16 ENCOUNTER — Ambulatory Visit (INDEPENDENT_AMBULATORY_CARE_PROVIDER_SITE_OTHER): Payer: 59 | Admitting: Psychiatry

## 2017-06-16 DIAGNOSIS — F411 Generalized anxiety disorder: Secondary | ICD-10-CM

## 2017-06-16 DIAGNOSIS — F900 Attention-deficit hyperactivity disorder, predominantly inattentive type: Secondary | ICD-10-CM | POA: Diagnosis not present

## 2017-06-23 ENCOUNTER — Ambulatory Visit: Payer: Self-pay | Admitting: Psychiatry

## 2017-06-28 ENCOUNTER — Encounter: Payer: Self-pay | Admitting: Family Medicine

## 2017-06-28 ENCOUNTER — Ambulatory Visit: Payer: 59 | Admitting: Family Medicine

## 2017-06-28 VITALS — BP 106/68 | HR 76 | Temp 97.7°F | Ht 63.75 in | Wt 127.0 lb

## 2017-06-28 DIAGNOSIS — J4531 Mild persistent asthma with (acute) exacerbation: Secondary | ICD-10-CM

## 2017-06-28 DIAGNOSIS — J0101 Acute recurrent maxillary sinusitis: Secondary | ICD-10-CM

## 2017-06-28 MED ORDER — CEFDINIR 300 MG PO CAPS: 300.0000 mg | ORAL_CAPSULE | Freq: Two times a day (BID) | ORAL | 1 refills | Status: DC

## 2017-06-28 MED ORDER — METHYLPREDNISOLONE ACETATE 80 MG/ML IJ SUSP
80.0000 mg | Freq: Once | INTRAMUSCULAR | Status: AC
  Administered 2017-06-28: 80 mg via INTRAMUSCULAR

## 2017-06-28 NOTE — Progress Notes (Signed)
Mary Leach is a 67 y.o. female here for an acute visit.  History of Present Illness:   [name] CMA acting as scribe for Dr. Juleen China  HPI: Patient comes in today for an acute issue today. She has had post nasal drip, cough, congestion off and on for two weeks. She has taken a natural cold remedy that help. She stopped taking due t symptoms getting better. After she stopped taking symptoms came back.   PMHx, SurgHx, SocialHx, Medications, and Allergies were reviewed in the Visit Navigator and updated as appropriate.  Current Medications:   Current Outpatient Medications:  .  ADVAIR DISKUS 100-50 MCG/DOSE AEPB, Inhale 1 puff into the lungs 2 (two) times daily. , Disp: , Rfl:  .  Ascorbic Acid (VITAMIN C) 100 MG tablet, Take 100 mg by mouth daily., Disp: , Rfl:  .  b complex vitamins capsule, Take 1 capsule by mouth daily., Disp: , Rfl:  .  Cholecalciferol (VITAMIN D PO), Take 5,000 each by mouth daily., Disp: , Rfl:  .  EPINEPHrine (EPI-PEN) 0.3 mg/0.3 mL DEVI, Inject 0.3 mg into the muscle once. For previous allergies, maybe gluten, Disp: , Rfl:  .  FLUoxetine (PROZAC) 10 MG capsule, TAKE 3 CAPSULES DAILY AS DIRECTED., Disp: 93 capsule, Rfl: 1 .  IODINE EX, Apply topically., Disp: , Rfl:  .  multivitamin-lutein (OCUVITE-LUTEIN) CAPS, Take 1 capsule by mouth daily., Disp: , Rfl:  .  NON FORMULARY, , Disp: , Rfl:  .  Omega-3 Fatty Acids (SALMON OIL PO), Take 1,300 mg by mouth daily., Disp: , Rfl:  .  Probiotic Product (PROBIOTIC DAILY PO), Take by mouth. metaflora IB, Disp: , Rfl:  .  Progesterone Micronized (PROGESTERONE PO), Take by mouth., Disp: , Rfl:  .  selenium 50 MCG TABS tablet, Take 50 mcg by mouth daily., Disp: , Rfl:  .  tacrolimus (PROTOPIC) 0.03 % ointment, Apply topically 2 (two) times daily., Disp: 100 g, Rfl: 0 .  thyroid (ARMOUR) 90 MG tablet, Take 90 mg by mouth daily., Disp: , Rfl:  .  UNABLE TO FIND, daily. Med Name: I-Throid 6.4m 1 daily m-w-f, Disp: , Rfl:    .  VITAMIN K PO, Take 150 mcg by mouth daily., Disp: , Rfl:  .  VIVELLE-DOT 0.05 MG/24HR patch, , Disp: , Rfl:    Allergies  Allergen Reactions  . Augmentin [Amoxicillin-Pot Clavulanate] Diarrhea  . Other     Multiple food intolerances. Eggs, corn, potatoes, yeast, millett, buckwheat, sesame oil, soy.  Also observes a dairy and wheat free diet.  .Marland KitchenPrevnar [Pneumococcal 13-Val Conj Vacc]     Vaccine contains yeast   . Sulfonamide Derivatives Rash    Arms only.  . Tape Rash   Review of Systems:   Pertinent items are noted in the HPI. Otherwise, ROS is negative.  Vitals:   Vitals:   06/28/17 1012  BP: 106/68  Pulse: 76  Temp: 97.7 F (36.5 C)  TempSrc: Oral  SpO2: 97%  Weight: 127 lb (57.6 kg)  Height: 5' 3.75" (1.619 m)     Body mass index is 21.97 kg/m.  Physical Exam:   Physical Exam  Constitutional: She appears well-nourished.  HENT:  Head: Normocephalic and atraumatic.  Nose: Mucosal edema present. Right sinus exhibits maxillary sinus tenderness and frontal sinus tenderness.  Eyes: EOM are normal. Pupils are equal, round, and reactive to light.  Neck: Normal range of motion. Neck supple.  Cardiovascular: Normal rate, regular rhythm, normal heart sounds and intact  distal pulses.  Pulmonary/Chest: Effort normal. She has wheezes. She has rhonchi.  Abdominal: Soft.  Skin: Skin is warm.  Psychiatric: She has a normal mood and affect. Her behavior is normal.  Nursing note and vitals reviewed.   Assessment and Plan:   1. Acute recurrent maxillary sinusitis - cefdinir (OMNICEF) 300 MG capsule; Take 1 capsule (300 mg total) by mouth 2 (two) times daily.  Dispense: 28 capsule; Refill: 1 - methylPREDNISolone acetate (DEPO-MEDROL) injection 80 mg  2. Mild persistent asthma with acute exacerbation - methylPREDNISolone acetate (DEPO-MEDROL) injection 80 mg   . Reviewed expectations re: course of current medical issues. . Discussed self-management of  symptoms. . Outlined signs and symptoms indicating need for more acute intervention. . Patient verbalized understanding and all questions were answered. Marland Kitchen Health Maintenance issues including appropriate healthy diet, exercise, and smoking avoidance were discussed with patient. . See orders for this visit as documented in the electronic medical record. . Patient received an After Visit Summary.  CMA served as Education administrator during this visit. History, Physical, and Plan performed by medical provider. The above documentation has been reviewed and is accurate and complete. Briscoe Deutscher, D.O.  Briscoe Deutscher, DO [place], [facility] 06/28/2017

## 2017-06-30 ENCOUNTER — Ambulatory Visit: Payer: Self-pay | Admitting: Psychiatry

## 2017-07-05 ENCOUNTER — Other Ambulatory Visit: Payer: Self-pay | Admitting: Family Medicine

## 2017-07-07 ENCOUNTER — Ambulatory Visit: Payer: Self-pay | Admitting: Psychiatry

## 2017-07-14 ENCOUNTER — Ambulatory Visit: Payer: Self-pay | Admitting: Psychiatry

## 2017-07-21 ENCOUNTER — Ambulatory Visit (INDEPENDENT_AMBULATORY_CARE_PROVIDER_SITE_OTHER): Payer: 59 | Admitting: Psychiatry

## 2017-07-21 DIAGNOSIS — F900 Attention-deficit hyperactivity disorder, predominantly inattentive type: Secondary | ICD-10-CM | POA: Diagnosis not present

## 2017-07-21 DIAGNOSIS — F411 Generalized anxiety disorder: Secondary | ICD-10-CM | POA: Diagnosis not present

## 2017-07-28 ENCOUNTER — Ambulatory Visit: Payer: Self-pay | Admitting: Psychiatry

## 2017-08-04 ENCOUNTER — Ambulatory Visit (INDEPENDENT_AMBULATORY_CARE_PROVIDER_SITE_OTHER): Payer: 59 | Admitting: Psychiatry

## 2017-08-04 DIAGNOSIS — F411 Generalized anxiety disorder: Secondary | ICD-10-CM | POA: Diagnosis not present

## 2017-08-08 ENCOUNTER — Ambulatory Visit: Payer: 59 | Admitting: Family Medicine

## 2017-08-08 ENCOUNTER — Encounter: Payer: Self-pay | Admitting: Family Medicine

## 2017-08-08 VITALS — BP 110/68 | HR 71 | Temp 97.8°F | Wt 128.6 lb

## 2017-08-08 DIAGNOSIS — J0101 Acute recurrent maxillary sinusitis: Secondary | ICD-10-CM

## 2017-08-08 MED ORDER — LEVOFLOXACIN 500 MG PO TABS: 500.0000 mg | ORAL_TABLET | Freq: Every day | ORAL | 0 refills | Status: DC

## 2017-08-08 NOTE — Progress Notes (Signed)
Jonice Cerra is a 67 y.o. female here for an acute visit.  History of Present Illness:   [name], CMA acting as scribe for Dr. Briscoe Deutscher.   Sinusitis  This is a new problem. The current episode started 1 to 4 weeks ago. The problem has been gradually worsening since onset. There has been no fever. Her pain is at a severity of 2/10. Associated symptoms include congestion, coughing and sinus pressure. Pertinent negatives include no chills or ear pain. Past treatments include oral decongestants. The treatment provided no relief.   PMHx, SurgHx, SocialHx, Medications, and Allergies were reviewed in the Visit Navigator and updated as appropriate.  Current Medications:   .  ADVAIR DISKUS 100-50 MCG/DOSE AEPB, Inhale 1 puff into the lungs 2 (two) times daily. , Disp: , Rfl:  .  Cholecalciferol (VITAMIN D PO), Take 5,000 each by mouth daily., Disp: , Rfl:  .  EPINEPHrine (EPI-PEN) 0.3 mg/0.3 mL DEVI, Inject 0.3 mg into the muscle once. For previous allergies, maybe gluten, Disp: , Rfl:  .  FLUoxetine (PROZAC) 10 MG capsule, TAKE 3 CAPSULES DAILY AS DIRECTED., Disp: 93 capsule, Rfl: 4 .  multivitamin-lutein (OCUVITE-LUTEIN) CAPS, Take 1 capsule by mouth daily., Disp: , Rfl:  .  NON FORMULARY, , Disp: , Rfl:  .  Omega-3 Fatty Acids (SALMON OIL PO), Take 1,300 mg by mouth daily., Disp: , Rfl:  .  Probiotic Product (PROBIOTIC DAILY PO), Take by mouth. metaflora IB, Disp: , Rfl:  .  Progesterone Micronized (PROGESTERONE PO), Take by mouth., Disp: , Rfl:  .  selenium 50 MCG TABS tablet, Take 50 mcg by mouth daily., Disp: , Rfl:  .  thyroid (ARMOUR) 90 MG tablet, Take 90 mg by mouth daily., Disp: , Rfl:  .  VITAMIN K PO, Take 150 mcg by mouth daily., Disp: , Rfl:    Allergies  Allergen Reactions  . Augmentin [Amoxicillin-Pot Clavulanate] Diarrhea  . Other     Multiple food intolerances. Eggs, corn, potatoes, yeast, millett, buckwheat, sesame oil, soy.  Also observes a dairy and wheat  free diet.  Marland Kitchen Prevnar [Pneumococcal 13-Val Conj Vacc]     Vaccine contains yeast   . Sulfonamide Derivatives Rash    Arms only.  . Tape Rash   Review of Systems:   Pertinent items are noted in the HPI. Otherwise, ROS is negative.  Vitals:   Vitals:   08/08/17 1144  BP: 110/68  Pulse: 71  Temp: 97.8 F (36.6 C)  TempSrc: Oral  SpO2: 99%  Weight: 128 lb 9.6 oz (58.3 kg)     Body mass index is 22.25 kg/m.  Physical Exam:   Physical Exam  Constitutional: She appears well-developed and well-nourished. No distress.  HENT:  Head: Normocephalic and atraumatic.  Nose: Mucosal edema present. Right sinus exhibits maxillary sinus tenderness and frontal sinus tenderness. Left sinus exhibits maxillary sinus tenderness and frontal sinus tenderness.  Eyes: EOM are normal. Pupils are equal, round, and reactive to light.  Neck: Normal range of motion. Neck supple.  Cardiovascular: Normal rate, regular rhythm, normal heart sounds and intact distal pulses.  Pulmonary/Chest: Effort normal.  Abdominal: Soft.  Skin: Skin is warm.  Psychiatric: She has a normal mood and affect. Her behavior is normal.  Nursing note and vitals reviewed.   Assessment and Plan:   1. Acute recurrent maxillary sinusitis - levofloxacin (LEVAQUIN) 500 MG tablet; Take 1 tablet (500 mg total) by mouth daily.  Dispense: 10 tablet; Refill: 0   .  Reviewed expectations re: course of current medical issues. . Discussed self-management of symptoms. . Outlined signs and symptoms indicating need for more acute intervention. . Patient verbalized understanding and all questions were answered. Marland Kitchen Health Maintenance issues including appropriate healthy diet, exercise, and smoking avoidance were discussed with patient. . See orders for this visit as documented in the electronic medical record. . Patient received an After Visit Summary.  CMA served as Education administrator during this visit. History, Physical, and Plan performed by  medical provider. The above documentation has been reviewed and is accurate and complete. Briscoe Deutscher, D.O.  Briscoe Deutscher, DO [place], [facility] 08/08/2017

## 2017-08-11 ENCOUNTER — Ambulatory Visit: Payer: 59 | Admitting: Psychiatry

## 2017-08-18 ENCOUNTER — Ambulatory Visit (INDEPENDENT_AMBULATORY_CARE_PROVIDER_SITE_OTHER): Payer: 59 | Admitting: Psychiatry

## 2017-08-18 DIAGNOSIS — F411 Generalized anxiety disorder: Secondary | ICD-10-CM | POA: Diagnosis not present

## 2017-08-19 ENCOUNTER — Other Ambulatory Visit: Payer: Self-pay | Admitting: Obstetrics and Gynecology

## 2017-08-19 DIAGNOSIS — Z1231 Encounter for screening mammogram for malignant neoplasm of breast: Secondary | ICD-10-CM

## 2017-09-01 ENCOUNTER — Ambulatory Visit (INDEPENDENT_AMBULATORY_CARE_PROVIDER_SITE_OTHER): Payer: 59 | Admitting: Psychiatry

## 2017-09-01 DIAGNOSIS — F411 Generalized anxiety disorder: Secondary | ICD-10-CM | POA: Diagnosis not present

## 2017-09-15 ENCOUNTER — Ambulatory Visit: Payer: 59 | Admitting: Psychiatry

## 2017-09-19 ENCOUNTER — Ambulatory Visit (INDEPENDENT_AMBULATORY_CARE_PROVIDER_SITE_OTHER): Payer: 59 | Admitting: Podiatry

## 2017-09-19 ENCOUNTER — Encounter: Payer: Self-pay | Admitting: Podiatry

## 2017-09-19 ENCOUNTER — Ambulatory Visit (INDEPENDENT_AMBULATORY_CARE_PROVIDER_SITE_OTHER): Payer: 59

## 2017-09-19 ENCOUNTER — Other Ambulatory Visit: Payer: Self-pay | Admitting: Podiatry

## 2017-09-19 DIAGNOSIS — M779 Enthesopathy, unspecified: Secondary | ICD-10-CM

## 2017-09-19 DIAGNOSIS — M778 Other enthesopathies, not elsewhere classified: Secondary | ICD-10-CM

## 2017-09-19 DIAGNOSIS — M659 Synovitis and tenosynovitis, unspecified: Secondary | ICD-10-CM

## 2017-09-20 NOTE — Progress Notes (Signed)
   HPI: 67 year old female presenting today with a chief complaint of intermittent pain to the dorsum of the left foot that began about one year ago. She reports associated pain to the arch and posterior heel. She reports associated swelling of the area. There are no modifying factors noted. She has been wearing an ankle brace and taking OTC Aleve for treatment. Patient is here for further evaluation and treatment.   Past Medical History:  Diagnosis Date  . ADD (attention deficit disorder)   . Allergic rhinitis   . Anxiety   . Asthma   . Bipolar disorder ([place])   . Celiac disease   . Depression   . History of colonic polyps   . Ileus following gastrointestinal surgery ([place]) 10/18/2011  . Malnutrition following gastrointestinal surgery 10/18/2011  . Osteoporosis 2018  . RBBB (right bundle branch block)      Physical Exam: General: The patient is alert and oriented x3 in no acute distress.  Dermatology: Skin is warm, dry and supple bilateral lower extremities. Negative for open lesions or macerations.  Vascular: Palpable pedal pulses bilaterally. No edema or erythema noted. Capillary refill within normal limits.  Neurological: Epicritic and protective threshold grossly intact bilaterally.   Musculoskeletal Exam: Pain with palpation to the dorsum of the left foot. Range of motion within normal limits to all pedal and ankle joints bilateral. Muscle strength 5/5 in all groups bilateral.   Radiographic Exam:  Normal osseous mineralization. Joint spaces preserved. No fracture/dislocation/boney destruction.    Assessment: 1. Left midfoot capsulitis    Plan of Care:  1. Patient evaluated. X-Rays reviewed.  2. Injection of 0.5 mLs Celestone Soluspan injected into the dorsal left midfoot.  3. Continue wearing ankle brace and taking OTC Aleve as needed.  4. Return to clinic as needed.      Edrick Kins, DPM [facility]  Dr. Edrick Kins, DPM    [address], [place] [address]                Office [phone]  Fax [phone]

## 2017-09-27 ENCOUNTER — Ambulatory Visit
Admission: RE | Admit: 2017-09-27 | Discharge: 2017-09-27 | Disposition: A | Discharge status: Home / Self Care | Payer: 59 | Source: Ambulatory Visit | Attending: Obstetrics and Gynecology | Admitting: Obstetrics and Gynecology

## 2017-09-27 DIAGNOSIS — Z1231 Encounter for screening mammogram for malignant neoplasm of breast: Secondary | ICD-10-CM

## 2017-09-29 ENCOUNTER — Ambulatory Visit: Payer: 59 | Admitting: Psychiatry

## 2017-10-13 ENCOUNTER — Ambulatory Visit (INDEPENDENT_AMBULATORY_CARE_PROVIDER_SITE_OTHER): Payer: 59 | Admitting: Psychiatry

## 2017-10-13 DIAGNOSIS — F411 Generalized anxiety disorder: Secondary | ICD-10-CM

## 2017-10-20 ENCOUNTER — Ambulatory Visit: Payer: Self-pay | Admitting: Family Medicine

## 2017-10-27 ENCOUNTER — Ambulatory Visit: Payer: 59 | Admitting: Psychiatry

## 2017-10-29 LAB — TSH: TSH: 0.02 — AB (ref 0.41–5.90)

## 2017-10-29 LAB — CBC AND DIFFERENTIAL
HCT: 38 (ref 36–46)
Hemoglobin: 12.4 (ref 12.0–16.0)
Neutrophils Absolute: 2
Platelets: 223 (ref 150–399)
WBC: 2.8

## 2017-10-29 LAB — HEPATIC FUNCTION PANEL
ALT: 17 (ref 7–35)
AST: 25 (ref 13–35)
Alkaline Phosphatase: 75 (ref 25–125)
Bilirubin, Total: 0.3

## 2017-10-29 LAB — BASIC METABOLIC PANEL
BUN: 15 (ref 4–21)
Creatinine: 0.7 (ref 0.5–1.1)
Glucose: 97
Potassium: 4.6 (ref 3.4–5.3)
Sodium: 133 — AB (ref 137–147)

## 2017-10-29 LAB — VITAMIN D 25 HYDROXY (VIT D DEFICIENCY, FRACTURES): Vit D, 25-Hydroxy: 64.8

## 2017-10-29 LAB — VITAMIN B12: Vitamin B-12: >2000

## 2017-11-03 NOTE — Progress Notes (Addendum)
Mary Leach is a 67 y.o. female is here for follow up.  History of Present Illness:   HPI: Brings in labs from [place] to review. Feels great. Recently Armour increased and new Testosterone pellet. No side effects yet. We previously reviewed the risks of these treatments. Wants to tell her son to move out. Feels that she wants her house back. She asks how she should proceed since he is also my patient and I know the family dynamics. She has not spoken with her therapist yet.   Review of Systems  Constitutional: Negative for chills, fever, malaise/fatigue and weight loss.  Respiratory: Negative for cough, shortness of breath and wheezing.   Cardiovascular: Negative for chest pain, palpitations and leg swelling.  Gastrointestinal: Negative for abdominal pain, constipation, diarrhea, nausea and vomiting.  Genitourinary: Negative for dysuria and urgency.  Musculoskeletal: Negative for joint pain and myalgias.  Skin: Negative for rash.  Neurological: Negative for dizziness and headaches.  Psychiatric/Behavioral: Negative for depression, substance abuse and suicidal ideas. The patient is not nervous/anxious.    There are no preventive care reminders to display for this patient.   Depression screen [facility] 2/9 06/13/2017 03/17/2017  Decreased Interest 0 0  Down, Depressed, Hopeless 0 0  PHQ - 2 Score 0 0  Altered sleeping 0 -  Tired, decreased energy 0 -  Change in appetite 0 -  Feeling bad or failure about yourself  0 -  Trouble concentrating 2 -  Moving slowly or fidgety/restless 0 -  Suicidal thoughts 0 -  PHQ-9 Score 2 -   PMHx, SurgHx, SocialHx, FamHx, Medications, and Allergies were reviewed in the Visit Navigator and updated as appropriate.   Patient Active Problem List   Diagnosis Date Noted  . Hyponatremia 11/04/2017  . Dry scalp 09/28/2016  . Celiac sprue 07/02/2015  . Allergic rhinitis due to pollen 12/18/2014  . H/O resection of large bowel  12/15/2013  . 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 Asthma 10/18/2011  . 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 Smoking status: Never Smoker  . Smokeless tobacco: Never Used  Substance Use Topics  . Alcohol use: Yes    Alcohol/week: 1.8 oz    Types: 3 Standard drinks or equivalent per week    Comment: 1-3 glasses of wine a week or gluten free beer  . Drug use: No   Current Medications and Allergies:   Current Outpatient Medications:  .  ADVAIR DISKUS 100-50 MCG/DOSE AEPB, Inhale 1 puff into the lungs 2 (two) times daily. , Disp: , Rfl:  .  ARMOUR THYROID 120 MG tablet, Take 1 tablet by mouth daily., Disp: , Rfl:  .  Azelastine-Fluticasone (DYMISTA) 137-50 MCG/ACT SUSP, Place 1 spray into the nose daily., Disp: , Rfl:  .  Biotin 5000 MCG CAPS, Take by mouth., Disp: , Rfl:  .  cetirizine (ZYRTEC) 10 MG tablet, Take 10 mg by mouth daily., Disp: , Rfl:  .  CHELATED ZINC PO, Take 30 mg by mouth., Disp: , Rfl:  .  Cholecalciferol (VITAMIN D PO), Take 5,000 each by mouth daily. , Disp: , Rfl:  .  EPINEPHrine (EPI-PEN) 0.3 mg/0.3 mL DEVI, Inject 0.3 mg into the muscle once. For previous allergies, maybe gluten, Disp: , Rfl:  .  ESTROGENS CONJUGATED PO, Take by mouth., Disp: , Rfl:  .  FLUoxetine (PROZAC) 10 MG capsule, TAKE 3 CAPSULES DAILY  AS DIRECTED., Disp: 93 capsule, Rfl: 4 .  magnesium gluconate (MAGONATE) 500 MG tablet, Take 1,500 mg by mouth 2 (two) times daily., Disp: , Rfl:  .  Multiple Minerals-Vitamins (NUTRA-SUPPORT BONE) CAPS, Take 925 mg by mouth., Disp: , Rfl:  .  multivitamin-lutein (OCUVITE-LUTEIN) CAPS, Take 1 capsule by mouth daily., Disp: , Rfl:  .  Omega-3 Fatty Acids (SALMON OIL PO), Take 1,300 mg by mouth daily., Disp: , Rfl:  .  OVER THE COUNTER MEDICATION, , Disp: , Rfl:  .  Pregnenolone POWD, 75 mg by Does not apply route., Disp: , Rfl:  .  Probiotic Product  (PROBIOTIC DAILY PO), Take by mouth. metaflora IB, Disp: , Rfl:  .  Progesterone Micronized (PROGESTERONE PO), Take by mouth., Disp: , Rfl:  .  vitamin C (ASCORBIC ACID) 500 MG tablet, Take 500 mg by mouth daily., Disp: , Rfl:  .  VITAMIN K PO, Take 150 mcg by mouth daily., Disp: , Rfl:    Allergies  Allergen Reactions  . Augmentin [Amoxicillin-Pot Clavulanate] Diarrhea  . Other     Multiple food intolerances. Eggs, corn, potatoes, yeast, millett, buckwheat, sesame , soy.  Also observes a dairy and wheat free diet.  Marland Kitchen Prevnar [Pneumococcal 13-Val Conj Vacc]     Vaccine contains yeast   . Sulfonamide Derivatives Rash    Arms only.  . Tape Rash   Review of Systems   Pertinent items are noted in the HPI. Otherwise, ROS is negative.  Vitals:   Vitals:   11/04/17 1444  BP: 110/68  Pulse: 68  Temp: 98.6 F (37 C)  TempSrc: Oral  SpO2: 99%  Weight: 131 lb 3.2 oz (59.5 kg)  Height: 5' 3.75" (1.619 m)     Body mass index is 22.7 kg/m.  Physical Exam:   Physical Exam  Constitutional: She appears well-nourished.  HENT:  Head: Normocephalic and atraumatic.  Eyes: Pupils are equal, round, and reactive to light. EOM are normal.  Neck: Normal range of motion. Neck supple.  Cardiovascular: Normal rate, regular rhythm, normal heart sounds and intact distal pulses.  Pulmonary/Chest: Effort normal.  Abdominal: Soft.  Skin: Skin is warm.  Psychiatric: She has a normal mood and affect. Her behavior is normal.  Nursing note and vitals reviewed.   Assessment and Plan:   Mary Leach was seen today for follow-up.  Diagnoses and all orders for this visit:  Leukopenia, unspecified type Comments: On recent labs.  Hyponatremia Comments: Chronic but improved on latest labs.   Elevated testosterone level in female Comments: Due to exogenous hormone treatment by [facility] provider.  High risk medication use Comments: On Amour Thyroid and Testosterone through [facility].      . Reviewed expectations re: course of current medical issues. . Discussed self-management of symptoms. . Outlined signs and symptoms indicating need for more acute intervention. . Patient verbalized understanding and all questions were answered. Marland Kitchen Health Maintenance issues including appropriate healthy diet, exercise, and smoking avoidance were discussed with patient. . See orders for this visit as documented in the electronic medical record. . Patient received an After Visit Summary.  Briscoe Deutscher, DO , [facility] 11/06/2017  Future Appointments  Date Time Provider [place]  12/01/2017 11:00 AM Yaakov Guthrie, PsyD [facility] None  01/12/2018  2:30 PM Nunzio Cobbs, MD [place] None  05/08/2018  2:00 PM Briscoe Deutscher, DO [facility]

## 2017-11-04 ENCOUNTER — Ambulatory Visit: Payer: 59 | Admitting: Family Medicine

## 2017-11-04 ENCOUNTER — Encounter: Payer: Self-pay | Admitting: Family Medicine

## 2017-11-04 VITALS — BP 110/68 | HR 68 | Temp 98.6°F | Ht 63.75 in | Wt 131.2 lb

## 2017-11-04 DIAGNOSIS — R7989 Other specified abnormal findings of blood chemistry: Secondary | ICD-10-CM | POA: Diagnosis not present

## 2017-11-04 DIAGNOSIS — E871 Hypo-osmolality and hyponatremia: Secondary | ICD-10-CM

## 2017-11-04 DIAGNOSIS — D72819 Decreased white blood cell count, unspecified: Secondary | ICD-10-CM

## 2017-11-04 DIAGNOSIS — Z79899 Other long term (current) drug therapy: Secondary | ICD-10-CM

## 2017-11-06 ENCOUNTER — Encounter: Payer: Self-pay | Admitting: Family Medicine

## 2017-11-10 ENCOUNTER — Ambulatory Visit: Payer: Self-pay | Admitting: Psychiatry

## 2017-11-10 LAB — FSH/LH: FSH: 48.9

## 2017-11-15 NOTE — Progress Notes (Signed)
Mary Leach is a 67 y.o. female here for an acute visit.  History of Present Illness:   Mary Leach CMA acting as scribe for Dr. Juleen China.  HPI: Patient comes in today for an acute visit. Patient states that she has had sinus pressure, drainage in the back of the throat, and fatigue for over a week.   PMHx, SurgHx, SocialHx, Medications, and Allergies were reviewed in the Visit Navigator and updated as appropriate.  Current Medications:   Current Outpatient Medications:  .  ADVAIR DISKUS 100-50 MCG/DOSE AEPB, Inhale 1 puff into the lungs 2 (two) times daily. , Disp: , Rfl:  .  ARMOUR THYROID 120 MG tablet, Take 1 tablet by mouth daily., Disp: , Rfl:  .  Azelastine-Fluticasone (DYMISTA) 137-50 MCG/ACT SUSP, Place 1 spray into the nose daily., Disp: , Rfl:  .  Biotin 5000 MCG CAPS, Take by mouth., Disp: , Rfl:  .  cetirizine (ZYRTEC) 10 MG tablet, Take 10 mg by mouth daily., Disp: , Rfl:  .  CHELATED ZINC PO, Take 30 mg by mouth., Disp: , Rfl:  .  Cholecalciferol (VITAMIN D PO), Take 5,000 each by mouth daily. , Disp: , Rfl:  .  EPINEPHrine (EPI-PEN) 0.3 mg/0.3 mL DEVI, Inject 0.3 mg into the muscle once. For previous allergies, maybe gluten, Disp: , Rfl:  .  ESTROGENS CONJUGATED PO, Take by mouth., Disp: , Rfl:  .  FLUoxetine (PROZAC) 10 MG capsule, TAKE 3 CAPSULES DAILY AS DIRECTED., Disp: 93 capsule, Rfl: 4 .  magnesium gluconate (MAGONATE) 500 MG tablet, Take 1,500 mg by mouth 2 (two) times daily., Disp: , Rfl:  .  Multiple Minerals-Vitamins (NUTRA-SUPPORT BONE) CAPS, Take 925 mg by mouth., Disp: , Rfl:  .  multivitamin-lutein (OCUVITE-LUTEIN) CAPS, Take 1 capsule by mouth daily., Disp: , Rfl:  .  Omega-3 Fatty Acids (SALMON OIL PO), Take 1,300 mg by mouth daily., Disp: , Rfl:  .  OVER THE COUNTER MEDICATION, , Disp: , Rfl:  .  Pregnenolone POWD, 75 mg by Does not apply route., Disp: , Rfl:  .  Probiotic Product (PROBIOTIC DAILY PO), Take by mouth. metaflora IB, Disp: , Rfl:  .   Progesterone Micronized (PROGESTERONE PO), Take by mouth., Disp: , Rfl:  .  vitamin C (ASCORBIC ACID) 500 MG tablet, Take 500 mg by mouth daily., Disp: , Rfl:  .  VITAMIN K PO, Take 150 mcg by mouth daily., Disp: , Rfl:  .  levofloxacin (LEVAQUIN) 500 MG tablet, Take 1 tablet (500 mg total) by mouth daily., Disp: 10 tablet, Rfl: 0   Allergies  Allergen Reactions  . Augmentin [Amoxicillin-Pot Clavulanate] Diarrhea  . Other     Multiple food intolerances. Eggs, corn, potatoes, yeast, millett, buckwheat, sesame , soy.  Also observes a dairy and wheat free diet.  Marland Kitchen Prevnar [Pneumococcal 13-Val Conj Vacc]     Vaccine contains yeast   . Sulfonamide Derivatives Rash    Arms only.  . Tape Rash   Review of Systems:   Pertinent items are noted in the HPI. Otherwise, ROS is negative.  Vitals:   Vitals:   11/16/17 0946  BP: 104/62  Pulse: 73  Temp: 98.2 F (36.8 C)  TempSrc: Oral  SpO2: 100%  Weight: 130 lb (59 kg)  Height: 5' 3.75" (1.619 m)     Body mass index is 22.49 kg/m.  Physical Exam:   Physical Exam  Constitutional: She appears well-nourished.  HENT:  Head: Normocephalic and atraumatic.  Nose: Mucosal edema and  rhinorrhea present. Right sinus exhibits maxillary sinus tenderness and frontal sinus tenderness. Left sinus exhibits maxillary sinus tenderness and frontal sinus tenderness.  Eyes: Pupils are equal, round, and reactive to light. EOM are normal.  Neck: Normal range of motion. Neck supple.  Cardiovascular: Normal rate, regular rhythm, normal heart sounds and intact distal pulses.  Pulmonary/Chest: Effort normal.  Abdominal: Soft.  Skin: Skin is warm.  Psychiatric: She has a normal mood and affect. Her behavior is normal.  Nursing note and vitals reviewed.  Assessment and Plan:   Charvi was seen today for uri.  Diagnoses and all orders for this visit:  Acute non-recurrent maxillary sinusitis -     levofloxacin (LEVAQUIN) 500 MG tablet; Take 1 tablet (500 mg  total) by mouth daily.    . Reviewed expectations re: course of current medical issues. . Discussed self-management of symptoms. . Outlined signs and symptoms indicating need for more acute intervention. . Patient verbalized understanding and all questions were answered. Marland Kitchen Health Maintenance issues including appropriate healthy diet, exercise, and smoking avoidance were discussed with patient. . See orders for this visit as documented in the electronic medical record. . Patient received an After Visit Summary.  CMA served as Education administrator during this visit. History, Physical, and Plan performed by medical provider. The above documentation has been reviewed and is accurate and complete. Briscoe Deutscher, D.O.   Briscoe Deutscher, DO [place], [facility] 11/16/2017

## 2017-11-16 ENCOUNTER — Encounter: Payer: Self-pay | Admitting: Family Medicine

## 2017-11-16 ENCOUNTER — Ambulatory Visit: Payer: 59 | Admitting: Family Medicine

## 2017-11-16 VITALS — BP 104/62 | HR 73 | Temp 98.2°F | Ht 63.75 in | Wt 130.0 lb

## 2017-11-16 DIAGNOSIS — J0100 Acute maxillary sinusitis, unspecified: Secondary | ICD-10-CM | POA: Diagnosis not present

## 2017-11-16 MED ORDER — LEVOFLOXACIN 500 MG PO TABS: 500.0000 mg | ORAL_TABLET | Freq: Every day | ORAL | 0 refills | Status: DC

## 2017-12-01 ENCOUNTER — Ambulatory Visit (INDEPENDENT_AMBULATORY_CARE_PROVIDER_SITE_OTHER): Payer: 59 | Admitting: Psychiatry

## 2017-12-01 DIAGNOSIS — F411 Generalized anxiety disorder: Secondary | ICD-10-CM

## 2017-12-06 ENCOUNTER — Other Ambulatory Visit: Payer: Self-pay | Admitting: Family Medicine

## 2017-12-08 ENCOUNTER — Ambulatory Visit (INDEPENDENT_AMBULATORY_CARE_PROVIDER_SITE_OTHER): Payer: 59 | Admitting: Psychiatry

## 2017-12-08 DIAGNOSIS — F411 Generalized anxiety disorder: Secondary | ICD-10-CM

## 2017-12-15 ENCOUNTER — Ambulatory Visit (INDEPENDENT_AMBULATORY_CARE_PROVIDER_SITE_OTHER): Payer: 59 | Admitting: Psychiatry

## 2017-12-15 DIAGNOSIS — F411 Generalized anxiety disorder: Secondary | ICD-10-CM | POA: Diagnosis not present

## 2017-12-22 ENCOUNTER — Ambulatory Visit (INDEPENDENT_AMBULATORY_CARE_PROVIDER_SITE_OTHER): Payer: 59 | Admitting: Psychiatry

## 2017-12-22 DIAGNOSIS — F411 Generalized anxiety disorder: Secondary | ICD-10-CM

## 2018-01-05 ENCOUNTER — Ambulatory Visit: Payer: 59 | Admitting: Psychiatry

## 2018-01-12 ENCOUNTER — Other Ambulatory Visit: Payer: Self-pay

## 2018-01-12 ENCOUNTER — Ambulatory Visit (INDEPENDENT_AMBULATORY_CARE_PROVIDER_SITE_OTHER): Payer: Medicare Other | Admitting: Obstetrics and Gynecology

## 2018-01-12 ENCOUNTER — Ambulatory Visit: Payer: 59 | Admitting: Obstetrics and Gynecology

## 2018-01-12 ENCOUNTER — Other Ambulatory Visit (HOSPITAL_COMMUNITY)
Admission: RE | Admit: 2018-01-12 | Discharge: 2018-01-12 | Disposition: A | Discharge status: Home / Self Care | Payer: Medicare Other | Source: Ambulatory Visit | Attending: Obstetrics and Gynecology | Admitting: Obstetrics and Gynecology

## 2018-01-12 ENCOUNTER — Encounter: Payer: Self-pay | Admitting: Obstetrics and Gynecology

## 2018-01-12 VITALS — BP 112/70 | HR 72 | Resp 16 | Ht 63.75 in | Wt 132.0 lb

## 2018-01-12 DIAGNOSIS — Z124 Encounter for screening for malignant neoplasm of cervix: Secondary | ICD-10-CM

## 2018-01-12 DIAGNOSIS — N760 Acute vaginitis: Secondary | ICD-10-CM | POA: Insufficient documentation

## 2018-01-12 DIAGNOSIS — Z01419 Encounter for gynecological examination (general) (routine) without abnormal findings: Secondary | ICD-10-CM

## 2018-01-12 DIAGNOSIS — Z01411 Encounter for gynecological examination (general) (routine) with abnormal findings: Secondary | ICD-10-CM | POA: Diagnosis not present

## 2018-01-12 NOTE — Progress Notes (Signed)
67 y.o. G2P2 Married Caucasian female here for annual exam.    On HRT through [facility].  Doing estrogen and testosterone pellets.  Taking oral progesterone.  Taking vit D and bone support also.   No vaginal bleeding.   Vaginal discharge and itching.  Self treated with Monistat and completed tx one week ago.  Doing well health wise.   Just sorted through her mother's belongings.  Recent trip to [place].    PCP: Briscoe Deutscher, DO    Patient's last menstrual period was 05/24/2006.           Sexually active: No.  The current method of family planning is tubal ligation and postmenopausal.    Exercising: Yes.    aerobics and yoga Smoker:  no  Health Maintenance: Pap:  12/26/14 Pap and HR HPV negative History of abnormal Pap:  no MMG:  09/27/17 BIRADS 1 negative/density b Colonoscopy:  12/08/15 - 1 polyp. Dr. Collene Mares. Due again in 2022 BMD:   03/15/17 - osteoporosis. TDaP:  06/26/09 Gardasil:   n/a HIV and Hep C: 04/06/06 Negative Screening Labs:  PCP   reports that she has never smoked. She has never used smokeless tobacco. She reports that she drinks about 3.0 standard drinks of alcohol per week. She reports that she does not use drugs.  Past Medical History:  Diagnosis Date  . Allergic rhinitis   . Anxiety   . Asthma   . Bipolar disorder ([place])   . Celiac disease   . Depression   . History of colonic polyps   . Ileus following gastrointestinal surgery ([place]) 10/18/2011  . Malnutrition following gastrointestinal surgery 10/18/2011  . Osteoporosis 2018  . RBBB (right bundle branch block)     Past Surgical History:  Procedure Laterality Date  . COLON SURGERY    . COLONOSCOPY W/ POLYPECTOMY    . COLOSTOMY TAKEDOWN  05/29/2012   Procedure: LAPAROSCOPIC COLOSTOMY TAKEDOWN;  Surgeon: Odis Hollingshead, MD;  Location: [facility];  Service: General;  Laterality: N/A;  Laparoscopic Assisted Colostomy Closure  with proctoscopy  . CYSTECTOMY     LEFT FOOT, RIGHT HAND  . FOOT  SURGERY    . LAPAROTOMY  10/07/2011   Procedure: EXPLORATORY LAPAROTOMY;  Surgeon: Odis Hollingshead, MD;  Location: [facility];  Service: General;  Laterality: N/A;  hartmann procedure and creation of colostomy  . NASAL SEPTOPLASTY W/ TURBINOPLASTY Bilateral 11/11/2015   Procedure: SEPTOPLASTY BILATERAL TURBINATE  REDUCTION ;  Surgeon: Leta Baptist, MD;  Location: [place];  Service: ENT;  Laterality: Bilateral;  . NASAL SINUS SURGERY Bilateral 11/11/2015   Procedure: BILATERAL ENDOSCOPIC  MAXILLARY ANTROSTOMY;  Surgeon: Leta Baptist, MD;  Location: [place];  Service: ENT;  Laterality: Bilateral;  . TONSILLECTOMY AND ADENOIDECTOMY    . TUBAL LIGATION    . UPPER GASTROINTESTINAL ENDOSCOPY  01/19/12    Current Outpatient Medications  Medication Sig Dispense Refill  . ADVAIR DISKUS 100-50 MCG/DOSE AEPB Inhale 1 puff into the lungs 2 (two) times daily.     Francia Greaves THYROID 120 MG tablet Take 1 tablet by mouth daily.    . Azelastine-Fluticasone (DYMISTA) 137-50 MCG/ACT SUSP Place 1 spray into the nose daily.    . Biotin 5000 MCG CAPS Take by mouth.    . cetirizine (ZYRTEC) 10 MG tablet Take 10 mg by mouth daily.    . CHELATED ZINC PO Take 30 mg by mouth.    . Cholecalciferol (VITAMIN D PO) Take 5,000 each by mouth  daily.     Marland Kitchen EPINEPHrine (EPI-PEN) 0.3 mg/0.3 mL DEVI Inject 0.3 mg into the muscle once. For previous allergies, maybe gluten    . ESTROGENS CONJUGATED PO Take by mouth.    Marland Kitchen FLUoxetine (PROZAC) 10 MG capsule TAKE 3 CAPSULES DAILY AS DIRECTED. 93 capsule 1  . magnesium gluconate (MAGONATE) 500 MG tablet Take 1,500 mg by mouth 2 (two) times daily.    . Multiple Minerals-Vitamins (NUTRA-SUPPORT BONE) CAPS Take 925 mg by mouth.    . multivitamin-lutein (OCUVITE-LUTEIN) CAPS Take 1 capsule by mouth daily.    . NON FORMULARY Methylation Complete - take one tablet by mouth daily    . Nutritional Supplements (DHEA PO) Take 5 mg by mouth daily.    . Omega-3 Fatty Acids  (SALMON OIL PO) Take 1,300 mg by mouth daily.    Marland Kitchen OVER THE COUNTER MEDICATION     . Pregnenolone POWD 75 mg by Does not apply route.    . Probiotic Product (PROBIOTIC DAILY PO) Take by mouth. metaflora IB    . Progesterone Micronized (PROGESTERONE PO) Take 300 mg by mouth daily.     . vitamin C (ASCORBIC ACID) 500 MG tablet Take 500 mg by mouth daily.    Marland Kitchen VITAMIN K PO Take 150 mcg by mouth daily.     No current facility-administered medications for this visit.     Family History  Problem Relation Age of Onset  . Heart disease Father   . Osteoporosis Father   . Osteoporosis Mother   . Mental illness Paternal Aunt   . Skin cancer Maternal Grandmother   . Hypertension Maternal Grandfather   . Heart disease Paternal Grandfather   . Celiac disease Grandchild     Review of Systems  Genitourinary:       Abnormal discharge Vulvar itching  All other systems reviewed and are negative.   Exam:   BP 112/70 (BP Location: Right Arm, Patient Position: Sitting, Cuff Size: Normal)   Pulse 72   Resp 16   Ht 5' 3.75" (1.619 m)   Wt 132 lb (59.9 kg)   LMP 05/24/2006   BMI 22.84 kg/m     General appearance: alert, cooperative and appears stated age Head: Normocephalic, without obvious abnormality, atraumatic Neck: no adenopathy, supple, symmetrical, trachea midline and thyroid normal to inspection and palpation Lungs: clear to auscultation bilaterally Breasts: normal appearance, no masses or tenderness, No nipple retraction or dimpling, No nipple discharge or bleeding, No axillary or supraclavicular adenopathy Heart: regular rate and rhythm Abdomen: soft, non-tender; no masses, no organomegaly Extremities: extremities normal, atraumatic, no cyanosis or edema Skin: Skin color, texture, turgor normal. No rashes or lesions Lymph nodes: Cervical, supraclavicular, and axillary nodes normal. No abnormal inguinal nodes palpated Neurologic: Grossly normal  Pelvic: External genitalia:  no  lesions              Urethra:  normal appearing urethra with no masses, tenderness or lesions              Bartholins and Skenes: normal                 Vagina: normal appearing vagina with normal color and discharge, no lesions              Cervix: no lesions.  Large ectropion.               Pap taken: Yes.   Bimanual Exam:  Uterus:  normal size, contour, position, consistency, mobility, non-tender  Adnexa: no mass, fullness, tenderness              Rectal exam: Yes.  .  Confirms.              Anus:  normal sphincter tone, no lesions  Chaperone was present for exam.  Assessment:   Well woman visit with normal exam. Vaginitis.  HRT from [facility] [place].  Celiac Asthma.  Osteoporosis of forearm, osteopenia otherwise.   Plan: Mammogram screening. Recommended self breast awareness. Pap and HR HPV as above. Guidelines for Calcium, Vitamin D, regular exercise program including cardiovascular and weight bearing exercise. Vaginitis testing from pap.  BMD next year.  Follow up annually and prn.   After visit summary provided.

## 2018-01-12 NOTE — Patient Instructions (Addendum)

## 2018-01-16 LAB — CYTOLOGY - PAP
Bacterial vaginitis: NEGATIVE
Candida vaginitis: NEGATIVE
Diagnosis: NEGATIVE
Trichomonas: NEGATIVE

## 2018-01-18 ENCOUNTER — Emergency Department (HOSPITAL_COMMUNITY): Payer: Medicare Other

## 2018-01-18 ENCOUNTER — Encounter (HOSPITAL_COMMUNITY): Payer: Self-pay

## 2018-01-18 ENCOUNTER — Other Ambulatory Visit: Payer: Self-pay

## 2018-01-18 ENCOUNTER — Emergency Department (HOSPITAL_COMMUNITY)
Admission: EM | Admit: 2018-01-18 | Discharge: 2018-01-18 | Disposition: A | Discharge status: Home / Self Care | Payer: Medicare Other | Attending: Emergency Medicine | Admitting: Emergency Medicine

## 2018-01-18 DIAGNOSIS — Z79899 Other long term (current) drug therapy: Secondary | ICD-10-CM | POA: Diagnosis not present

## 2018-01-18 DIAGNOSIS — J45909 Unspecified asthma, uncomplicated: Secondary | ICD-10-CM | POA: Insufficient documentation

## 2018-01-18 DIAGNOSIS — R1032 Left lower quadrant pain: Secondary | ICD-10-CM | POA: Diagnosis present

## 2018-01-18 LAB — URINALYSIS, ROUTINE W REFLEX MICROSCOPIC
Bilirubin Urine: NEGATIVE
Glucose, UA: NEGATIVE mg/dL
Hgb urine dipstick: NEGATIVE
Ketones, ur: 20 mg/dL — AB
Leukocytes, UA: NEGATIVE
Nitrite: NEGATIVE
Protein, ur: NEGATIVE mg/dL
Specific Gravity, Urine: 1.01 (ref 1.005–1.030)
pH: 9 — ABNORMAL HIGH (ref 5.0–8.0)

## 2018-01-18 LAB — COMPREHENSIVE METABOLIC PANEL
ALT: 21 U/L (ref 0–44)
AST: 28 U/L (ref 15–41)
Albumin: 4.1 g/dL (ref 3.5–5.0)
Alkaline Phosphatase: 78 U/L (ref 38–126)
Anion gap: 10 (ref 5–15)
BUN: 13 mg/dL (ref 8–23)
CO2: 25 mmol/L (ref 22–32)
Calcium: 8.8 mg/dL — ABNORMAL LOW (ref 8.9–10.3)
Chloride: 102 mmol/L (ref 98–111)
Creatinine, Ser: 0.57 mg/dL (ref 0.44–1.00)
GFR calc Af Amer: >60 mL/min (ref >60)
GFR calc non Af Amer: >60 mL/min (ref >60)
Glucose, Bld: 104 mg/dL — ABNORMAL HIGH (ref 70–99)
Potassium: 3.8 mmol/L (ref 3.5–5.1)
Sodium: 137 mmol/L (ref 135–145)
Total Bilirubin: 0.7 mg/dL (ref 0.3–1.2)
Total Protein: 7 g/dL (ref 6.5–8.1)

## 2018-01-18 LAB — CBC WITH DIFFERENTIAL/PLATELET
Basophils Absolute: 0 10*3/uL (ref 0.0–0.1)
Basophils Relative: 0 %
Eosinophils Absolute: 0 10*3/uL (ref 0.0–0.7)
Eosinophils Relative: 0 %
HCT: 39.9 % (ref 36.0–46.0)
Hemoglobin: 13.7 g/dL (ref 12.0–15.0)
Lymphocytes Relative: 8 %
Lymphs Abs: 0.6 10*3/uL — ABNORMAL LOW (ref 0.7–4.0)
MCH: 33.3 pg (ref 26.0–34.0)
MCHC: 34.3 g/dL (ref 30.0–36.0)
MCV: 96.8 fL (ref 78.0–100.0)
Monocytes Absolute: 0.1 10*3/uL (ref 0.1–1.0)
Monocytes Relative: 2 %
Neutro Abs: 7 10*3/uL (ref 1.7–7.7)
Neutrophils Relative %: 90 %
Platelets: 228 10*3/uL (ref 150–400)
RBC: 4.12 MIL/uL (ref 3.87–5.11)
RDW: 13 % (ref 11.5–15.5)
WBC: 7.8 10*3/uL (ref 4.0–10.5)

## 2018-01-18 MED ORDER — IOPAMIDOL (ISOVUE-300) INJECTION 61%
INTRAVENOUS | Status: AC
  Filled 2018-01-18: qty 100

## 2018-01-18 MED ORDER — HYDROCODONE-ACETAMINOPHEN 5-325 MG PO TABS: 1.0000 | ORAL_TABLET | Freq: Four times a day (QID) | ORAL | 0 refills | Status: DC | PRN

## 2018-01-18 MED ORDER — IOPAMIDOL (ISOVUE-300) INJECTION 61%
100.0000 mL | Freq: Once | INTRAVENOUS | Status: AC | PRN
  Administered 2018-01-18: 100 mL via INTRAVENOUS

## 2018-01-18 MED ORDER — ONDANSETRON HCL 4 MG PO TABS: 4.0000 mg | ORAL_TABLET | Freq: Three times a day (TID) | ORAL | 0 refills | Status: DC | PRN

## 2018-01-18 MED ORDER — TAMSULOSIN HCL 0.4 MG PO CAPS: 0.4000 mg | ORAL_CAPSULE | Freq: Every day | ORAL | 0 refills | Status: DC

## 2018-01-18 NOTE — ED Provider Notes (Signed)
  Physical Exam  BP (!) 98/51   Pulse 66   Temp 98 F (36.7 C) (Oral)   Resp 18   LMP 05/24/2006   SpO2 100%   Physical Exam  Gen: appears nontoxic Abs: mild TTP of LLQ, no tenderness elsewhere.    ED Course/Procedures       MDM   Patient signed out to me by Alvera Singh, PA-C.  Please see previous notes for further history.  In brief, patient presenting with acute onset left lower quadrant abdominal pain which was sharp and severe.  Began this morning.  History of colon resection due to proximal rectum perforation after fecal impaction in 2013.  Additional abdominal history of celiac's disease.  Patient had one episode of vomiting, no further nausea or vomiting.  Pain is been controlled while in the ER.  Labs reassuring.  CT pending for further evaluation.  T shows no intra-abdominal infection.  Shows left-sided kidney stone.  On reassessment, patient remains nontoxic with mild left lower quadrant tenderness without severe pain as she had earlier. Discussed with pt. discussed symptom medic treatment with pain and nausea control.  Importance of hydration.  Follow-up with PCP/urology as needed.  Strict return precautions given.  At this time, patient appears safe for discharge.  Return precautions given.  Patient states she understands and agrees to plan.     Franchot Heidelberg, PA-C 01/18/18 1729    Tegeler, Gwenyth Allegra, MD 01/19/18 [phone]

## 2018-01-18 NOTE — ED Notes (Signed)
Bed: SU01 Expected date:  Expected time:  Means of arrival:  Comments: EMS/abd. pain

## 2018-01-18 NOTE — ED Notes (Signed)
EDPA Provider at bedside. 

## 2018-01-18 NOTE — Discharge Instructions (Signed)
Take flomax daily until symptoms improve.  Take ibuprofen or aleve as needed for mild to moderate pain.  Use norco as needed for severe pain. Do not drive while taking this medication.  Make sure you stay well hydrated with water, your urine should be clear to pale yellow.  Follow up with your primary care doctor or the urologist as needed for further evaluation.  Return to the ER if you develop fevers, persistent vomiting, severe/uncontrolled pain, inability to urinate, or any new or concerning symptoms.

## 2018-01-18 NOTE — ED Provider Notes (Signed)
Medical screening examination/treatment/procedure(s) were conducted as a shared visit with non-physician practitioner(s) and myself.  I personally evaluated the patient during the encounter.  None 67 year old female presents with acute onset of left lower abdominal pain that began just prior to arrival.  Pain is been colicky and is since resolved.  Abdominal exam is without surgical findings.  Labs abdominal CT is pending.   Lacretia Leigh, MD 01/18/18 1426

## 2018-01-18 NOTE — ED Triage Notes (Signed)
Pt. States she had sudden onset of llq abd. Pain at ~0930 today. She also felt an urge to have a b.m. And was able, with some difficulty to "had a real small one". She also states she vomited x 1. She arrives in no distress. She states she has hx of "colon surgery with Dr. Zella Richer."

## 2018-01-18 NOTE — ED Provider Notes (Signed)
[facility] DEPT Provider Note   CSN: [id number] Arrival date & time: 01/18/18  1234     History   Chief Complaint Chief Complaint  Patient presents with  . Abdominal Pain    HPI Mary Leach is a 67 y.o. female.  Patient with a history of colon resection due to proximal rectum perforation secondary to fecal impaction in 09/2011 --presents with acute onset of left lower quadrant abdominal pain which started abruptly at 9:30 AM today.  She had the urge to have a bowel movement but did not pass much stool.  She does not report passing any gas.  Symptoms gradually worsened and she had an episode of vomiting.  She called EMS who transported her to the hospital.  During EMS ride, her symptoms.  Pain is now mild except for when you push over the area.  She denies any urinary symptoms.  No fevers, chest pain or shortness of breath.  Patient denies a history of diverticulitis.  Zofran given for nausea.  No other treatments prior to arrival.       Past Medical History:  Diagnosis Date  . Allergic rhinitis   . Anxiety   . Asthma   . Bipolar disorder ([place])   . Celiac disease   . Depression   . History of colonic polyps   . Ileus following gastrointestinal surgery ([place]) 10/18/2011  . Malnutrition following gastrointestinal surgery 10/18/2011  . Osteoporosis 2018  . RBBB (right bundle branch block)     Patient Active Problem List   Diagnosis Date Noted  . Hyponatremia 11/04/2017  . Dry scalp 09/28/2016  . Celiac sprue 07/02/2015  . Allergic rhinitis due to pollen 12/18/2014  . H/O resection of large bowel 12/15/2013  . 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 Asthma 10/18/2011  . Rectal Perforation due to fecal impaction s/p Hartmann procedure 5/16 10/08/2011    Past Surgical History:  Procedure Laterality Date  . COLON SURGERY    . COLONOSCOPY W/ POLYPECTOMY    . COLOSTOMY TAKEDOWN  05/29/2012   Procedure: LAPAROSCOPIC  COLOSTOMY TAKEDOWN;  Surgeon: Odis Hollingshead, MD;  [facility];  Service: General;  Laterality: N/A;  Laparoscopic Assisted Colostomy Closure  with proctoscopy  . CYSTECTOMY     LEFT FOOT, RIGHT HAND  . FOOT SURGERY    . LAPAROTOMY  10/07/2011   Procedure: EXPLORATORY LAPAROTOMY;  Surgeon: Odis Hollingshead, MD;  [facility];  Service: General;  Laterality: N/A;  hartmann procedure and creation of colostomy  . NASAL SEPTOPLASTY W/ TURBINOPLASTY Bilateral 11/11/2015   Procedure: SEPTOPLASTY BILATERAL TURBINATE  REDUCTION ;  Surgeon: Leta Baptist, MD;  Location: [place];  Service: ENT;  Laterality: Bilateral;  . NASAL SINUS SURGERY Bilateral 11/11/2015   Procedure: BILATERAL ENDOSCOPIC  MAXILLARY ANTROSTOMY;  Surgeon: Leta Baptist, MD;  Location: [place];  Service: ENT;  Laterality: Bilateral;  . TONSILLECTOMY AND ADENOIDECTOMY    . TUBAL LIGATION    . UPPER GASTROINTESTINAL ENDOSCOPY  01/19/12     OB History    Gravida  2   Para  2   Term      Preterm      AB      Living  2     SAB      TAB      Ectopic      Multiple      Live Births  Home Medications    Prior to Admission medications   Medication Sig Start Date End Date Taking? Authorizing Provider  ADVAIR DISKUS 100-50 MCG/DOSE AEPB Inhale 1 puff into the lungs 2 (two) times daily.  12/12/13   [provider]  ARMOUR THYROID 120 MG tablet Take 1 tablet by mouth daily. 10/18/17   [provider]  Azelastine-Fluticasone (DYMISTA) 137-50 MCG/ACT SUSP Place 1 spray into the nose daily.    [provider]  Biotin 5000 MCG CAPS Take by mouth.    [provider]  cetirizine (ZYRTEC) 10 MG tablet Take 10 mg by mouth daily.    [provider]  CHELATED ZINC PO Take 30 mg by mouth.    [provider]  Cholecalciferol (VITAMIN D PO) Take 5,000 each by mouth daily.     [provider]  EPINEPHrine (EPI-PEN) 0.3  mg/0.3 mL DEVI Inject 0.3 mg into the muscle once. For previous allergies, maybe gluten    [provider]  ESTROGENS CONJUGATED PO Take by mouth.    [provider]  FLUoxetine (PROZAC) 10 MG capsule TAKE 3 CAPSULES DAILY AS DIRECTED. 12/06/17   Briscoe Deutscher, DO  magnesium gluconate (MAGONATE) 500 MG tablet Take 1,500 mg by mouth 2 (two) times daily.    [provider]  Multiple Minerals-Vitamins (NUTRA-SUPPORT BONE) CAPS Take 925 mg by mouth.    [provider]  multivitamin-lutein (OCUVITE-LUTEIN) CAPS Take 1 capsule by mouth daily.    [provider]  NON FORMULARY Methylation Complete - take one tablet by mouth daily    [provider]  Nutritional Supplements (DHEA PO) Take 5 mg by mouth daily.    [provider]  Omega-3 Fatty Acids (SALMON OIL PO) Take 1,300 mg by mouth daily.    [provider]  OVER THE COUNTER MEDICATION     [provider]  Pregnenolone POWD 75 mg by Does not apply route.    [provider]  Probiotic Product (PROBIOTIC DAILY PO) Take by mouth. metaflora IB    [provider]  Progesterone Micronized (PROGESTERONE PO) Take 300 mg by mouth daily.     [provider]  vitamin C (ASCORBIC ACID) 500 MG tablet Take 500 mg by mouth daily.    [provider]  VITAMIN K PO Take 150 mcg by mouth daily.    [provider]    Family History Family History  Problem Relation Age of Onset  . Heart disease Father   . Osteoporosis Father   . Osteoporosis Mother   . Mental illness Paternal Aunt   . Skin cancer Maternal Grandmother   . Hypertension Maternal Grandfather   . Heart disease Paternal Grandfather   . Celiac disease Grandchild     Social History Social History   Tobacco Use  . Smoking status: Never Smoker  . Smokeless tobacco: Never Used  Substance Use Topics  . Alcohol use: Yes    Alcohol/week: 3.0 standard drinks    Types: 3  Standard drinks or equivalent per week    Comment: 1-3 glasses of wine a week or gluten free beer  . Drug use: No     Allergies   Augmentin [amoxicillin-pot clavulanate]; Other; Prevnar [pneumococcal 13-val conj vacc]; Sulfonamide derivatives; and Tape   Review of Systems Review of Systems  Constitutional: Negative for fever.  HENT: Negative for rhinorrhea and sore throat.   Eyes: Negative for redness.  Respiratory: Negative for cough.   Cardiovascular: Negative for chest  pain.  Gastrointestinal: Positive for abdominal pain, nausea and vomiting. Negative for blood in stool and diarrhea.  Genitourinary: Negative for dysuria.  Musculoskeletal: Negative for myalgias.  Skin: Negative for rash.  Neurological: Negative for headaches.     Physical Exam Updated Vital Signs BP (!) 141/96 (BP Location: Right Arm)   Pulse 66   Temp 98 F (36.7 C) (Oral)   Resp 16   LMP 05/24/2006   SpO2 100%   Physical Exam  Constitutional: She appears well-developed and well-nourished.  HENT:  Head: Normocephalic and atraumatic.  Eyes: Conjunctivae are normal. Right eye exhibits no discharge. Left eye exhibits no discharge.  Neck: Normal range of motion. Neck supple.  Cardiovascular: Normal rate, regular rhythm and normal heart sounds.  Pulmonary/Chest: Effort normal and breath sounds normal.  Abdominal: Soft. There is tenderness in the left lower quadrant. There is no rebound and no guarding.  Neurological: She is alert.  Skin: Skin is warm and dry.  Psychiatric: She has a normal mood and affect.  Nursing note and vitals reviewed.    ED Treatments / Results  Labs (all labs ordered are listed, but only abnormal results are displayed) Labs Reviewed  CBC WITH DIFFERENTIAL/PLATELET - Abnormal; Notable for the following components:      Result Value   Lymphs Abs 0.6 (*)    All other components within normal limits  COMPREHENSIVE METABOLIC PANEL - Abnormal; Notable for the following  components:   Glucose, Bld 104 (*)    Calcium 8.8 (*)    All other components within normal limits  URINALYSIS, ROUTINE W REFLEX MICROSCOPIC - Abnormal; Notable for the following components:   pH 9.0 (*)    Ketones, ur 20 (*)    All other components within normal limits    EKG None  Radiology No results found.  Procedures Procedures (including critical care time)  Medications Ordered in ED Medications - No data to display   Initial Impression / Assessment and Plan / ED Course  I have reviewed the triage vital signs and the nursing notes.  Pertinent labs & imaging results that were available during my care of the patient were reviewed by me and considered in my medical decision making (see chart for details).     Patient seen and examined. Work-up initiated.  Pain is improving, however patient is still tender in the left lower quadrant and given her surgical history, feel that CT imaging should be performed to rule out emergent etiology of pain.  Labs and urine ordered.  Patient does not currently require any pain medication. NPO status discussed.   Vital signs reviewed and are as follows: BP (!) 141/96 (BP Location: Right Arm)   Pulse 66   Temp 98 F (36.7 C) (Oral)   Resp 16   LMP 05/24/2006   SpO2 100%    4:03 PM labs reassuring.  Pt seen earlier by Dr. Zenia Resides.  Patient continuing to pend abdominal CT.  Handoff to [place] PA-C at shift change.   Final Clinical Impressions(s) / ED Diagnoses   Final diagnoses:  Abdominal pain, acute, left lower quadrant   Pending CT.   ED Discharge Orders    None       Carlisle Cater, Hershal Coria 01/18/18 1605    Lacretia Leigh, MD 01/20/18 1705

## 2018-01-19 ENCOUNTER — Ambulatory Visit: Payer: Self-pay | Admitting: Psychiatry

## 2018-02-02 ENCOUNTER — Ambulatory Visit: Payer: 59 | Admitting: Psychiatry

## 2018-02-06 ENCOUNTER — Other Ambulatory Visit: Payer: Self-pay | Admitting: Family Medicine

## 2018-02-10 ENCOUNTER — Encounter: Payer: Self-pay | Admitting: Physician Assistant

## 2018-02-10 ENCOUNTER — Ambulatory Visit (INDEPENDENT_AMBULATORY_CARE_PROVIDER_SITE_OTHER): Payer: Medicare Other | Admitting: Physician Assistant

## 2018-02-10 VITALS — BP 100/60 | HR 63 | Temp 98.6°F | Ht 63.75 in | Wt 130.2 lb

## 2018-02-10 DIAGNOSIS — J0100 Acute maxillary sinusitis, unspecified: Secondary | ICD-10-CM | POA: Diagnosis not present

## 2018-02-10 MED ORDER — DOXYCYCLINE HYCLATE 100 MG PO TABS: 100.0000 mg | ORAL_TABLET | Freq: Two times a day (BID) | ORAL | 0 refills | Status: DC

## 2018-02-10 NOTE — Patient Instructions (Signed)
It was great to see you!  Use medication as prescribed: Doxycycline  Push fluids and get plenty of rest. Please return if you are not improving as expected, or if you have high fevers (>101.5) or difficulty swallowing or worsening productive cough.  Call clinic with questions.  I hope you start feeling better soon!

## 2018-02-10 NOTE — Progress Notes (Signed)
Mary Leach is a 67 y.o. female here for a new problem.  History of Present Illness:   Chief Complaint  Patient presents with  . Sinus Problem    Sinus Problem  This is a new problem. Episode onset: Started 2 weeks ago was at a convention in [place]. The problem has been gradually worsening since onset. There has been no fever. Her pain is at a severity of 2/10. The pain is mild. Associated symptoms include congestion (Nasal, ), a hoarse voice and sinus pressure. Pertinent negatives include no chills, shortness of breath or sore throat. (Facial pain) Treatments tried: Zyrtec. The treatment provided no relief.   Significant PMH of asthma. She states that her husband is being treated for sinusitis. Using her advair as scheduled. Has not required use of albuterol.  Denies CP and SOB.  Past Medical History:  Diagnosis Date  . Allergic rhinitis   . Anxiety   . Asthma   . Bipolar disorder ([place])   . Celiac disease   . Depression   . History of colonic polyps   . Ileus following gastrointestinal surgery ([place]) 10/18/2011  . Malnutrition following gastrointestinal surgery 10/18/2011  . Osteoporosis 2018  . RBBB (right bundle branch block)      Social History   Socioeconomic History  . Marital status: Married    Spouse name: Not on file  . Number of children: Not on file  . Years of education: Not on file  . Highest education level: Not on file  Occupational History    Employer: [employer]  . Financial resource strain: Not on file  . Food insecurity:    Worry: Not on file    Inability: Not on file  . Transportation needs:    Medical: Not on file    Non-medical: Not on file  Tobacco Use  . Smoking status: Never Smoker  . Smokeless tobacco: Never Used  Substance and Sexual Activity  . Alcohol use: Yes    Alcohol/week: 3.0 standard drinks    Types: 3 Standard drinks or equivalent per week    Comment: 1-3 glasses of wine a week or gluten free beer  .  Drug use: No  . Sexual activity: Not Currently    Partners: Male    Birth control/protection: Post-menopausal  Lifestyle  . Physical activity:    Days per week: Not on file    Minutes per session: Not on file  . Stress: Not on file  Relationships  . Social connections:    Talks on phone: Not on file    Gets together: Not on file    Attends religious service: Not on file    Active member of club or organization: Not on file    Attends meetings of clubs or organizations: Not on file    Relationship status: Not on file  . Intimate partner violence:    Fear of current or ex partner: Not on file    Emotionally abused: Not on file    Physically abused: Not on file    Forced sexual activity: Not on file  Other Topics Concern  . Not on file  Social History Narrative   Married, lives with husband and son Jenny Reichmann.  [employer] ([employer]).  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    Past Surgical History:  Procedure Laterality Date  . COLON SURGERY    . COLONOSCOPY W/ POLYPECTOMY    . COLOSTOMY TAKEDOWN  05/29/2012   Procedure: LAPAROSCOPIC COLOSTOMY TAKEDOWN;  Surgeon: Sherren Mocha  Adalberto Cole, MD;  Location: [facility];  Service: General;  Laterality: N/A;  Laparoscopic Assisted Colostomy Closure  with proctoscopy  . CYSTECTOMY     LEFT FOOT, RIGHT HAND  . FOOT SURGERY    . LAPAROTOMY  10/07/2011   Procedure: EXPLORATORY LAPAROTOMY;  Surgeon: Odis Hollingshead, MD;  Location: [facility];  Service: General;  Laterality: N/A;  hartmann procedure and creation of colostomy  . NASAL SEPTOPLASTY W/ TURBINOPLASTY Bilateral 11/11/2015   Procedure: SEPTOPLASTY BILATERAL TURBINATE  REDUCTION ;  Surgeon: Leta Baptist, MD;  Location: [facility];  Service: ENT;  Laterality: Bilateral;  . NASAL SINUS SURGERY Bilateral 11/11/2015   Procedure: BILATERAL ENDOSCOPIC  MAXILLARY ANTROSTOMY;  Surgeon: Leta Baptist, MD;  Location: [facility];  Service: ENT;  Laterality: Bilateral;  . TONSILLECTOMY AND  ADENOIDECTOMY    . TUBAL LIGATION    . UPPER GASTROINTESTINAL ENDOSCOPY  01/19/12    Family History  Problem Relation Age of Onset  . Heart disease Father   . Osteoporosis Father   . Osteoporosis Mother   . Mental illness Paternal Aunt   . Skin cancer Maternal Grandmother   . Hypertension Maternal Grandfather   . Heart disease Paternal Grandfather   . Celiac disease Grandchild     Allergies  Allergen Reactions  . Augmentin [Amoxicillin-Pot Clavulanate] Diarrhea  . Other     Multiple food intolerances. Eggs, corn, potatoes, yeast, millett, buckwheat, sesame , soy.  Also observes a dairy and wheat free diet.  Marland Kitchen Prevnar [Pneumococcal 13-Val Conj Vacc]     Vaccine contains yeast   . Sulfonamide Derivatives Rash    Arms only.  . Tape Rash    Current Medications:   Current Outpatient Medications:  .  ADVAIR DISKUS 100-50 MCG/DOSE AEPB, Inhale 1 puff into the lungs 2 (two) times daily. , Disp: , Rfl:  .  ARMOUR THYROID 120 MG tablet, Take 1 tablet by mouth daily., Disp: , Rfl:  .  Azelastine-Fluticasone (DYMISTA) 137-50 MCG/ACT SUSP, Place 1 spray into the nose daily., Disp: , Rfl:  .  Biotin 5000 MCG CAPS, Take 1 capsule by mouth daily. , Disp: , Rfl:  .  cetirizine (ZYRTEC) 10 MG tablet, Take 10 mg by mouth every evening. , Disp: , Rfl:  .  CHELATED ZINC PO, Take 30 mg by mouth every evening. , Disp: , Rfl:  .  Cholecalciferol (VITAMIN D PO), Take 5,000 each by mouth daily. , Disp: , Rfl:  .  EPINEPHrine (EPI-PEN) 0.3 mg/0.3 mL DEVI, Inject 0.3 mg into the muscle once. For previous allergies, maybe gluten, Disp: , Rfl:  .  ESTROGENS CONJUGATED PO, by Subdermal route. , Disp: , Rfl:  .  FLUoxetine (PROZAC) 10 MG capsule, TAKE 3 CAPSULES DAILY AS DIRECTED., Disp: 93 capsule, Rfl: 0 .  HYDROcodone-acetaminophen (NORCO/VICODIN) 5-325 MG tablet, Take 1 tablet by mouth every 6 (six) hours as needed for severe pain., Disp: 6 tablet, Rfl: 0 .  magnesium gluconate (MAGONATE) 500 MG  tablet, Take 1,500 mg by mouth every evening. , Disp: , Rfl:  .  Multiple Minerals-Vitamins (NUTRA-SUPPORT BONE) CAPS, Take 1-2 tablets by mouth See admin instructions. Take 1 tablet every morning and 2 tablets every night, Disp: , Rfl:  .  multivitamin-lutein (OCUVITE-LUTEIN) CAPS, Take 1 capsule by mouth daily., Disp: , Rfl:  .  NON FORMULARY, Methylation Complete - take one tablet by mouth daily, Disp: , Rfl:  .  Nutritional Supplements (DHEA PO), Take 5 mg by mouth  daily., Disp: , Rfl:  .  Omega-3 Fatty Acids (SALMON OIL PO), Take 1,300 mg by mouth every evening. , Disp: , Rfl:  .  ondansetron (ZOFRAN) 4 MG tablet, Take 1 tablet (4 mg total) by mouth every 8 (eight) hours as needed., Disp: 12 tablet, Rfl: 0 .  OVER THE COUNTER MEDICATION, , Disp: , Rfl:  .  Pregnenolone POWD, Take 75 mg by mouth daily. , Disp: , Rfl:  .  Probiotic Product (PROBIOTIC DAILY PO), Take by mouth. metaflora IB, Disp: , Rfl:  .  Progesterone Micronized (PROGESTERONE PO), Take 300 mg by mouth every evening. , Disp: , Rfl:  .  tamsulosin (FLOMAX) 0.4 MG CAPS capsule, Take 1 capsule (0.4 mg total) by mouth daily. Until symptoms resolve, Disp: 30 capsule, Rfl: 0 .  vitamin C (ASCORBIC ACID) 500 MG tablet, Take 500 mg by mouth daily., Disp: , Rfl:  .  VITAMIN K PO, Take 150 mcg by mouth daily., Disp: , Rfl:  .  doxycycline (VIBRA-TABS) 100 MG tablet, Take 1 tablet (100 mg total) by mouth 2 (two) times daily., Disp: 20 tablet, Rfl: 0   Review of Systems:   Review of Systems  Constitutional: Negative for chills.  HENT: Positive for congestion (Nasal, ), hoarse voice and sinus pressure. Negative for sore throat.   Respiratory: Negative for shortness of breath.     Vitals:   Vitals:   02/10/18 1029  BP: 100/60  Pulse: 63  Temp: 98.6 F (37 C)  TempSrc: Oral  SpO2: 98%  Weight: 130 lb 4 oz (59.1 kg)  Height: 5' 3.75" (1.619 m)     Body mass index is 22.53 kg/m.  Physical Exam:   Physical Exam   Constitutional: She appears well-developed. She is cooperative.  Non-toxic appearance. She does not have a sickly appearance. She does not appear ill. No distress.  HENT:  Head: Normocephalic and atraumatic.  Right Ear: Tympanic membrane, external ear and ear canal normal. Tympanic membrane is not erythematous, not retracted and not bulging.  Left Ear: Tympanic membrane, external ear and ear canal normal. Tympanic membrane is not erythematous, not retracted and not bulging.  Nose: Mucosal edema and rhinorrhea present. Right sinus exhibits maxillary sinus tenderness. Right sinus exhibits no frontal sinus tenderness. Left sinus exhibits maxillary sinus tenderness. Left sinus exhibits no frontal sinus tenderness.  Mouth/Throat: Uvula is midline and mucous membranes are normal. Posterior oropharyngeal erythema present. No posterior oropharyngeal edema. Tonsils are 0 on the right. Tonsils are 0 on the left.  Eyes: Conjunctivae and lids are normal.  Neck: Trachea normal.  Cardiovascular: Normal rate, regular rhythm, S1 normal, S2 normal and normal heart sounds.  Pulmonary/Chest: Effort normal and breath sounds normal. She has no decreased breath sounds. She has no wheezes. She has no rhonchi. She has no rales.  Lymphadenopathy:    She has no cervical adenopathy.  Neurological: She is alert.  Skin: Skin is warm, dry and intact.  Psychiatric: She has a normal mood and affect. Her speech is normal and behavior is normal.  Nursing note and vitals reviewed.   Assessment and Plan:    Mary Leach was seen today for sinus problem.  Diagnoses and all orders for this visit:  Acute non-recurrent maxillary sinusitis  Other orders -     doxycycline (VIBRA-TABS) 100 MG tablet; Take 1 tablet (100 mg total) by mouth 2 (two) times daily.   No red flags on exam.  Will initiate doxycycline per orders. Discussed taking medications as prescribed.  Reviewed return precautions including worsening fever, SOB, worsening  cough or other concerns. Push fluids and rest. I recommend that patient follow-up if symptoms worsen or persist despite treatment x 7-10 days, sooner if needed.  . Reviewed expectations re: course of current medical issues. . Discussed self-management of symptoms. . Outlined signs and symptoms indicating need for more acute intervention. . Patient verbalized understanding and all questions were answered. . See orders for this visit as documented in the electronic medical record. . Patient received an After-Visit Summary.  CMA or LPN served as scribe during this visit. History, Physical, and Plan performed by medical provider. The above documentation has been reviewed and is accurate and complete.  Inda Coke, PA-C

## 2018-02-13 ENCOUNTER — Ambulatory Visit: Payer: Medicare Other | Admitting: Family Medicine

## 2018-02-16 ENCOUNTER — Ambulatory Visit (INDEPENDENT_AMBULATORY_CARE_PROVIDER_SITE_OTHER): Payer: 59 | Admitting: Psychiatry

## 2018-02-16 DIAGNOSIS — F411 Generalized anxiety disorder: Secondary | ICD-10-CM | POA: Diagnosis not present

## 2018-03-02 ENCOUNTER — Ambulatory Visit: Payer: 59 | Admitting: Psychiatry

## 2018-03-06 ENCOUNTER — Telehealth: Payer: Self-pay | Admitting: Family Medicine

## 2018-03-06 NOTE — Telephone Encounter (Signed)
See note  Copied from [place] [id number]. Topic: General - Other >> Mar 06, 2018 12:07 PM Marin Olp L wrote: Reason for CRM: Needs flu vaccine that is safe to take that doesn't have yeast, eggs, or gluten. She is allergic. Please advise.

## 2018-03-06 NOTE — Telephone Encounter (Signed)
Notified patient that we do not have these in stock this year. I advise to check with her pharmacy or the health department.

## 2018-03-08 ENCOUNTER — Other Ambulatory Visit: Payer: Self-pay | Admitting: Family Medicine

## 2018-03-16 ENCOUNTER — Ambulatory Visit: Payer: 59 | Admitting: Psychiatry

## 2018-03-30 ENCOUNTER — Ambulatory Visit: Payer: 59 | Admitting: Psychiatry

## 2018-03-30 NOTE — Progress Notes (Signed)
Fatimata Talsma is a 67 y.o. female is here for follow up.  History of Present Illness:   HPI: Was worried that sinus infection brewing. Started Zyrtec and Dymista. Somewhat improved. No fever. Still with sore throat/PND.   There are no preventive care reminders to display for this patient.   Depression screen [facility] 2/9 06/13/2017 03/17/2017  Decreased Interest 0 0  Down, Depressed, Hopeless 0 0  PHQ - 2 Score 0 0  Altered sleeping 0 -  Tired, decreased energy 0 -  Change in appetite 0 -  Feeling bad or failure about yourself  0 -  Trouble concentrating 2 -  Moving slowly or fidgety/restless 0 -  Suicidal thoughts 0 -  PHQ-9 Score 2 -   PMHx, SurgHx, SocialHx, FamHx, Medications, and Allergies were reviewed in the Visit Navigator and updated as appropriate.   Patient Active Problem List   Diagnosis Date Noted  . High risk medication use 04/03/2018  . Chronic maxillary sinusitis 04/03/2018  . Elevated testosterone level in female 04/03/2018  . Allergic conjunctivitis and rhinitis, bilateral 04/03/2018  . Celiac sprue 07/02/2015  . Allergic rhinitis due to pollen 12/18/2014  . H/O resection of large bowel 12/15/2013  . 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 Asthma 10/18/2011  . 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 Smoking status: Never Smoker  . Smokeless tobacco: Never Used  Substance Use Topics  . Alcohol use: Yes    Alcohol/week: 3.0 standard drinks    Types: 3 Standard drinks or equivalent per week    Comment: 1-3 glasses of wine a week or gluten free beer  . Drug use: No   Current Medications and Allergies:   .  ADVAIR DISKUS 100-50 MCG/DOSE AEPB, Inhale 1 puff into the lungs 2 (two) times daily. , Disp: , Rfl:  .  ARMOUR THYROID 120 MG tablet, Take 1 tablet by mouth daily., Disp: , Rfl:  .  Azelastine-Fluticasone (DYMISTA) 137-50 MCG/ACT SUSP, Place 2 sprays  into the nose daily. , Disp: , Rfl:  .  Biotin 5000 MCG CAPS, Take 1 capsule by mouth daily. , Disp: , Rfl:  .  cetirizine (ZYRTEC) 10 MG tablet, Take 10 mg by mouth every evening. , Disp: , Rfl:  .  CHELATED ZINC PO, Take 30 mg by mouth every evening. , Disp: , Rfl:  .  Cholecalciferol (VITAMIN D PO), Take 5,000 each by mouth daily. , Disp: , Rfl:  .  EPINEPHrine (EPI-PEN) 0.3 mg/0.3 mL DEVI, Inject 0.3 mg into the muscle once. For previous allergies, maybe gluten, Disp: , Rfl:  .  ESTROGENS CONJUGATED PO, by Subdermal route. , Disp: , Rfl:  .  FLUoxetine (PROZAC) 10 MG capsule, TAKE 3 CAPSULES DAILY AS DIRECTED., Disp: 93 capsule, Rfl: 1 .  magnesium gluconate (MAGONATE) 500 MG tablet, Take 1,500 mg by mouth every evening. , Disp: , Rfl:  .  Multiple Minerals-Vitamins (NUTRA-SUPPORT BONE) CAPS, Take 1-2 tablets by mouth See admin instructions. Take 1 tablet every morning and 2 tablets every night, Disp: , Rfl:  .  multivitamin-lutein (OCUVITE-LUTEIN) CAPS, Take 1 capsule by mouth daily., Disp: , Rfl:  .  NON FORMULARY, Methylation Complete - take one tablet by mouth daily, Disp: , Rfl:  .  Nutritional Supplements (DHEA PO), Take 5 mg by mouth daily., Disp: , Rfl:  .  Omega-3 Fatty Acids (SALMON OIL PO), Take 1,300 mg  by mouth every evening. , Disp: , Rfl:  .  OVER THE COUNTER MEDICATION, , Disp: , Rfl:  .  Pregnenolone POWD, Take 75 mg by mouth daily. , Disp: , Rfl:  .  Probiotic Product (PROBIOTIC DAILY PO), Take by mouth. metaflora IB, Disp: , Rfl:  .  Progesterone Micronized (PROGESTERONE PO), Take 300 mg by mouth every evening. , Disp: , Rfl:  .  vitamin C (ASCORBIC ACID) 500 MG tablet, Take 500 mg by mouth daily., Disp: , Rfl:  .  VITAMIN K PO, Take 150 mcg by mouth daily., Disp: , Rfl:  .  levofloxacin (LEVAQUIN) 500 MG tablet, Take 1 tablet (500 mg total) by mouth daily., Disp: 10 tablet, Rfl: 0   Allergies  Allergen Reactions  . Augmentin [Amoxicillin-Pot Clavulanate] Diarrhea  .  Other     Multiple food intolerances. Eggs, corn, potatoes, yeast, millett, buckwheat, sesame , soy.  Also observes a dairy and wheat free diet.  Marland Kitchen Prevnar [Pneumococcal 13-Val Conj Vacc]     Vaccine contains yeast   . Sulfonamide Derivatives Rash    Arms only.  . Tape Rash   Review of Systems   Pertinent items are noted in the HPI. Otherwise, ROS is negative.  Vitals:   Vitals:   04/03/18 0844  BP: 110/60  Pulse: 75  Resp: 16  Temp: 98.1 F (36.7 C)  TempSrc: Oral  SpO2: 100%  Weight: 133 lb (60.3 kg)  Height: 5' 3.75" (1.619 m)     Body mass index is 23.01 kg/m.  Physical Exam:   Physical Exam  Constitutional: She appears well-nourished.  HENT:  Head: Normocephalic and atraumatic.  Mouth/Throat: Posterior oropharyngeal edema present.  Eyes: Pupils are equal, round, and reactive to light. EOM are normal.  Neck: Normal range of motion. Neck supple.  Cardiovascular: Normal rate, regular rhythm, normal heart sounds and intact distal pulses.  Pulmonary/Chest: Effort normal.  Abdominal: Soft.  Skin: Skin is warm.  Psychiatric: She has a normal mood and affect. Her behavior is normal.  Nursing note and vitals reviewed.  Assessment and Plan:   Chronic maxillary sinusitis No red flags today. She will continue Zyrtec and Dymista. Okay safety net Rx to hold since she will be traveling soon.  High risk medication use Patient going to [facility] and they prescribe high doses of Armour thyroid as well as testosterone pellets. They follow labs.  Orders Placed This Encounter  Procedures  . Pneumococcal conjugate vaccine 13-valent IM   Meds ordered this encounter  Medications  . levofloxacin (LEVAQUIN) 500 MG tablet    Sig: Take 1 tablet (500 mg total) by mouth daily.    Dispense:  10 tablet    Refill:  0   . Reviewed expectations re: course of current medical issues. . Discussed self-management of symptoms. . Outlined signs and symptoms  indicating need for more acute intervention. . Patient verbalized understanding and all questions were answered. Marland Kitchen Health Maintenance issues including appropriate healthy diet, exercise, and smoking avoidance were discussed with patient. . See orders for this visit as documented in the electronic medical record. . Patient received an After Visit Summary.  Briscoe Deutscher, DO [place], [facility] 04/03/2018

## 2018-04-03 ENCOUNTER — Encounter: Payer: Self-pay | Admitting: Family Medicine

## 2018-04-03 ENCOUNTER — Ambulatory Visit (INDEPENDENT_AMBULATORY_CARE_PROVIDER_SITE_OTHER): Payer: Self-pay | Admitting: Family Medicine

## 2018-04-03 VITALS — BP 110/60 | HR 75 | Temp 98.1°F | Resp 16 | Ht 63.75 in | Wt 133.0 lb

## 2018-04-03 DIAGNOSIS — Z23 Encounter for immunization: Secondary | ICD-10-CM

## 2018-04-03 DIAGNOSIS — R7989 Other specified abnormal findings of blood chemistry: Secondary | ICD-10-CM

## 2018-04-03 DIAGNOSIS — H1013 Acute atopic conjunctivitis, bilateral: Secondary | ICD-10-CM | POA: Insufficient documentation

## 2018-04-03 DIAGNOSIS — J309 Allergic rhinitis, unspecified: Secondary | ICD-10-CM

## 2018-04-03 DIAGNOSIS — Z79899 Other long term (current) drug therapy: Secondary | ICD-10-CM

## 2018-04-03 DIAGNOSIS — J320 Chronic maxillary sinusitis: Secondary | ICD-10-CM

## 2018-04-03 MED ORDER — LEVOFLOXACIN 500 MG PO TABS: 500.0000 mg | ORAL_TABLET | Freq: Every day | ORAL | 0 refills | Status: DC

## 2018-04-03 NOTE — Assessment & Plan Note (Signed)
Patient going to [place] and they prescribe high doses of Armour thyroid as well as testosterone pellets. They follow labs.

## 2018-04-03 NOTE — Assessment & Plan Note (Signed)
No red flags today. She will continue Zyrtec and Dymista. Okay safety net Rx to hold since she will be traveling soon.

## 2018-04-10 ENCOUNTER — Ambulatory Visit: Payer: Medicare Other | Admitting: Family Medicine

## 2018-05-08 ENCOUNTER — Other Ambulatory Visit: Payer: Self-pay | Admitting: Family Medicine

## 2018-05-08 ENCOUNTER — Ambulatory Visit: Payer: Self-pay | Admitting: Family Medicine

## 2018-05-08 NOTE — Progress Notes (Signed)
Mary Leach is a 67 y.o. female is here for follow up.  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   HPI: Patient is still having congestion. She has had productive cough. Little energy she is sleeping 9 or more hours a night. She has completed round of Levaquin has had some improvement.  She does not think that she has had any fever. No ear pain or fullness, nausea or vomiting. Traveling every other week out of state with husband. Allergist retested recently - allergy to mold, mildew, dust. Previous allergy injections until 1 month ago due to travel. No issues with exercise - able to climb 3 flights of stairs without CP, SOB. Due for PFTs.   Depression screen [facility] 2/9 05/09/2018 06/13/2017 03/17/2017  Decreased Interest 0 0 0  Down, Depressed, Hopeless 0 0 0  PHQ - 2 Score 0 0 0  Altered sleeping 0 0 -  Tired, decreased energy 0 0 -  Change in appetite 0 0 -  Feeling bad or failure about yourself  0 0 -  Trouble concentrating 0 2 -  Moving slowly or fidgety/restless 0 0 -  Suicidal thoughts - 0 -  PHQ-9 Score 0 2 -  Difficult doing work/chores Not difficult at all - -   PMHx, SurgHx, SocialHx, FamHx, Medications, and Allergies were reviewed in the Visit Navigator and updated as appropriate.   Patient Active Problem List   Diagnosis Date Noted  . High risk medication use 04/03/2018  . Chronic maxillary sinusitis 04/03/2018  . Elevated testosterone level in female 04/03/2018  . Allergic conjunctivitis and rhinitis, bilateral 04/03/2018  . Celiac sprue 07/02/2015  . Allergic rhinitis due to pollen 12/18/2014  . H/O resection of large bowel 12/15/2013  . 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 Asthma 10/18/2011  . 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 Smoking status: Never Smoker  . Smokeless tobacco: Never Used  Substance Use Topics  . Alcohol  use: Yes    Alcohol/week: 3.0 standard drinks    Types: 3 Standard drinks or equivalent per week    Comment: 1-3 glasses of wine a week or gluten free beer  . Drug use: No   Current Medications and Allergies:   .  ADVAIR DISKUS 100-50 MCG/DOSE AEPB, Inhale 1 puff into the lungs 2 (two) times daily. , Disp: , Rfl:  .  ARMOUR THYROID 120 MG tablet, Take 1 tablet by mouth daily., Disp: , Rfl:  .  Azelastine-Fluticasone (DYMISTA) 137-50 MCG/ACT SUSP, Place 2 sprays into the nose daily. , Disp: , Rfl:  .  Biotin 5000 MCG CAPS, Take 1 capsule by mouth daily. , Disp: , Rfl:  .  cetirizine (ZYRTEC) 10 MG tablet, Take 10 mg by mouth every evening. , Disp: , Rfl:  .  CHELATED ZINC PO, Take 30 mg by mouth every evening. , Disp: , Rfl:  .  Cholecalciferol (VITAMIN D PO), Take 5,000 each by mouth daily. , Disp: , Rfl:  .  EPINEPHrine (EPI-PEN) 0.3 mg/0.3 mL DEVI, Inject 0.3 mg into the muscle once. For previous allergies, maybe gluten, Disp: , Rfl:  .  ESTROGENS CONJUGATED PO, by Subdermal route. , Disp: , Rfl:  .  FLUoxetine (PROZAC) 10 MG capsule, TAKE 3 CAPSULES DAILY AS DIRECTED., Disp: 93 capsule, Rfl: 0 .  magnesium gluconate (MAGONATE) 500 MG tablet, Take 1,500 mg by mouth  every evening. , Disp: , Rfl:  .  Multiple Minerals-Vitamins (NUTRA-SUPPORT BONE) CAPS, Take 1-2 tablets by mouth See admin instructions. Take 1 tablet every morning and 2 tablets every night, Disp: , Rfl:  .  NON FORMULARY, Methylation Complete - take one tablet by mouth daily, Disp: , Rfl:  .  Nutritional Supplements (DHEA PO), Take 5 mg by mouth daily., Disp: , Rfl:  .  OVER THE COUNTER MEDICATION, , Disp: , Rfl:  .  Pregnenolone POWD, Take 75 mg by mouth daily. , Disp: , Rfl:  .  Probiotic Product (PROBIOTIC DAILY PO), Take by mouth. metaflora IB, Disp: , Rfl:  .  Progesterone Micronized (PROGESTERONE PO), Take 300 mg by mouth every evening. , Disp: , Rfl:  .  vitamin C (ASCORBIC ACID) 500 MG tablet, Take 500 mg by mouth  daily., Disp: , Rfl:  .  VITAMIN K PO, Take 150 mcg by mouth daily., Disp: , Rfl:  .  Omega-3 Fatty Acids (SALMON OIL PO), Take 1,300 mg by mouth every evening. , Disp: , Rfl:    Allergies  Allergen Reactions  . Augmentin [Amoxicillin-Pot Clavulanate] Diarrhea  . Other     Multiple food intolerances. Eggs, corn, potatoes, yeast, millett, buckwheat, sesame , soy.  Also observes a dairy and wheat free diet.  Marland Kitchen Prevnar [Pneumococcal 13-Val Conj Vacc]     Vaccine contains yeast   . Sulfonamide Derivatives Rash    Arms only.  . Tape Rash   Review of Systems   Pertinent items are noted in the HPI. Otherwise, a complete ROS is negative.  Vitals:   Vitals:   05/09/18 1342  BP: 110/60  Pulse: 77  Temp: 98.6 F (37 C)  TempSrc: Oral  SpO2: 99%  Weight: 134 lb 12.8 oz (61.1 kg)  Height: 5' 3.75" (1.619 m)     Body mass index is 23.32 kg/m.  Physical Exam:   Physical Exam Vitals signs and nursing note reviewed.  HENT:     Head: Normocephalic and atraumatic.     Nose:     Right Sinus: Maxillary sinus tenderness present.     Left Sinus: Maxillary sinus tenderness present.  Eyes:     Pupils: Pupils are equal, round, and reactive to light.  Neck:     Musculoskeletal: Normal range of motion and neck supple.  Cardiovascular:     Rate and Rhythm: Normal rate and regular rhythm.     Heart sounds: Normal heart sounds.  Pulmonary:     Effort: Pulmonary effort is normal.  Abdominal:     Palpations: Abdomen is soft.  Skin:    General: Skin is warm.  Psychiatric:        Behavior: Behavior normal.    Results for orders placed or performed during the hospital encounter of 01/18/18  CBC with Differential/Platelet  Result Value Ref Range   WBC 7.8 4.0 - 10.5 K/uL   RBC 4.12 3.87 - 5.11 MIL/uL   Hemoglobin 13.7 12.0 - 15.0 g/dL   HCT 39.9 36.0 - 46.0 %   MCV 96.8 78.0 - 100.0 fL   MCH 33.3 26.0 - 34.0 pg   MCHC 34.3 30.0 - 36.0 g/dL   RDW 13.0 11.5 - 15.5 %   Platelets 228  150 - 400 K/uL   Neutrophils Relative % 90 %   Neutro Abs 7.0 1.7 - 7.7 K/uL   Lymphocytes Relative 8 %   Lymphs Abs 0.6 (L) 0.7 - 4.0 K/uL   Monocytes Relative 2 %  Monocytes Absolute 0.1 0.1 - 1.0 K/uL   Eosinophils Relative 0 %   Eosinophils Absolute 0.0 0.0 - 0.7 K/uL   Basophils Relative 0 %   Basophils Absolute 0.0 0.0 - 0.1 K/uL  Comprehensive metabolic panel  Result Value Ref Range   Sodium 137 135 - 145 mmol/L   Potassium 3.8 3.5 - 5.1 mmol/L   Chloride 102 98 - 111 mmol/L   CO2 25 22 - 32 mmol/L   Glucose, Bld 104 (H) 70 - 99 mg/dL   BUN 13 8 - 23 mg/dL   Creatinine, Ser 0.57 0.44 - 1.00 mg/dL   Calcium 8.8 (L) 8.9 - 10.3 mg/dL   Total Protein 7.0 6.5 - 8.1 g/dL   Albumin 4.1 3.5 - 5.0 g/dL   AST 28 15 - 41 U/L   ALT 21 0 - 44 U/L   Alkaline Phosphatase 78 38 - 126 U/L   Total Bilirubin 0.7 0.3 - 1.2 mg/dL   GFR calc non Af Amer >60 >60 mL/min   GFR calc Af Amer >60 >60 mL/min   Anion gap 10 5 - 15  Urinalysis, Routine w reflex microscopic  Result Value Ref Range   Color, Urine YELLOW YELLOW   APPearance CLEAR CLEAR   Specific Gravity, Urine 1.010 1.005 - 1.030   pH 9.0 (H) 5.0 - 8.0   Glucose, UA NEGATIVE NEGATIVE mg/dL   Hgb urine dipstick NEGATIVE NEGATIVE   Bilirubin Urine NEGATIVE NEGATIVE   Ketones, ur 20 (A) NEGATIVE mg/dL   Protein, ur NEGATIVE NEGATIVE mg/dL   Nitrite NEGATIVE NEGATIVE   Leukocytes, UA NEGATIVE NEGATIVE   Assessment and Plan:   Mary Leach was seen today for follow-up.  Diagnoses and all orders for this visit:  Chronic maxillary sinusitis Comments: Patient has been on multiple rounds of Abx and allergy injections, still with pain. CT today. Will send to ENT. Orders: -     CT Maxillofacial WO CM  Chronic intractable headache, unspecified headache type -     CT Maxillofacial WO CM  Allergic conjunctivitis and rhinitis, bilateral  Mild persistent asthma without complication  High risk medication use Comments: Testosterone  through [facility].   . Orders and follow up as documented in [place], reviewed diet, exercise and weight control, cardiovascular risk and specific lipid/LDL goals reviewed, reviewed medications and side effects in detail.  . Reviewed expectations re: course of current medical issues. . Outlined signs and symptoms indicating need for more acute intervention. . Patient verbalized understanding and all questions were answered. . Patient received an After Visit Summary.  CMA served as Education administrator during this visit. History, Physical, and Plan performed by medical provider. The above documentation has been reviewed and is accurate and complete. Briscoe Deutscher, D.O.   Briscoe Deutscher, DO [place] [place] 05/10/2018   Records requested if needed. Time spent with the patient: 45 minutes, of which >50% was spent in obtaining information about her symptoms, reviewing her previous labs, evaluations, and treatments, counseling her about her condition (please see the discussed topics above), and developing a plan to further investigate it; she had a number of questions which I addressed.

## 2018-05-09 ENCOUNTER — Ambulatory Visit (INDEPENDENT_AMBULATORY_CARE_PROVIDER_SITE_OTHER): Payer: Medicare Other | Admitting: Family Medicine

## 2018-05-09 ENCOUNTER — Ambulatory Visit (INDEPENDENT_AMBULATORY_CARE_PROVIDER_SITE_OTHER)
Admission: RE | Admit: 2018-05-09 | Discharge: 2018-05-09 | Disposition: A | Discharge status: Home / Self Care | Payer: Medicare Other | Source: Ambulatory Visit | Attending: Family Medicine | Admitting: Family Medicine

## 2018-05-09 VITALS — BP 110/60 | HR 77 | Temp 98.6°F | Ht 63.75 in | Wt 134.8 lb

## 2018-05-09 DIAGNOSIS — G8929 Other chronic pain: Secondary | ICD-10-CM

## 2018-05-09 DIAGNOSIS — J4530 Mild persistent asthma, uncomplicated: Secondary | ICD-10-CM | POA: Diagnosis not present

## 2018-05-09 DIAGNOSIS — J309 Allergic rhinitis, unspecified: Secondary | ICD-10-CM

## 2018-05-09 DIAGNOSIS — J320 Chronic maxillary sinusitis: Secondary | ICD-10-CM

## 2018-05-09 DIAGNOSIS — H1013 Acute atopic conjunctivitis, bilateral: Secondary | ICD-10-CM | POA: Diagnosis not present

## 2018-05-09 DIAGNOSIS — R51 Headache: Secondary | ICD-10-CM | POA: Diagnosis not present

## 2018-05-09 DIAGNOSIS — R519 Headache, unspecified: Secondary | ICD-10-CM

## 2018-05-09 DIAGNOSIS — Z79899 Other long term (current) drug therapy: Secondary | ICD-10-CM

## 2018-05-10 ENCOUNTER — Encounter: Payer: Self-pay | Admitting: Family Medicine

## 2018-05-11 ENCOUNTER — Telehealth: Payer: Self-pay | Admitting: Obstetrics and Gynecology

## 2018-05-11 NOTE — Telephone Encounter (Signed)
Patient called and said she has been experiencing some post menopausal bleeding for the last two weeks. She'd like to schedule an appointment.  Last seen: 01/12/18

## 2018-05-11 NOTE — Telephone Encounter (Signed)
Spoke with patient. Patient reports having hormone pellets placed at [facility] in 03/2018, noticed spotting 1 wk after. Bleeding started again in 1st wk of December, returned call to [place], was started on estradiol 0.025mg  patch twice wkly, was advised to f/u with GYN if bleeding continued.   Bleeding has continued, did decrease some with estradiol patch. Patient describes flow as a normal period, changing pad q12h. "Possibly some intermittent pelvic pressure". Denies pain, heavy bleeding, lower back pain, fever/chills.   OV scheduled with Dr. Quincy Simmonds on 12/20 at 9:30am. Advised Dr. Quincy Simmonds will review, our office will return call if any additional recommendations.   Routing to provider for final review. Patient is agreeable to disposition. Will close encounter.

## 2018-05-11 NOTE — Progress Notes (Signed)
GYNECOLOGY  VISIT   HPI: 67 y.o.   Married  Caucasian  female   G2P2 with Patient's last menstrual period was 05/24/2006.   here for postmenopausal bleeding.   Using HRT and testosterone from [facility].   Had hormonal pellet (estrogen and testosterone) insertion on Nov. 19.  Her estrogen was decreased in that dosage.  Started spotting following Thanksgiving.  12/2 - 12/7 had bleeding one week after pellet insertion.  Progesterone raised to 400 mg nightly through [facility]. 12/11 bleeding started increasing. Estradiol patch recommended by Franz Dell, and this was started 12/14. Bleeding decreased but then forgot to change it, so bleeding increased again.   Other than the recent bleeding, she only had bleeding soon after she started her HRT.  Brings in a copy of her labs from [facility].  Has multiple alterations noted:  Low WBC, low TSH, elevated LFTs, high testosterone.   GYNECOLOGIC HISTORY: Patient's last menstrual period was 05/24/2006. Contraception:  Tubal/Postmenopausal Menopausal hormone therapy: Estrogen patch, Progesterone and Testosterone pellets Last mammogram: 09/27/17 BIRADS 1 negative/density b Last pap smear: 01-12-18 Neg, 12-26-14 Neg:Neg HR HPV        OB History    Gravida  2   Para  2   Term      Preterm      AB      Living  2     SAB      TAB      Ectopic      Multiple      Live Births                 Patient Active Problem List   Diagnosis Date Noted  . High risk medication use 04/03/2018  . Chronic maxillary sinusitis 04/03/2018  . Elevated testosterone level in female 04/03/2018  . Allergic conjunctivitis and rhinitis, bilateral 04/03/2018  . Celiac sprue 07/02/2015  . Allergic rhinitis due to pollen 12/18/2014  . H/O resection of large bowel 12/15/2013  . 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 Asthma 10/18/2011  . Rectal Perforation due to fecal impaction s/p Hartmann procedure 5/16 10/08/2011     Past Medical History:  Diagnosis Date  . Allergic rhinitis   . Anxiety   . Asthma   . Bipolar disorder ([place])   . Celiac disease   . Depression   . History of colonic polyps   . Ileus following gastrointestinal surgery ([place]) 10/18/2011  . Malnutrition following gastrointestinal surgery 10/18/2011  . Osteoporosis 2018  . RBBB (right bundle branch block)     Past Surgical History:  Procedure Laterality Date  . COLON SURGERY    . COLONOSCOPY W/ POLYPECTOMY    . COLOSTOMY TAKEDOWN  05/29/2012   Procedure: LAPAROSCOPIC COLOSTOMY TAKEDOWN;  Surgeon: Odis Hollingshead, MD;  Location: [facility];  Service: General;  Laterality: N/A;  Laparoscopic Assisted Colostomy Closure  with proctoscopy  . CYSTECTOMY     LEFT FOOT, RIGHT HAND  . FOOT SURGERY    . LAPAROTOMY  10/07/2011   Procedure: EXPLORATORY LAPAROTOMY;  Surgeon: Odis Hollingshead, MD;  Location: [facility];  Service: General;  Laterality: N/A;  hartmann procedure and creation of colostomy  . NASAL SEPTOPLASTY W/ TURBINOPLASTY Bilateral 11/11/2015   Procedure: SEPTOPLASTY BILATERAL TURBINATE  REDUCTION ;  Surgeon: Leta Baptist, MD;  Location: [facility];  Service: ENT;  Laterality: Bilateral;  . NASAL SINUS SURGERY Bilateral 11/11/2015   Procedure: BILATERAL ENDOSCOPIC  MAXILLARY ANTROSTOMY;  Surgeon:  Leta Baptist, MD;  Location: [facility];  Service: ENT;  Laterality: Bilateral;  . TONSILLECTOMY AND ADENOIDECTOMY    . TUBAL LIGATION    . UPPER GASTROINTESTINAL ENDOSCOPY  01/19/12    Current Outpatient Medications  Medication Sig Dispense Refill  . ADVAIR DISKUS 100-50 MCG/DOSE AEPB Inhale 1 puff into the lungs 2 (two) times daily.     Francia Greaves THYROID 120 MG tablet Take 1 tablet by mouth daily.    . Azelastine-Fluticasone (DYMISTA) 137-50 MCG/ACT SUSP Place 2 sprays into the nose daily.     . Biotin 5000 MCG CAPS Take 1 capsule by mouth daily.     . cetirizine (ZYRTEC) 10 MG tablet Take 10 mg by mouth every evening.      . CHELATED ZINC PO Take 30 mg by mouth every evening.     . Cholecalciferol (VITAMIN D PO) Take 5,000 each by mouth daily.     Marland Kitchen EPINEPHrine (EPI-PEN) 0.3 mg/0.3 mL DEVI Inject 0.3 mg into the muscle once. For previous allergies, maybe gluten    . ESTROGENS CONJUGATED PO by Subdermal route.     Marland Kitchen FLUoxetine (PROZAC) 10 MG capsule TAKE 3 CAPSULES DAILY AS DIRECTED. 93 capsule 0  . magnesium gluconate (MAGONATE) 500 MG tablet Take 1,500 mg by mouth every evening.     . Multiple Minerals-Vitamins (NUTRA-SUPPORT BONE) CAPS Take 1-2 tablets by mouth See admin instructions. Take 1 tablet every morning and 2 tablets every night    . NON FORMULARY Methylation Complete - take one tablet by mouth daily    . Nutritional Supplements (DHEA PO) Take 5 mg by mouth daily.    . Omega-3 Fatty Acids (SALMON OIL PO) Take 1,300 mg by mouth every evening.     Marland Kitchen OVER THE COUNTER MEDICATION     . Pregnenolone POWD Take 75 mg by mouth daily.     . Probiotic Product (PROBIOTIC DAILY PO) Take by mouth. metaflora IB    . Progesterone Micronized (PROGESTERONE PO) Take 300 mg by mouth every evening.     . vitamin C (ASCORBIC ACID) 500 MG tablet Take 500 mg by mouth daily.    Marland Kitchen VITAMIN K PO Take 150 mcg by mouth daily.     No current facility-administered medications for this visit.      ALLERGIES: Augmentin [amoxicillin-pot clavulanate]; Other; Prevnar [pneumococcal 13-val conj vacc]; Sulfonamide derivatives; and Tape  Family History  Problem Relation Age of Onset  . Heart disease Father   . Osteoporosis Father   . Osteoporosis Mother   . Mental illness Paternal Aunt   . Skin cancer Maternal Grandmother   . Hypertension Maternal Grandfather   . Heart disease Paternal Grandfather   . Celiac disease Grandchild     Social History   Socioeconomic History  . Marital status: Married    Spouse name: Not on file  . Number of children: Not on file  . Years of education: Not on file  . Highest education level:  Not on file  Occupational History    Employer: [employer]  . Financial resource strain: Not on file  . Food insecurity:    Worry: Not on file    Inability: Not on file  . Transportation needs:    Medical: Not on file    Non-medical: Not on file  Tobacco Use  . Smoking status: Never Smoker  . Smokeless tobacco: Never Used  Substance and Sexual Activity  . Alcohol use: Yes  Alcohol/week: 3.0 standard drinks    Types: 3 Standard drinks or equivalent per week    Comment: 1-3 glasses of wine a week or gluten free beer  . Drug use: No  . Sexual activity: Not Currently    Partners: Male    Birth control/protection: Post-menopausal  Lifestyle  . Physical activity:    Days per week: Not on file    Minutes per session: Not on file  . Stress: Not on file  Relationships  . Social connections:    Talks on phone: Not on file    Gets together: Not on file    Attends religious service: Not on file    Active member of club or organization: Not on file    Attends meetings of clubs or organizations: Not on file    Relationship status: Not on file  . Intimate partner violence:    Fear of current or ex partner: Not on file    Emotionally abused: Not on file    Physically abused: Not on file    Forced sexual activity: Not on file  Other Topics Concern  . Not on file  Social History Narrative   Married, lives with husband and son Jenny Reichmann.  [employer] ([employer]).  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    Review of Systems  All other systems reviewed and are negative.   PHYSICAL EXAMINATION:    BP 110/70 (BP Location: Right Arm, Patient Position: Sitting, Cuff Size: Normal)   Pulse 70   Ht 5' 3.75" (1.619 m)   Wt 133 lb (60.3 kg)   LMP 05/24/2006   BMI 23.01 kg/m     General appearance: alert, cooperative and appears stated age  Pelvic: External genitalia:  no lesions              Urethra:  normal appearing urethra with no masses, tenderness or  lesions              Bartholins and Skenes: normal                 Vagina: normal appearing vagina with normal color and discharge, no lesions              Cervix: no lesions.  Blood noted.                 Bimanual Exam:  Uterus:  normal size, contour, position, consistency, mobility, non-tender              Adnexa: no mass, fullness, tenderness           Chaperone was present for exam.  ASSESSMENT  Postmenopausal bleeding.  On HRT and testosterone tx.  Elevated testosterone level.  Multiple lab alterations.   PLAN  Postmenopausal bleeding discussed and the importance of evaluation to rule out both benign and malignant causes.  Return for sonohysterogram and EMB.  Procedures explained.  I suggested we may need to revisit her HRT dosages and goals. FU with PCP regarding lab alterations.  An After Visit Summary was printed and given to the patient.  __25_____ minutes face to face time of which over 50% was spent in counseling.     ______ minutes face to face time of which over 50% was spent in counseling.

## 2018-05-12 ENCOUNTER — Ambulatory Visit (INDEPENDENT_AMBULATORY_CARE_PROVIDER_SITE_OTHER): Payer: Medicare Other | Admitting: Obstetrics and Gynecology

## 2018-05-12 ENCOUNTER — Encounter: Payer: Self-pay | Admitting: Obstetrics and Gynecology

## 2018-05-12 ENCOUNTER — Other Ambulatory Visit: Payer: Self-pay

## 2018-05-12 VITALS — BP 110/70 | HR 70 | Ht 63.75 in | Wt 133.0 lb

## 2018-05-12 DIAGNOSIS — Z79890 Hormone replacement therapy (postmenopausal): Secondary | ICD-10-CM

## 2018-05-12 DIAGNOSIS — N950 Postmenopausal bleeding: Secondary | ICD-10-CM

## 2018-05-12 NOTE — Patient Instructions (Signed)
Postmenopausal Bleeding  Postmenopausal bleeding is any bleeding that a woman has after she has entered into menopause. Menopause is the end of a woman's fertile years. After menopause, a woman no longer ovulates and does not have menstrual periods. Postmenopausal bleeding may have various causes, including:  Menopausal hormone therapy (MHT).  Endometrial atrophy. After menopause, low estrogen hormone levels cause the membrane that lines the uterus (endometrium) to become thinner. You may have bleeding as the endometrium thins.  Endometrial hyperplasia. This condition is caused by excess estrogen hormones and low levels of progesterone hormones. The excess estrogen causes the endometrium to thicken, which can lead to bleeding. In some cases, this can lead to cancer of the uterus.  Endometrial cancer.  Non-cancerous growths (polyps) on the endometrium, the lining of the uterus, or the cervix.  Uterine fibroids. These are non-cancerous growths in or around the uterus muscle tissue that can cause heavy bleeding. Any type of postmenopausal bleeding, even if it appears to be a typical menstrual period, should be evaluated by your health care provider. Treatment will depend on the cause of the bleeding. Follow these instructions at home:  Pay attention to any changes in your symptoms.  Avoid using tampons and douches as told by your health care provider.  Change your pads regularly.  Get regular pelvic exams and Pap tests.  Take iron supplements as told by your health care provider.  Take over-the-counter and prescription medicines only as told by your health care provider.  Keep all follow-up visits as told by your health care provider. This is important. Contact a health care provider if:  Your bleeding lasts more than 1 week.  You have abdominal pain.  You have bleeding with or after sexual intercourse.  You have bleeding that happens more often than every 3 weeks. Get help  right away if:  You have a fever, chills, headache, dizziness, muscle aches, and bleeding.  You have severe pain with bleeding.  You are passing blood clots.  You have heavy bleeding, need more than 1 pad an hour, and have never experienced this before.  You feel faint. Summary  Postmenopausal bleeding is any bleeding that a woman has after she has entered into menopause.  Postmenopausal bleeding may have various causes. Treatment will depend on the cause of the bleeding.  Any type of postmenopausal bleeding, even if it appears to be a typical menstrual period, should be evaluated by your health care provider.  Be sure to pay attention to any changes in your symptoms and keep all follow-up visits as told by your health care provider. This information is not intended to replace advice given to you by your health care provider. Make sure you discuss any questions you have with your health care provider. Document Released: 08/18/2005 Document Revised: 08/03/2016 Document Reviewed: 08/03/2016 Elsevier Interactive Patient Education  2019 [employer].

## 2018-05-15 ENCOUNTER — Telehealth: Payer: Self-pay | Admitting: Obstetrics and Gynecology

## 2018-05-15 DIAGNOSIS — N950 Postmenopausal bleeding: Secondary | ICD-10-CM | POA: Insufficient documentation

## 2018-05-15 DIAGNOSIS — Z79890 Hormone replacement therapy (postmenopausal): Secondary | ICD-10-CM | POA: Insufficient documentation

## 2018-05-15 NOTE — Telephone Encounter (Addendum)
Patient cancelled her ultrasound appt for Thursday 05/18/18. See separate staff message. Patient says she is not bleeding right now.

## 2018-05-15 NOTE — Telephone Encounter (Signed)
Patient will need to reschedule her appointment due to the indication being postmenopausal bleeding.  Chart to nursing supervisor.   [place]

## 2018-05-18 ENCOUNTER — Other Ambulatory Visit: Payer: Self-pay

## 2018-05-18 ENCOUNTER — Other Ambulatory Visit: Payer: Medicare Other | Admitting: Obstetrics and Gynecology

## 2018-05-22 NOTE — Telephone Encounter (Signed)
Call placed to follow up in regards to re-scheduling sonohysterogram and endometrial biopsy. Patient declines to re-schedule, until insurance issue resolved, see account note for details. Patient, adds she is no longer bleeding. Patient advises she will follow up once insurance concerns are resolved.   Forwarding to Dr Quincy Simmonds for review  cc: Lamont Snowball, RN

## 2018-05-24 DIAGNOSIS — N200 Calculus of kidney: Secondary | ICD-10-CM

## 2018-05-24 HISTORY — DX: Calculus of kidney: N20.0

## 2018-05-31 ENCOUNTER — Encounter: Payer: PRIVATE HEALTH INSURANCE | Admitting: Family Medicine

## 2018-06-05 ENCOUNTER — Other Ambulatory Visit: Payer: Self-pay | Admitting: Family Medicine

## 2018-06-20 NOTE — Telephone Encounter (Signed)
Late entry ( Epic down during documentation)  Patient returned call. States was out of town and had been insurance issue.  Currently sick but will schedule for next week.   [facility]GM with possible endo biopsy scheduled for  06-29-2018. Advised to take Motrin 800 mg one hour prior with food.   Encounter closed.

## 2018-06-20 NOTE — Telephone Encounter (Signed)
Follow-up call to patient. Per ROI, can leave message on voice mail which has name confirmation.  Left message calling to follow-up regarding recommendation evaluation for abnormal bleeding. Very important that we proceed with this and will work with her to facilitate appointment. Requested call back to nursing supervisor, Gay Filler.

## 2018-06-20 NOTE — Telephone Encounter (Signed)
Please contact patient back to reschedule her appointment.  At a minimum, she needs a pelvic ultrasound.  The need for sonohysterogram and endometrial biopsy would depend on the findings.

## 2018-06-21 ENCOUNTER — Encounter: Payer: Medicare Other | Admitting: Family Medicine

## 2018-06-21 ENCOUNTER — Encounter: Payer: Self-pay | Admitting: Physician Assistant

## 2018-06-21 ENCOUNTER — Ambulatory Visit (INDEPENDENT_AMBULATORY_CARE_PROVIDER_SITE_OTHER): Payer: Medicare Other | Admitting: Physician Assistant

## 2018-06-21 VITALS — BP 126/68 | HR 69 | Temp 98.6°F | Ht 63.75 in | Wt 133.4 lb

## 2018-06-21 DIAGNOSIS — R05 Cough: Secondary | ICD-10-CM | POA: Diagnosis not present

## 2018-06-21 DIAGNOSIS — R059 Cough, unspecified: Secondary | ICD-10-CM

## 2018-06-21 MED ORDER — AZITHROMYCIN 250 MG PO TABS: ORAL_TABLET | ORAL | 0 refills | Status: DC

## 2018-06-21 NOTE — Patient Instructions (Signed)
It was great to see you!  Use medication as prescribed: azithromycin  Push fluids and get plenty of rest. Please return if you are not improving as expected, or if you have high fevers (>101.5) or difficulty swallowing or worsening productive cough.  Call clinic with questions.  I hope you start feeling better soon!

## 2018-06-21 NOTE — Progress Notes (Signed)
Mary Leach is a 68 y.o. female here for a new problem.  I, [employer] as a Education administrator for [employer], PA.,have documented all relevant documentation on the behalf of Inda Coke, PA,as directed by  Inda Coke, PA while in the presence of Inda Coke, [place].   History of Present Illness:   Chief Complaint  Patient presents with  . URI    x 8 days     URI   This is a new problem. The current episode started in the past 7 days. The problem has been gradually worsening. Maximum temperature: fever and chills first day only  The fever has been present for less than 1 day. Associated symptoms include congestion, coughing, ear pain, headaches, a plugged ear sensation, rhinorrhea and sinus pain. Pertinent negatives include no chest pain, nausea, sore throat or vomiting. Associated symptoms comments: Cough productive yellow. . She has tried decongestant, increased fluids and sleep for the symptoms. The treatment provided no relief.   She was recently traveling to [facility] -- was there for 10 days. Husband is also sick with similar symptoms.  Past Medical History:  Diagnosis Date  . Allergic rhinitis   . Anxiety   . Asthma   . Bipolar disorder ([place])   . Celiac disease   . Depression   . Elevated liver function tests 2019  . Elevated testosterone level 2019  . Elevated vitamin B12 level 2019  . History of colonic polyps   . Ileus following gastrointestinal surgery ([place]) 10/18/2011  . Leukopenia   . Low TSH level 2019  . Malnutrition following gastrointestinal surgery 10/18/2011  . Osteoporosis 2018  . RBBB (right bundle branch block)      Social History   Socioeconomic History  . Marital status: Married    Spouse name: Not on file  . Number of children: Not on file  . Years of education: Not on file  . Highest education level: Not on file  Occupational History    Employer: [employer]  . Financial resource strain: Not on file  .  Food insecurity:    Worry: Not on file    Inability: Not on file  . Transportation needs:    Medical: Not on file    Non-medical: Not on file  Tobacco Use  . Smoking status: Never Smoker  . Smokeless tobacco: Never Used  Substance and Sexual Activity  . Alcohol use: Yes    Alcohol/week: 3.0 standard drinks    Types: 3 Standard drinks or equivalent per week    Comment: 1-3 glasses of wine a week or gluten free beer  . Drug use: No  . Sexual activity: Not Currently    Partners: Male    Birth control/protection: Post-menopausal  Lifestyle  . Physical activity:    Days per week: Not on file    Minutes per session: Not on file  . Stress: Not on file  Relationships  . Social connections:    Talks on phone: Not on file    Gets together: Not on file    Attends religious service: Not on file    Active member of club or organization: Not on file    Attends meetings of clubs or organizations: Not on file    Relationship status: Not on file  . Intimate partner violence:    Fear of current or ex partner: Not on file    Emotionally abused: Not on file    Physically abused: Not on file  Forced sexual activity: Not on file  Other Topics Concern  . Not on file  Social History Narrative   Married, lives with husband and son Jenny Reichmann.  [employer] ([employer]).  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    Past Surgical History:  Procedure Laterality Date  . COLON SURGERY    . COLONOSCOPY W/ POLYPECTOMY    . COLOSTOMY TAKEDOWN  05/29/2012   Procedure: LAPAROSCOPIC COLOSTOMY TAKEDOWN;  Surgeon: Odis Hollingshead, MD;  Location: [facility];  Service: General;  Laterality: N/A;  Laparoscopic Assisted Colostomy Closure  with proctoscopy  . CYSTECTOMY     LEFT FOOT, RIGHT HAND  . FOOT SURGERY    . LAPAROTOMY  10/07/2011   Procedure: EXPLORATORY LAPAROTOMY;  Surgeon: Odis Hollingshead, MD;  Location: [facility];  Service: General;  Laterality: N/A;  hartmann procedure and creation of colostomy  . NASAL  SEPTOPLASTY W/ TURBINOPLASTY Bilateral 11/11/2015   Procedure: SEPTOPLASTY BILATERAL TURBINATE  REDUCTION ;  Surgeon: Leta Baptist, MD;  Location: [place];  Service: ENT;  Laterality: Bilateral;  . NASAL SINUS SURGERY Bilateral 11/11/2015   Procedure: BILATERAL ENDOSCOPIC  MAXILLARY ANTROSTOMY;  Surgeon: Leta Baptist, MD;  Location: [place];  Service: ENT;  Laterality: Bilateral;  . TONSILLECTOMY AND ADENOIDECTOMY    . TUBAL LIGATION    . UPPER GASTROINTESTINAL ENDOSCOPY  01/19/12    Family History  Problem Relation Age of Onset  . Heart disease Father   . Osteoporosis Father   . Osteoporosis Mother   . Mental illness Paternal Aunt   . Skin cancer Maternal Grandmother   . Hypertension Maternal Grandfather   . Heart disease Paternal Grandfather   . Celiac disease Grandchild     Allergies  Allergen Reactions  . Augmentin [Amoxicillin-Pot Clavulanate] Diarrhea  . Other     Multiple food intolerances. Eggs, corn, potatoes, yeast, millett, buckwheat, sesame , soy.  Also observes a dairy and wheat free diet.  Marland Kitchen Prevnar [Pneumococcal 13-Val Conj Vacc]     Vaccine contains yeast   . Sulfonamide Derivatives Rash    Arms only.  . Tape Rash    Current Medications:   Current Outpatient Medications:  .  ADVAIR DISKUS 100-50 MCG/DOSE AEPB, Inhale 1 puff into the lungs 2 (two) times daily. , Disp: , Rfl:  .  ARMOUR THYROID 120 MG tablet, Take 1 tablet by mouth daily., Disp: , Rfl:  .  Azelastine-Fluticasone (DYMISTA) 137-50 MCG/ACT SUSP, Place 2 sprays into the nose daily. , Disp: , Rfl:  .  Biotin 5000 MCG CAPS, Take 1 capsule by mouth daily. , Disp: , Rfl:  .  cetirizine (ZYRTEC) 10 MG tablet, Take 10 mg by mouth every evening. , Disp: , Rfl:  .  CHELATED ZINC PO, Take 30 mg by mouth every evening. , Disp: , Rfl:  .  Cholecalciferol (VITAMIN D PO), Take 5,000 each by mouth daily. , Disp: , Rfl:  .  EPINEPHrine (EPI-PEN) 0.3 mg/0.3 mL DEVI, Inject 0.3 mg into the  muscle once. For previous allergies, maybe gluten, Disp: , Rfl:  .  estradiol (VIVELLE-DOT) 0.025 MG/24HR, , Disp: , Rfl:  .  ESTROGENS CONJUGATED PO, by Subdermal route. , Disp: , Rfl:  .  FLUoxetine (PROZAC) 10 MG capsule, TAKE 3 CAPSULES DAILY AS DIRECTED., Disp: 93 capsule, Rfl: 0 .  magnesium gluconate (MAGONATE) 500 MG tablet, Take 1,500 mg by mouth every evening. , Disp: , Rfl:  .  Multiple Minerals-Vitamins (NUTRA-SUPPORT BONE) CAPS, Take 1-2 tablets  by mouth See admin instructions. Take 1 tablet every morning and 2 tablets every night, Disp: , Rfl:  .  Nutritional Supplements (DHEA PO), Take 5 mg by mouth daily., Disp: , Rfl:  .  Omega-3 Fatty Acids (SALMON OIL PO), Take 1,300 mg by mouth every evening. , Disp: , Rfl:  .  OVER THE COUNTER MEDICATION, , Disp: , Rfl:  .  Pregnenolone POWD, Take 75 mg by mouth daily. , Disp: , Rfl:  .  Probiotic Product (PROBIOTIC DAILY PO), Take by mouth. metaflora IB, Disp: , Rfl:  .  Progesterone Micronized (PROGESTERONE PO), Take 300 mg by mouth every evening. , Disp: , Rfl:  .  UNABLE TO FIND, Med Name:  Testosterone pellets, Disp: , Rfl:  .  vitamin C (ASCORBIC ACID) 500 MG tablet, Take 500 mg by mouth daily., Disp: , Rfl:  .  VITAMIN K PO, Take 150 mcg by mouth daily., Disp: , Rfl:  .  azithromycin (ZITHROMAX) 250 MG tablet, Take two tablets on day 1, then one daily x 4 days, Disp: 6 tablet, Rfl: 0   Review of Systems:   Review of Systems  HENT: Positive for congestion, ear pain, rhinorrhea and sinus pain. Negative for sore throat.   Respiratory: Positive for cough.   Cardiovascular: Negative for chest pain.  Gastrointestinal: Negative for nausea and vomiting.  Neurological: Positive for headaches.    Vitals:   Vitals:   06/21/18 1300  BP: 126/68  Pulse: 69  Temp: 98.6 F (37 C)  TempSrc: Oral  SpO2: 98%  Weight: 133 lb 6.4 oz (60.5 kg)  Height: 5' 3.75" (1.619 m)     Body mass index is 23.08 kg/m.  Physical Exam:   Physical  Exam Vitals signs and nursing note reviewed.  Constitutional:      General: She is not in acute distress.    Appearance: She is well-developed. She is not ill-appearing or toxic-appearing.  HENT:     Head: Normocephalic and atraumatic.     Right Ear: Tympanic membrane, ear canal and external ear normal. Tympanic membrane is not erythematous, retracted or bulging.     Left Ear: Tympanic membrane, ear canal and external ear normal. Tympanic membrane is not erythematous, retracted or bulging.     Nose: Mucosal edema, congestion and rhinorrhea present.     Right Sinus: No maxillary sinus tenderness or frontal sinus tenderness.     Left Sinus: No maxillary sinus tenderness or frontal sinus tenderness.     Mouth/Throat:     Lips: Pink.     Mouth: Mucous membranes are moist.     Pharynx: Uvula midline. Posterior oropharyngeal erythema present.  Eyes:     General: Lids are normal.     Conjunctiva/sclera: Conjunctivae normal.  Neck:     Trachea: Trachea normal.  Cardiovascular:     Rate and Rhythm: Normal rate and regular rhythm.     Pulses: Normal pulses.     Heart sounds: Normal heart sounds, S1 normal and S2 normal.     Comments: No LE edema Pulmonary:     Effort: Pulmonary effort is normal.     Breath sounds: No decreased breath sounds, wheezing, rhonchi or rales.  Lymphadenopathy:     Cervical: No cervical adenopathy.  Skin:    General: Skin is warm and dry.  Neurological:     Mental Status: She is alert.     GCS: GCS eye subscore is 4. GCS verbal subscore is 5. GCS motor subscore is 6.  Psychiatric:        Speech: Speech normal.        Behavior: Behavior normal. Behavior is cooperative.      Assessment and Plan:   Devonda was seen today for uri.  Diagnoses and all orders for this visit:  Cough No red flags on exam.  Suspect bronchitis. Will initiate azithromycin per orders. Discussed taking medications as prescribed. Reviewed return precautions including worsening fever,  SOB, worsening cough or other concerns. Push fluids and rest. I recommend that patient follow-up if symptoms worsen or persist despite treatment x 7-10 days, sooner if needed.  Other orders -     azithromycin (ZITHROMAX) 250 MG tablet; Take two tablets on day 1, then one daily x 4 days  . Reviewed expectations re: course of current medical issues. . Discussed self-management of symptoms. . Outlined signs and symptoms indicating need for more acute intervention. . Patient verbalized understanding and all questions were answered. . See orders for this visit as documented in the electronic medical record. . Patient received an After-Visit Summary.  CMA or LPN served as scribe during this visit. History, Physical, and Plan performed by medical provider. The above documentation has been reviewed and is accurate and complete.  Inda Coke, PA-C

## 2018-06-29 ENCOUNTER — Other Ambulatory Visit: Payer: Self-pay | Admitting: Obstetrics and Gynecology

## 2018-06-29 ENCOUNTER — Ambulatory Visit (INDEPENDENT_AMBULATORY_CARE_PROVIDER_SITE_OTHER): Payer: Medicare Other | Admitting: Obstetrics and Gynecology

## 2018-06-29 ENCOUNTER — Encounter: Payer: Self-pay | Admitting: Obstetrics and Gynecology

## 2018-06-29 ENCOUNTER — Ambulatory Visit (INDEPENDENT_AMBULATORY_CARE_PROVIDER_SITE_OTHER): Payer: Medicare Other

## 2018-06-29 ENCOUNTER — Other Ambulatory Visit: Payer: Self-pay

## 2018-06-29 VITALS — BP 120/74 | HR 66 | Ht 63.75 in | Wt 134.0 lb

## 2018-06-29 DIAGNOSIS — N9489 Other specified conditions associated with female genital organs and menstrual cycle: Secondary | ICD-10-CM | POA: Diagnosis not present

## 2018-06-29 DIAGNOSIS — Z79890 Hormone replacement therapy (postmenopausal): Secondary | ICD-10-CM

## 2018-06-29 DIAGNOSIS — N950 Postmenopausal bleeding: Secondary | ICD-10-CM | POA: Diagnosis not present

## 2018-06-29 NOTE — Progress Notes (Addendum)
GYNECOLOGY  VISIT   HPI: 68 y.o.   Married  Caucasian  female   G2P2 with Patient's last menstrual period was 05/24/2006.   here for sonohysterogram and endometrial biopsy.   Patient has had postmenopausal bleeding around Thanksgiving on HRT prescribed through [facility].  She started her HRT about 5 - 8 years after menopause.  Patient is planning another pellet injection beginning of March.   GYNECOLOGIC HISTORY: Patient's last menstrual period was 05/24/2006. Contraception: Tubal/postmenopausal Menopausal hormone therapy:  Estrogen patch,progesterone, testosterone pellets Last mammogram:  09/27/17 BIRADS 1 negative/density b Last pap smear:  01-12-18 Neg, 12-26-14 Neg:Neg HR HPV        OB History    Gravida  2   Para  2   Term      Preterm      AB      Living  2     SAB      TAB      Ectopic      Multiple      Live Births                 Patient Active Problem List   Diagnosis Date Noted  . Postmenopausal bleeding 05/15/2018  . Hormone replacement therapy (HRT) 05/15/2018  . High risk medication use 04/03/2018  . Chronic maxillary sinusitis 04/03/2018  . Elevated testosterone level in female 04/03/2018  . Allergic conjunctivitis and rhinitis, bilateral 04/03/2018  . Celiac sprue 07/02/2015  . Allergic rhinitis due to pollen 12/18/2014  . H/O resection of large bowel 12/15/2013  . 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 Asthma 10/18/2011  . Rectal Perforation due to fecal impaction s/p Hartmann procedure 5/16 10/08/2011    Past Medical History:  Diagnosis Date  . Allergic rhinitis   . Anxiety   . Asthma   . Bipolar disorder ([place])   . Celiac disease   . Depression   . Elevated liver function tests 2019  . Elevated testosterone level 2019  . Elevated vitamin B12 level 2019  . History of colonic polyps   . Ileus following gastrointestinal surgery ([place]) 10/18/2011  . Leukopenia   . Low TSH level 2019  . Malnutrition  following gastrointestinal surgery 10/18/2011  . Osteoporosis 2018  . RBBB (right bundle branch block)     Past Surgical History:  Procedure Laterality Date  . COLON SURGERY    . COLONOSCOPY W/ POLYPECTOMY    . COLOSTOMY TAKEDOWN  05/29/2012   Procedure: LAPAROSCOPIC COLOSTOMY TAKEDOWN;  Surgeon: Odis Hollingshead, MD;  [facility];  Service: General;  Laterality: N/A;  Laparoscopic Assisted Colostomy Closure  with proctoscopy  . CYSTECTOMY     LEFT FOOT, RIGHT HAND  . FOOT SURGERY    . LAPAROTOMY  10/07/2011   Procedure: EXPLORATORY LAPAROTOMY;  Surgeon: Odis Hollingshead, MD;  Location: [facility];  Service: General;  Laterality: N/A;  hartmann procedure and creation of colostomy  . NASAL SEPTOPLASTY W/ TURBINOPLASTY Bilateral 11/11/2015   Procedure: SEPTOPLASTY BILATERAL TURBINATE  REDUCTION ;  Surgeon: Leta Baptist, MD;  Location: [place];  Service: ENT;  Laterality: Bilateral;  . NASAL SINUS SURGERY Bilateral 11/11/2015   Procedure: BILATERAL ENDOSCOPIC  MAXILLARY ANTROSTOMY;  Surgeon: Leta Baptist, MD;  Location: [place];  Service: ENT;  Laterality: Bilateral;  . TONSILLECTOMY AND ADENOIDECTOMY    . TUBAL LIGATION    . UPPER GASTROINTESTINAL ENDOSCOPY  01/19/12    Current Outpatient Medications  Medication  Sig Dispense Refill  . ADVAIR DISKUS 100-50 MCG/DOSE AEPB Inhale 1 puff into the lungs 2 (two) times daily.     Francia Greaves THYROID 120 MG tablet Take 1 tablet by mouth daily.    . Azelastine-Fluticasone (DYMISTA) 137-50 MCG/ACT SUSP Place 2 sprays into the nose daily.     Marland Kitchen azithromycin (ZITHROMAX) 250 MG tablet Take two tablets on day 1, then one daily x 4 days 6 tablet 0  . Biotin 5000 MCG CAPS Take 1 capsule by mouth daily.     . cetirizine (ZYRTEC) 10 MG tablet Take 10 mg by mouth every evening.     . CHELATED ZINC PO Take 30 mg by mouth every evening.     . Cholecalciferol (VITAMIN D PO) Take 5,000 each by mouth daily.     Marland Kitchen EPINEPHrine (EPI-PEN) 0.3  mg/0.3 mL DEVI Inject 0.3 mg into the muscle once. For previous allergies, maybe gluten    . estradiol (VIVELLE-DOT) 0.025 MG/24HR     . ESTROGENS CONJUGATED PO by Subdermal route.     Marland Kitchen FLUoxetine (PROZAC) 10 MG capsule TAKE 3 CAPSULES DAILY AS DIRECTED. 93 capsule 0  . magnesium gluconate (MAGONATE) 500 MG tablet Take 1,500 mg by mouth every evening.     . Multiple Minerals-Vitamins (NUTRA-SUPPORT BONE) CAPS Take 1-2 tablets by mouth See admin instructions. Take 1 tablet every morning and 2 tablets every night    . Nutritional Supplements (DHEA PO) Take 5 mg by mouth daily.    . Omega-3 Fatty Acids (SALMON OIL PO) Take 1,300 mg by mouth every evening.     Marland Kitchen OVER THE COUNTER MEDICATION     . Pregnenolone POWD Take 75 mg by mouth daily.     . Probiotic Product (PROBIOTIC DAILY PO) Take by mouth. metaflora IB    . Progesterone Micronized (PROGESTERONE PO) Take 400 mg by mouth every evening.     Marland Kitchen UNABLE TO FIND Med Name:  Testosterone pellets    . vitamin C (ASCORBIC ACID) 500 MG tablet Take 500 mg by mouth daily.    Marland Kitchen VITAMIN K PO Take 150 mcg by mouth daily.     No current facility-administered medications for this visit.      ALLERGIES: Augmentin [amoxicillin-pot clavulanate]; Other; Prevnar [pneumococcal 13-val conj vacc]; Sulfonamide derivatives; and Tape  Family History  Problem Relation Age of Onset  . Heart disease Father   . Osteoporosis Father   . Osteoporosis Mother   . Mental illness Paternal Aunt   . Skin cancer Maternal Grandmother   . Hypertension Maternal Grandfather   . Heart disease Paternal Grandfather   . Celiac disease Grandchild     Social History   Socioeconomic History  . Marital status: Married    Spouse name: Not on file  . Number of children: Not on file  . Years of education: Not on file  . Highest education level: Not on file  Occupational History    Employer: [employer]  . Financial resource strain: Not on file  . Food  insecurity:    Worry: Not on file    Inability: Not on file  . Transportation needs:    Medical: Not on file    Non-medical: Not on file  Tobacco Use  . Smoking status: Never Smoker  . Smokeless tobacco: Never Used  Substance and Sexual Activity  . Alcohol use: Yes    Alcohol/week: 3.0 standard drinks    Types: 3 Standard drinks or equivalent per  week    Comment: 1-3 glasses of wine a week or gluten free beer  . Drug use: No  . Sexual activity: Not Currently    Partners: Male    Birth control/protection: Post-menopausal  Lifestyle  . Physical activity:    Days per week: Not on file    Minutes per session: Not on file  . Stress: Not on file  Relationships  . Social connections:    Talks on phone: Not on file    Gets together: Not on file    Attends religious service: Not on file    Active member of club or organization: Not on file    Attends meetings of clubs or organizations: Not on file    Relationship status: Not on file  . Intimate partner violence:    Fear of current or ex partner: Not on file    Emotionally abused: Not on file    Physically abused: Not on file    Forced sexual activity: Not on file  Other Topics Concern  . Not on file  Social History Narrative   Married, lives with husband and son Jenny Reichmann.  [employer] ([employer]).  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    Review of Systems  All other systems reviewed and are negative.   PHYSICAL EXAMINATION:    BP 120/74 (BP Location: Right Arm, Patient Position: Sitting, Cuff Size: Normal)   Pulse 66   Ht 5' 3.75" (1.619 m)   Wt 134 lb (60.8 kg)   LMP 05/24/2006   BMI 23.18 kg/m     General appearance: alert, cooperative and appears stated age   Pelvic US Uterus no masses.  EMS 4.52 mm. Normal right ovary.  Left ovary not seen.  No free fluid.   Sonohysterogram: Consent for procedure.  Sterile prep with Hibiclens Tenaculum to anterior cervical lip.  Cannula placed and sterile NS fluid  injected.  2 small filling defects noted in the LUS.  No complications.  Minimal EBL.  EMB: Consent for procedure. Sterile prep with Hibiclens.  Paracervical block with 1% lidocaine 6 cc, lot number   [id number], expiration 4/23. Tenaculum to anterior cervical lip. Pipelle passed to    6   cm twice.   Tissue to pathology.  Minimal EBL. No complications.   ASSESSMENT  Postmenopausal bleeding.  Endometrial masses.  On HRT including pellets of estrogen and testosterone.  Hx colon resection.   PLAN  Discussion of postmenopausal bleeding and the endometrial masses. Discussion of hysteroscopy with Myosure polypectomy, dilation and curettage.  Risks, benefits, and alternatives reviewed. Risks include but are not limited to bleeding, infection, damage to surrounding organs including uterine perforation requiring hospitalization and laparoscopy, pulmonary edema, reaction to anesthesia, DVT, PE, death, need for further treatment and surgery including hysterectomy or medical therapy.   Surgical expectations and recovery discussed.  Patient wishes to proceed pending the results of the EMB. ACOG HOs on hysteroscopy and dilation and curettage. We discussed potential discontinuation of her HRT.     An After Visit Summary was printed and given to the patient.  _25_____ minutes face to face time of which over 50% was spent in counseling.

## 2018-06-29 NOTE — Patient Instructions (Signed)

## 2018-07-01 ENCOUNTER — Encounter: Payer: Self-pay | Admitting: Physician Assistant

## 2018-07-03 ENCOUNTER — Telehealth: Payer: Self-pay | Admitting: Obstetrics and Gynecology

## 2018-07-03 NOTE — Telephone Encounter (Signed)
Patient was seen recently for a biopsy and said she was told to call if there was any bleeding. She called this morning to report she is having bleeding that started over the weekend and is worse today.  Last seen: 06/29/18

## 2018-07-03 NOTE — Telephone Encounter (Signed)
Spoke with patient. Patient is s/p EMB on 2/6. Reports cramping and spotting after EMB on 2/6, cramping has resolved, spotting has continued. Patient states flow is light, she notices mostly when wiping, placed a tampon this morning. Denies pain, vaginal odor, urinary symptoms, N/V, fever/chills.   Recommended patient not place anything in vagina until bleeding stops. Continue to monitor bleeding, return call to office if bleeding becomes heavy or any new symptoms develop. Advised our office will return call once pathology results return. Dr. Quincy Simmonds will review, I will return call if any additional recommendations. Patient verbalizes understanding and thankful for return call.   Routing to provider for final review. Patient is agreeable to disposition. Will close encounter.

## 2018-07-04 ENCOUNTER — Encounter: Payer: Self-pay | Admitting: Family Medicine

## 2018-07-04 ENCOUNTER — Telehealth: Payer: Self-pay | Admitting: *Deleted

## 2018-07-04 ENCOUNTER — Ambulatory Visit: Payer: PRIVATE HEALTH INSURANCE | Admitting: Physician Assistant

## 2018-07-04 ENCOUNTER — Ambulatory Visit (INDEPENDENT_AMBULATORY_CARE_PROVIDER_SITE_OTHER): Payer: Medicare Other | Admitting: Family Medicine

## 2018-07-04 VITALS — BP 90/60 | HR 71 | Temp 99.5°F | Ht 63.75 in | Wt 134.2 lb

## 2018-07-04 DIAGNOSIS — J320 Chronic maxillary sinusitis: Secondary | ICD-10-CM

## 2018-07-04 DIAGNOSIS — H1013 Acute atopic conjunctivitis, bilateral: Secondary | ICD-10-CM

## 2018-07-04 DIAGNOSIS — J309 Allergic rhinitis, unspecified: Secondary | ICD-10-CM

## 2018-07-04 MED ORDER — LEVOFLOXACIN 500 MG PO TABS: 500.0000 mg | ORAL_TABLET | Freq: Every day | ORAL | 0 refills | Status: DC

## 2018-07-04 NOTE — Telephone Encounter (Signed)
Routing to S. Orvan Seen, RN   Notes recorded by Burnice Logan, RN on 07/04/2018 at 12:43 PM EST Spoke with patient, advised as seen below per Dr. Quincy Simmonds. Patient would like to discuss further with her husband before proceeding. Also has some additional questions regarding scheduling of surgery, requesting return call. Advised I will forward message to S. Orvan Seen, RN for return call. Patient verbalizes understanding and is agreeable.

## 2018-07-04 NOTE — Progress Notes (Signed)
Mary Leach is a 68 y.o. female here for an acute visit.  History of Present Illness:   I acted as a Education administrator for [employer], DO [employer], LPN  Sinus Problem  This is a recurrent problem. The current episode started more than 1 month ago. The problem has been waxing and waning since onset. Maximum temperature: Saturday evening, did not take temp. The fever has been present for less than 1 day. Her pain is at a severity of 2/10. The pain is mild. Associated symptoms include chills, congestion, coughing (expectorating pale yellow), ear pain (off and on), headaches, shortness of breath (at night) and sinus pressure. Pertinent negatives include no hoarse voice, neck pain or sore throat. (Yellow Nasal drainage, post nasal drip, facial pain) Past treatments include antibiotics, spray decongestants and saline sprays (Nyquil and Dayquil, using Humidifier at night). The treatment provided mild relief.   PMHx, SurgHx, SocialHx, Medications, and Allergies were reviewed in the Visit Navigator and updated as appropriate.  Current Medications   .  ADVAIR DISKUS 100-50 MCG/DOSE AEPB, Inhale 1 puff into the lungs 2 (two) times daily. , Disp: , Rfl:  .  ARMOUR THYROID 120 MG tablet, Take 1 tablet by mouth daily., Disp: , Rfl:  .  Azelastine-Fluticasone (DYMISTA) 137-50 MCG/ACT SUSP, Place 2 sprays into the nose daily. , Disp: , Rfl:  .  Biotin 5000 MCG CAPS, Take 1 capsule by mouth daily. , Disp: , Rfl:  .  cetirizine (ZYRTEC) 10 MG tablet, Take 10 mg by mouth every evening. , Disp: , Rfl:  .  CHELATED ZINC PO, Take 30 mg by mouth every evening. , Disp: , Rfl:  .  Cholecalciferol (VITAMIN D PO), Take 5,000 each by mouth daily. , Disp: , Rfl:  .  EPINEPHrine (EPI-PEN) 0.3 mg/0.3 mL DEVI, Inject 0.3 mg into the muscle once. For previous allergies, maybe gluten, Disp: , Rfl:  .  estradiol (VIVELLE-DOT) 0.025 MG/24HR, , Disp: , Rfl:  .  ESTROGENS CONJUGATED PO, by Subdermal route. , Disp: , Rfl:  .   FLUoxetine (PROZAC) 10 MG capsule, TAKE 3 CAPSULES DAILY AS DIRECTED., Disp: 93 capsule, Rfl: 0 .  magnesium gluconate (MAGONATE) 500 MG tablet, Take 1,500 mg by mouth every evening. , Disp: , Rfl:  .  Multiple Minerals-Vitamins (NUTRA-SUPPORT BONE) CAPS, Take 1-2 tablets by mouth See admin instructions. Take 1 tablet every morning and 2 tablets every night, Disp: , Rfl:  .  Nutritional Supplements (DHEA PO), Take 5 mg by mouth daily., Disp: , Rfl:  .  Omega-3 Fatty Acids (SALMON OIL PO), Take 1,300 mg by mouth every evening. , Disp: , Rfl:  .  OVER THE COUNTER MEDICATION, , Disp: , Rfl:  .  Pregnenolone POWD, Take 75 mg by mouth daily. , Disp: , Rfl:  .  Probiotic Product (PROBIOTIC DAILY PO), Take by mouth. metaflora IB, Disp: , Rfl:  .  Progesterone Micronized (PROGESTERONE PO), Take 400 mg by mouth every evening. , Disp: , Rfl:  .  UNABLE TO FIND, Med Name:  Testosterone pellets, Disp: , Rfl:  .  vitamin C (ASCORBIC ACID) 500 MG tablet, Take 500 mg by mouth daily., Disp: , Rfl:  .  VITAMIN K PO, Take 150 mcg by mouth daily., Disp: , Rfl:    Allergies  Allergen Reactions  . Augmentin [Amoxicillin-Pot Clavulanate] Diarrhea  . Other     Multiple food intolerances. Eggs, corn, potatoes, yeast, millett, buckwheat, sesame , soy.  Also observes a dairy and  wheat free diet.  Marland Kitchen Prevnar [Pneumococcal 13-Val Conj Vacc]     Vaccine contains yeast   . Sulfonamide Derivatives Rash    Arms only.  . Tape Rash   Review of Systems   Pertinent items are noted in the HPI. Otherwise, ROS is negative.  Vitals   Vitals:   07/04/18 1130  BP: 90/60  Pulse: 71  Temp: 99.5 F (37.5 C)  TempSrc: Oral  SpO2: 98%  Weight: 134 lb 4 oz (60.9 kg)  Height: 5' 3.75" (1.619 m)     Body mass index is 23.23 kg/m.  Physical Exam   Physical Exam Vitals signs and nursing note reviewed.  HENT:     Head: Normocephalic and atraumatic.     Nose:     Right Sinus: Maxillary sinus tenderness present.      Left Sinus: Maxillary sinus tenderness present.  Eyes:     Pupils: Pupils are equal, round, and reactive to light.  Neck:     Musculoskeletal: Normal range of motion and neck supple.  Cardiovascular:     Rate and Rhythm: Normal rate and regular rhythm.     Heart sounds: Normal heart sounds.  Pulmonary:     Effort: Pulmonary effort is normal.  Abdominal:     Palpations: Abdomen is soft.  Skin:    General: Skin is warm.  Psychiatric:        Behavior: Behavior normal.     Assessment and Plan   Mary Leach was seen today for sinus problem.  Diagnoses and all orders for this visit:  Chronic maxillary sinusitis Comments: Will treat today. Encouraged her to go back to Allergist to restart injections.  Orders: -     levofloxacin (LEVAQUIN) 500 MG tablet; Take 1 tablet (500 mg total) by mouth daily.  Allergic conjunctivitis and rhinitis, bilateral   . Reviewed expectations re: course of current medical issues. . Discussed self-management of symptoms. . Outlined signs and symptoms indicating need for more acute intervention. . Patient verbalized understanding and all questions were answered. Marland Kitchen Health Maintenance issues including appropriate healthy diet, exercise, and smoking avoidance were discussed with patient. . See orders for this visit as documented in the electronic medical record. . Patient received an After Visit Summary.  CMA served as Education administrator during this visit. History, Physical, and Plan performed by medical provider. The above documentation has been reviewed and is accurate and complete. Briscoe Deutscher, D.O.  Briscoe Deutscher, DO [place], [place] [facility] 07/04/2018

## 2018-07-04 NOTE — Telephone Encounter (Signed)
Call to patient. Reviewed surgery date options and recovery expectations. Brief review of procedure provided and basic questions answered.  Patient requests to proceed on 07-25-2018. Advised will schedule and call her back.

## 2018-07-04 NOTE — Telephone Encounter (Signed)
-----   Message from Nunzio Cobbs, MD sent at 07/03/2018 10:48 AM EST ----- Please report results of EMB which showed very scant benign tissue.  Her sonohysterogram showed 1 or 2 small masses in the lower uterine segment.  I do recommend proceeding with a hysteroscopy with Myosure resection of endometrial masses, dilation and curettage. She is prepared for this possibility.   This will need precert and scheduling.  Her diagnoses are postmenopausal bleeding, HRT, endometrial masses.  [place] Malachi Pro

## 2018-07-07 NOTE — Telephone Encounter (Signed)
Detailed message left per DPR on mobile number advising patient to return call to review Surgery Information Form and to schedule post op appointments. Advised could ask to speak to [place].

## 2018-07-07 NOTE — Telephone Encounter (Signed)
Patient returned call. Surgery information form reviewed in detail with patient and she verbalized understanding. Patient scheduled for 2 week post op with Dr. Quincy Simmonds on 08-07-2018 at 1430. Patient agreeable to date and time of appointment. Patient aware [facility] will contact her to schedule pre op appointment at their facility. RN advised would mail a copy of Surgery Information Form as well as a [facility] Health Surgery Brochure. Patient agreeable. Address of file confirmed by patient.   Routing to provider and will close encounter.   CC Lamont Snowball, RN

## 2018-07-10 ENCOUNTER — Telehealth: Payer: Self-pay | Admitting: Obstetrics and Gynecology

## 2018-07-10 ENCOUNTER — Other Ambulatory Visit: Payer: Self-pay | Admitting: Family Medicine

## 2018-07-10 NOTE — Telephone Encounter (Signed)
Spoke with patient in regards to pre-surgery requirements for scheduled surgery. Patient understood information presented and is agreeable. Patient is aware this if the benefit information for our office and that the hospital will notify them separately with their benefits. Patient states she will confirm by 07/14/2018   cc: Lamont Snowball, RN

## 2018-07-18 ENCOUNTER — Encounter: Payer: Self-pay | Admitting: Family Medicine

## 2018-07-18 ENCOUNTER — Ambulatory Visit (INDEPENDENT_AMBULATORY_CARE_PROVIDER_SITE_OTHER): Payer: Medicare Other | Admitting: Family Medicine

## 2018-07-18 VITALS — BP 110/60 | HR 73 | Temp 98.3°F | Ht 64.0 in | Wt 133.0 lb

## 2018-07-18 DIAGNOSIS — Z13220 Encounter for screening for lipoid disorders: Secondary | ICD-10-CM

## 2018-07-18 DIAGNOSIS — R5383 Other fatigue: Secondary | ICD-10-CM

## 2018-07-18 DIAGNOSIS — L709 Acne, unspecified: Secondary | ICD-10-CM

## 2018-07-18 DIAGNOSIS — Z79890 Hormone replacement therapy (postmenopausal): Secondary | ICD-10-CM

## 2018-07-18 DIAGNOSIS — N950 Postmenopausal bleeding: Secondary | ICD-10-CM | POA: Diagnosis not present

## 2018-07-18 DIAGNOSIS — Z1239 Encounter for other screening for malignant neoplasm of breast: Secondary | ICD-10-CM

## 2018-07-18 LAB — CBC WITH DIFFERENTIAL/PLATELET
Basophils Absolute: 0 10*3/uL (ref 0.0–0.1)
Basophils Relative: 0.4 % (ref 0.0–3.0)
Eosinophils Absolute: 0 10*3/uL (ref 0.0–0.7)
Eosinophils Relative: 1 % (ref 0.0–5.0)
HCT: 35.6 % — ABNORMAL LOW (ref 36.0–46.0)
Hemoglobin: 12.1 g/dL (ref 12.0–15.0)
Lymphocytes Relative: 10 % — ABNORMAL LOW (ref 12.0–46.0)
Lymphs Abs: 0.4 10*3/uL — ABNORMAL LOW (ref 0.7–4.0)
MCHC: 34 g/dL (ref 30.0–36.0)
MCV: 95.8 fl (ref 78.0–100.0)
Monocytes Absolute: 0.3 10*3/uL (ref 0.1–1.0)
Monocytes Relative: 8.1 % (ref 3.0–12.0)
Neutro Abs: 3.3 10*3/uL (ref 1.4–7.7)
Neutrophils Relative %: 80.5 % — ABNORMAL HIGH (ref 43.0–77.0)
Platelets: 196 10*3/uL (ref 150.0–400.0)
RBC: 3.72 Mil/uL — ABNORMAL LOW (ref 3.87–5.11)
RDW: 13.1 % (ref 11.5–15.5)
WBC: 4.1 10*3/uL (ref 4.0–10.5)

## 2018-07-18 LAB — LIPID PANEL
Cholesterol: 157 mg/dL (ref 0–200)
HDL: 49.7 mg/dL (ref >39.00)
LDL Cholesterol: 92 mg/dL (ref 0–99)
NonHDL: 107.35
Total CHOL/HDL Ratio: 3
Triglycerides: 75 mg/dL (ref 0.0–149.0)
VLDL: 15 mg/dL (ref 0.0–40.0)

## 2018-07-18 LAB — COMPREHENSIVE METABOLIC PANEL
ALT: 16 U/L (ref 0–35)
AST: 22 U/L (ref 0–37)
Albumin: 4 g/dL (ref 3.5–5.2)
Alkaline Phosphatase: 79 U/L (ref 39–117)
BUN: 23 mg/dL (ref 6–23)
CO2: 27 mEq/L (ref 19–32)
Calcium: 9.3 mg/dL (ref 8.4–10.5)
Chloride: 99 mEq/L (ref 96–112)
Creatinine, Ser: 0.6 mg/dL (ref 0.40–1.20)
GFR: 99.72 mL/min (ref >60.00)
Glucose, Bld: 97 mg/dL (ref 70–99)
Potassium: 4.3 mEq/L (ref 3.5–5.1)
Sodium: 132 mEq/L — ABNORMAL LOW (ref 135–145)
Total Bilirubin: 0.4 mg/dL (ref 0.2–1.2)
Total Protein: 6.5 g/dL (ref 6.0–8.3)

## 2018-07-18 MED ORDER — TRETINOIN 0.05 % EX CREA: TOPICAL_CREAM | Freq: Every day | CUTANEOUS | 0 refills | Status: DC

## 2018-07-18 NOTE — Telephone Encounter (Signed)
Patient called on 07-12-2018 and confirmed surgery date.  Encounter closed.

## 2018-07-18 NOTE — Progress Notes (Addendum)
Subjective:    Mary Leach is a 68 y.o. female and is here for follow up. She has an upcoming D&C with GYN for postmenopausal bleeding. She will no longer be getting the HRT pellets. She will still be getting HRT oral. Changed her diet - citing [place] as her guide. Feeling better. Worried about menopausal vasomotor symptoms without the pellets. Some adult acne, worried about worsening. Hx of colonoscopy with Dr. Collene Mares - colon polyp.    Current Outpatient Medications:  .  ADVAIR DISKUS 100-50 MCG/DOSE AEPB, Inhale 1 puff into the lungs 2 (two) times daily. , Disp: , Rfl:  .  ARMOUR THYROID 120 MG tablet, Take 1 tablet by mouth daily., Disp: , Rfl:  .  Azelastine-Fluticasone (DYMISTA) 137-50 MCG/ACT SUSP, Place 2 sprays into the nose daily. , Disp: , Rfl:  .  Biotin 5000 MCG CAPS, Take 1 capsule by mouth daily. , Disp: , Rfl:  .  cetirizine (ZYRTEC) 10 MG tablet, Take 10 mg by mouth every evening. , Disp: , Rfl:  .  CHELATED ZINC PO, Take 30 mg by mouth every evening. , Disp: , Rfl:  .  Cholecalciferol (VITAMIN D PO), Take 5,000 each by mouth daily. , Disp: , Rfl:  .  EPINEPHrine (EPI-PEN) 0.3 mg/0.3 mL DEVI, Inject 0.3 mg into the muscle once. For previous allergies, maybe gluten, Disp: , Rfl:  .  estradiol (VIVELLE-DOT) 0.025 MG/24HR, , Disp: , Rfl:  .  FLUoxetine (PROZAC) 10 MG capsule, TAKE 3 CAPSULES DAILY AS DIRECTED., Disp: 93 capsule, Rfl: 6 .  magnesium gluconate (MAGONATE) 500 MG tablet, Take 1,500 mg by mouth every evening. , Disp: , Rfl:  .  Multiple Minerals-Vitamins (NUTRA-SUPPORT BONE) CAPS, Take 1-2 tablets by mouth See admin instructions. Take 1 tablet every morning and 2 tablets every night, Disp: , Rfl:  .  Nutritional Supplements (DHEA PO), Take 5 mg by mouth daily., Disp: , Rfl:  .  Omega-3 Fatty Acids (SALMON OIL PO), Take 1,300 mg by mouth every evening. , Disp: , Rfl:  .  OVER THE COUNTER MEDICATION, , Disp: , Rfl:  .  Pregnenolone POWD, Take 75 mg by mouth daily. ,  Disp: , Rfl:  .  Probiotic Product (PROBIOTIC DAILY PO), Take by mouth. metaflora IB, Disp: , Rfl:  .  Progesterone Micronized (PROGESTERONE PO), Take 400 mg by mouth every evening. , Disp: , Rfl:  .  UNABLE TO FIND, Med Name:  Testosterone pellets, Disp: , Rfl:  .  vitamin C (ASCORBIC ACID) 500 MG tablet, Take 500 mg by mouth daily., Disp: , Rfl:  .  VITAMIN K PO, Take 150 mcg by mouth daily., Disp: , Rfl:  .  tretinoin (RETIN-A) 0.05 % cream, Apply topically at bedtime., Disp: 45 g, Rfl: 0  PMHx, SurgHx, SocialHx, Medications, and Allergies were reviewed in the Visit Navigator and updated as appropriate.   Past Medical History:  Diagnosis Date  . Allergic rhinitis   . Anxiety   . Asthma   . Bipolar disorder ([place])   . Celiac disease   . Depression   . Elevated liver function tests 2019  . Elevated testosterone level 2019  . Elevated vitamin B12 level 2019  . History of colonic polyps   . Hypothyroidism   . Ileus following gastrointestinal surgery ([place]) 10/18/2011  . Leukopenia   . Low TSH level 2019  . Malnutrition following gastrointestinal surgery 10/18/2011  . Osteoporosis 2018  . RBBB (right bundle branch block)  Past Surgical History:  Procedure Laterality Date  . COLON SURGERY    . COLONOSCOPY W/ POLYPECTOMY    . COLOSTOMY TAKEDOWN  05/29/2012   Procedure: LAPAROSCOPIC COLOSTOMY TAKEDOWN;  Surgeon: Odis Hollingshead, MD;  Location: [facility];  Service: General;  Laterality: N/A;  Laparoscopic Assisted Colostomy Closure  with proctoscopy  . CYSTECTOMY     LEFT FOOT, RIGHT HAND  . FOOT SURGERY    . LAPAROTOMY  10/07/2011   Procedure: EXPLORATORY LAPAROTOMY;  Surgeon: Odis Hollingshead, MD;  Location: [facility];  Service: General;  Laterality: N/A;  hartmann procedure and creation of colostomy  . NASAL SEPTOPLASTY W/ TURBINOPLASTY Bilateral 11/11/2015   Procedure: SEPTOPLASTY BILATERAL TURBINATE  REDUCTION ;  Surgeon: Leta Baptist, MD;  Location: [place];   Service: ENT;  Laterality: Bilateral;  . NASAL SINUS SURGERY Bilateral 11/11/2015   Procedure: BILATERAL ENDOSCOPIC  MAXILLARY ANTROSTOMY;  Surgeon: Leta Baptist, MD;  Location: [facility];  Service: ENT;  Laterality: Bilateral;  . TONSILLECTOMY AND ADENOIDECTOMY    . TUBAL LIGATION    . UPPER GASTROINTESTINAL ENDOSCOPY  01/19/12     Family History  Problem Relation Age of Onset  . Heart disease Father   . Osteoporosis Father   . Osteoporosis Mother   . Mental illness Paternal Aunt   . Skin cancer Maternal Grandmother   . Hypertension Maternal Grandfather   . Heart disease Paternal Grandfather   . Celiac disease Grandchild     Social History   Tobacco Use  . Smoking status: Never Smoker  . Smokeless tobacco: Never Used  Substance Use Topics  . Alcohol use: Yes    Alcohol/week: 3.0 standard drinks    Types: 3 Standard drinks or equivalent per week    Comment: 1-3 glasses of wine a week or gluten free beer  . Drug use: No    Review of Systems:   Pertinent items are noted in the HPI. Otherwise, ROS is negative.  Objective:   BP 110/60   Pulse 73   Temp 98.3 F (36.8 C) (Oral)   Ht 5' 4"  (1.626 m)   Wt 133 lb (60.3 kg)   LMP 05/24/2006   SpO2 98%   BMI 22.83 kg/m   General appearance: alert, cooperative and appears stated age. Head: normocephalic, without obvious abnormality, atraumatic. Neck: no adenopathy, supple, symmetrical, trachea midline; thyroid not enlarged, symmetric, no tenderness/mass/nodules. Lungs: clear to auscultation bilaterally. Heart: regular rate and rhythm Abdomen: soft, non-tender; no masses,  no organomegaly. Extremities: extremities normal, atraumatic, no cyanosis or edema. Skin: skin color, texture, turgor normal, no rashes or lesions. Lymph: cervical, supraclavicular, and axillary nodes normal; no abnormal inguinal nodes palpated. Neurologic: grossly normal.  Assessment/Plan:   Mary Leach was seen today for annual  exam.  Diagnoses and all orders for this visit:  Adult acne Comments: Okay Retin-A. Will likely need prior authorization. Orders: -     tretinoin (RETIN-A) 0.05 % cream; Apply topically at bedtime.  Screening for breast cancer  Other fatigue Comments: Improved with diet.  Orders: -     CBC with Differential/Platelet -     Comprehensive metabolic panel  Screening for lipid disorders -     Lipid panel  Postmenopausal bleeding Comments: Continues. Will be having D&C soon.  Hormone replacement therapy (HRT) Comments: Through [facility].     Patient Counseling: [x]    Nutrition: Stressed importance of moderation in sodium/caffeine intake, saturated fat and cholesterol, caloric balance, sufficient intake of fresh  fruits, vegetables, fiber, calcium, iron, and 1 mg of folate supplement per day (for females capable of pregnancy).  [x]    Stressed the importance of regular exercise.   [x]    Substance Abuse: Discussed cessation/primary prevention of tobacco, alcohol, or other drug use; driving or other dangerous activities under the influence; availability of treatment for abuse.   [x]    Injury prevention: Discussed safety belts, safety helmets, smoke detector, smoking near bedding or upholstery.   [x]    Sexuality: Discussed sexually transmitted diseases, partner selection, use of condoms, avoidance of unintended pregnancy  and contraceptive alternatives.  [x]    Dental health: Discussed importance of regular tooth brushing, flossing, and dental visits.  [x]    Health maintenance and immunizations reviewed. Please refer to Health maintenance section.   Briscoe Deutscher, DO [place]

## 2018-07-19 ENCOUNTER — Encounter (HOSPITAL_BASED_OUTPATIENT_CLINIC_OR_DEPARTMENT_OTHER): Payer: Self-pay | Admitting: *Deleted

## 2018-07-19 ENCOUNTER — Other Ambulatory Visit: Payer: Self-pay

## 2018-07-19 NOTE — Progress Notes (Addendum)
Spoke to patient via telephone for pre op interview. NPO after MN. Patient to take Thyroid armour AM of surgery, and use Advair and Dymista AM of surgery. Arrival time 0900. Patient has gluten allergy informed patient she would need to bring snack for post op. Patient verbalized understanding.

## 2018-07-20 ENCOUNTER — Encounter: Payer: Self-pay | Admitting: Family Medicine

## 2018-07-20 DIAGNOSIS — L709 Acne, unspecified: Secondary | ICD-10-CM | POA: Insufficient documentation

## 2018-07-22 LAB — BASIC METABOLIC PANEL
BUN: 24 — AB (ref 4–21)
Creatinine: 0.6 (ref 0.5–1.1)
Glucose: 92
Potassium: 5.2 (ref 3.4–5.3)
Sodium: 134 — AB (ref 137–147)

## 2018-07-22 LAB — TSH: TSH: 0.03 — AB (ref 0.41–5.90)

## 2018-07-22 LAB — CBC AND DIFFERENTIAL
HCT: 37 (ref 36–46)
Hemoglobin: 12.4 (ref 12.0–16.0)
Neutrophils Absolute: 3
Platelets: 226 (ref 150–399)
WBC: 3.7

## 2018-07-22 LAB — VITAMIN D 25 HYDROXY (VIT D DEFICIENCY, FRACTURES): Vit D, 25-Hydroxy: 64.5

## 2018-07-22 LAB — HEPATIC FUNCTION PANEL
ALT: 19 (ref 7–35)
AST: 24 (ref 13–35)
Alkaline Phosphatase: 89 (ref 25–125)
Bilirubin, Total: 0.2

## 2018-07-22 LAB — VITAMIN B12: Vitamin B-12: 839

## 2018-07-23 NOTE — Addendum Note (Signed)
Addended by: Briscoe Deutscher R on: 07/23/2018 07:54 AM   Modules accepted: Level of Service

## 2018-07-24 ENCOUNTER — Telehealth: Payer: Self-pay | Admitting: Obstetrics and Gynecology

## 2018-07-24 NOTE — Telephone Encounter (Signed)
Spoke with patient.   1. Took 1 aleve today at 11am, ok to proceed with surgery?   2. When to stop estrogen patch and progesterone, due to change patch 3/3. Also has estrogen and testosterone pellets, due to be changed next Friday. Does not plan to have them replaced.   3. Asking if ok to take advair, dymista, nasal spray and eye drops in the morning? Advised patient to f/u with [facility] preop nurse for further instruction on these medications.   Advised patient I will review with Dr. Quincy Simmonds and return call with recommendations, patient agreeable.

## 2018-07-24 NOTE — Telephone Encounter (Signed)
Patient is scheduled for surgery tomorrow and has taken 2 aleve this morning. Should she be concerned about surgery?

## 2018-07-24 NOTE — Telephone Encounter (Signed)
Reviewed with Dr. Quincy Simmonds, call returned to patient.   1. OK to proceed with surgery as scheduled. Advised not to take any additional aleve.   2. Advised not to replace estrogen patch once removed, do not take any hormones day of surgery. Dr. Quincy Simmonds will review pathology and make recommendations on HRT at that time.   3. Patient agreeable to surgery time change, will arrive at [facility] on 3/3 10am, surgery at approximately 12pm.   Routing to provider for final review. Patient is agreeable to disposition. Will close encounter.

## 2018-07-25 ENCOUNTER — Encounter (HOSPITAL_BASED_OUTPATIENT_CLINIC_OR_DEPARTMENT_OTHER): Payer: Self-pay | Admitting: *Deleted

## 2018-07-25 ENCOUNTER — Ambulatory Visit (HOSPITAL_BASED_OUTPATIENT_CLINIC_OR_DEPARTMENT_OTHER): Payer: Medicare Other | Admitting: Anesthesiology

## 2018-07-25 ENCOUNTER — Ambulatory Visit (HOSPITAL_BASED_OUTPATIENT_CLINIC_OR_DEPARTMENT_OTHER)
Admission: RE | Admit: 2018-07-25 | Discharge: 2018-07-25 | Disposition: A | Discharge status: Home / Self Care | Payer: Medicare Other | Attending: Obstetrics and Gynecology | Admitting: Obstetrics and Gynecology

## 2018-07-25 ENCOUNTER — Encounter (HOSPITAL_BASED_OUTPATIENT_CLINIC_OR_DEPARTMENT_OTHER)
Admission: RE | Disposition: A | Discharge status: Home / Self Care | Payer: Self-pay | Source: Home / Self Care | Attending: Obstetrics and Gynecology

## 2018-07-25 DIAGNOSIS — E039 Hypothyroidism, unspecified: Secondary | ICD-10-CM | POA: Diagnosis not present

## 2018-07-25 DIAGNOSIS — Z882 Allergy status to sulfonamides status: Secondary | ICD-10-CM | POA: Diagnosis not present

## 2018-07-25 DIAGNOSIS — N950 Postmenopausal bleeding: Secondary | ICD-10-CM | POA: Insufficient documentation

## 2018-07-25 DIAGNOSIS — Z79899 Other long term (current) drug therapy: Secondary | ICD-10-CM | POA: Diagnosis not present

## 2018-07-25 DIAGNOSIS — Z880 Allergy status to penicillin: Secondary | ICD-10-CM | POA: Insufficient documentation

## 2018-07-25 DIAGNOSIS — Z881 Allergy status to other antibiotic agents status: Secondary | ICD-10-CM | POA: Diagnosis not present

## 2018-07-25 DIAGNOSIS — J45909 Unspecified asthma, uncomplicated: Secondary | ICD-10-CM | POA: Diagnosis not present

## 2018-07-25 DIAGNOSIS — N840 Polyp of corpus uteri: Secondary | ICD-10-CM | POA: Diagnosis not present

## 2018-07-25 DIAGNOSIS — F319 Bipolar disorder, unspecified: Secondary | ICD-10-CM | POA: Insufficient documentation

## 2018-07-25 DIAGNOSIS — Z79890 Hormone replacement therapy (postmenopausal): Secondary | ICD-10-CM | POA: Insufficient documentation

## 2018-07-25 DIAGNOSIS — F419 Anxiety disorder, unspecified: Secondary | ICD-10-CM | POA: Insufficient documentation

## 2018-07-25 DIAGNOSIS — N841 Polyp of cervix uteri: Secondary | ICD-10-CM | POA: Diagnosis not present

## 2018-07-25 DIAGNOSIS — Z888 Allergy status to other drugs, medicaments and biological substances status: Secondary | ICD-10-CM | POA: Insufficient documentation

## 2018-07-25 DIAGNOSIS — N9489 Other specified conditions associated with female genital organs and menstrual cycle: Secondary | ICD-10-CM | POA: Diagnosis not present

## 2018-07-25 HISTORY — DX: Hypothyroidism, unspecified: E03.9

## 2018-07-25 HISTORY — PX: DILATATION & CURETTAGE/HYSTEROSCOPY WITH MYOSURE: SHX6511

## 2018-07-25 SURGERY — DILATATION & CURETTAGE/HYSTEROSCOPY WITH MYOSURE
Anesthesia: General

## 2018-07-25 MED ORDER — PROPOFOL 10 MG/ML IV BOLUS
INTRAVENOUS | Status: AC
  Filled 2018-07-25: qty 40

## 2018-07-25 MED ORDER — ARTIFICIAL TEARS OPHTHALMIC OINT
TOPICAL_OINTMENT | OPHTHALMIC | Status: AC
  Filled 2018-07-25: qty 3.5

## 2018-07-25 MED ORDER — LIDOCAINE 2% (20 MG/ML) 5 ML SYRINGE
INTRAMUSCULAR | Status: DC | PRN
  Administered 2018-07-25: 60 mg via INTRAVENOUS

## 2018-07-25 MED ORDER — KETOROLAC TROMETHAMINE 30 MG/ML IJ SOLN
30.0000 mg | Freq: Once | INTRAMUSCULAR | Status: DC | PRN
  Filled 2018-07-25: qty 1

## 2018-07-25 MED ORDER — KETOROLAC TROMETHAMINE 30 MG/ML IJ SOLN
INTRAMUSCULAR | Status: DC | PRN
  Administered 2018-07-25: 30 mg via INTRAVENOUS

## 2018-07-25 MED ORDER — ACETAMINOPHEN 500 MG PO TABS
ORAL_TABLET | ORAL | Status: AC
  Filled 2018-07-25: qty 2

## 2018-07-25 MED ORDER — SILVER NITRATE-POT NITRATE 75-25 % EX MISC
CUTANEOUS | Status: DC | PRN
  Administered 2018-07-25: 1

## 2018-07-25 MED ORDER — ONDANSETRON HCL 4 MG/2ML IJ SOLN
INTRAMUSCULAR | Status: AC
  Filled 2018-07-25: qty 2

## 2018-07-25 MED ORDER — MIDAZOLAM HCL 5 MG/5ML IJ SOLN
INTRAMUSCULAR | Status: DC | PRN
  Administered 2018-07-25 (×2): 1 mg via INTRAVENOUS

## 2018-07-25 MED ORDER — KETOROLAC TROMETHAMINE 30 MG/ML IJ SOLN
INTRAMUSCULAR | Status: AC
  Filled 2018-07-25: qty 1

## 2018-07-25 MED ORDER — MIDAZOLAM HCL 2 MG/2ML IJ SOLN
INTRAMUSCULAR | Status: AC
  Filled 2018-07-25: qty 2

## 2018-07-25 MED ORDER — LIDOCAINE 2% (20 MG/ML) 5 ML SYRINGE
INTRAMUSCULAR | Status: AC
  Filled 2018-07-25: qty 5

## 2018-07-25 MED ORDER — FERRIC SUBSULFATE SOLN
Status: DC | PRN
  Administered 2018-07-25: 1

## 2018-07-25 MED ORDER — ONDANSETRON HCL 4 MG/2ML IJ SOLN
INTRAMUSCULAR | Status: DC | PRN
  Administered 2018-07-25: 4 mg via INTRAVENOUS

## 2018-07-25 MED ORDER — EPHEDRINE 5 MG/ML INJ
INTRAVENOUS | Status: AC
  Filled 2018-07-25: qty 10

## 2018-07-25 MED ORDER — SODIUM CHLORIDE 0.9 % IR SOLN
Status: DC | PRN
  Administered 2018-07-25: 1000 mL

## 2018-07-25 MED ORDER — FENTANYL CITRATE (PF) 100 MCG/2ML IJ SOLN
INTRAMUSCULAR | Status: AC
  Filled 2018-07-25: qty 2

## 2018-07-25 MED ORDER — FENTANYL CITRATE (PF) 100 MCG/2ML IJ SOLN
INTRAMUSCULAR | Status: DC | PRN
  Administered 2018-07-25 (×2): 25 ug via INTRAVENOUS

## 2018-07-25 MED ORDER — GLYCOPYRROLATE 0.2 MG/ML IJ SOLN
INTRAMUSCULAR | Status: DC | PRN
  Administered 2018-07-25: 0.2 mg via INTRAVENOUS

## 2018-07-25 MED ORDER — GLYCOPYRROLATE PF 0.2 MG/ML IJ SOSY
PREFILLED_SYRINGE | INTRAMUSCULAR | Status: AC
  Filled 2018-07-25: qty 1

## 2018-07-25 MED ORDER — LIDOCAINE HCL 1 % IJ SOLN
INTRAMUSCULAR | Status: DC | PRN
  Administered 2018-07-25: 10 mL

## 2018-07-25 MED ORDER — LACTATED RINGERS IV SOLN
INTRAVENOUS | Status: DC
  Administered 2018-07-25: 07:00:00 via INTRAVENOUS
  Filled 2018-07-25: qty 1000

## 2018-07-25 MED ORDER — LACTATED RINGERS IV SOLN
INTRAVENOUS | Status: DC
  Filled 2018-07-25: qty 1000

## 2018-07-25 MED ORDER — FENTANYL CITRATE (PF) 100 MCG/2ML IJ SOLN
25.0000 ug | INTRAMUSCULAR | Status: DC | PRN
  Filled 2018-07-25: qty 1

## 2018-07-25 MED ORDER — IBUPROFEN 800 MG PO TABS: 800.0000 mg | ORAL_TABLET | Freq: Three times a day (TID) | ORAL | 0 refills | Status: DC | PRN

## 2018-07-25 MED ORDER — DEXAMETHASONE SODIUM PHOSPHATE 10 MG/ML IJ SOLN
INTRAMUSCULAR | Status: DC | PRN
  Administered 2018-07-25: 5 mg via INTRAVENOUS

## 2018-07-25 MED ORDER — PROMETHAZINE HCL 25 MG/ML IJ SOLN
6.2500 mg | INTRAMUSCULAR | Status: DC | PRN
  Filled 2018-07-25: qty 1

## 2018-07-25 MED ORDER — ACETAMINOPHEN 325 MG PO TABS
ORAL_TABLET | ORAL | Status: DC | PRN
  Administered 2018-07-25: 1000 mg via ORAL

## 2018-07-25 MED ORDER — EPHEDRINE SULFATE 50 MG/ML IJ SOLN
INTRAMUSCULAR | Status: DC | PRN
  Administered 2018-07-25 (×2): 10 mg via INTRAVENOUS

## 2018-07-25 MED ORDER — PROPOFOL 10 MG/ML IV BOLUS
INTRAVENOUS | Status: DC | PRN
  Administered 2018-07-25: 130 mg via INTRAVENOUS
  Administered 2018-07-25: 70 mg via INTRAVENOUS

## 2018-07-25 MED ORDER — DEXAMETHASONE SODIUM PHOSPHATE 10 MG/ML IJ SOLN
INTRAMUSCULAR | Status: AC
  Filled 2018-07-25: qty 1

## 2018-07-25 SURGICAL SUPPLY — 18 items
CANISTER SUCT 3000ML PPV (MISCELLANEOUS) ×3 IMPLANT
CATH ROBINSON RED A/P 16FR (CATHETERS) ×3 IMPLANT
COVER WAND RF STERILE (DRAPES) ×3 IMPLANT
DEVICE MYOSURE LITE (MISCELLANEOUS) ×2 IMPLANT
DEVICE MYOSURE REACH (MISCELLANEOUS) IMPLANT
DILATOR CANAL MILEX (MISCELLANEOUS) IMPLANT
GAUZE 4X4 16PLY RFD (DISPOSABLE) ×3 IMPLANT
GLOVE BIO SURGEON STRL SZ 6.5 (GLOVE) ×2 IMPLANT
GLOVE BIO SURGEONS STRL SZ 6.5 (GLOVE) ×1
GOWN STRL REUS W/TWL LRG LVL3 (GOWN DISPOSABLE) ×3 IMPLANT
IV NS IRRIG 3000ML ARTHROMATIC (IV SOLUTION) ×3 IMPLANT
KIT PROCEDURE FLUENT (KITS) ×3 IMPLANT
MYOSURE XL FIBROID (MISCELLANEOUS)
PACK VAGINAL MINOR WOMEN LF (CUSTOM PROCEDURE TRAY) ×3 IMPLANT
PAD OB MATERNITY 4.3X12.25 (PERSONAL CARE ITEMS) ×3 IMPLANT
SEAL ROD LENS SCOPE MYOSURE (ABLATOR) ×3 IMPLANT
SYSTEM TISS REMOVAL MYOSURE XL (MISCELLANEOUS) IMPLANT
TOWEL OR 17X26 10 PK STRL BLUE (TOWEL DISPOSABLE) ×6 IMPLANT

## 2018-07-25 NOTE — Transfer of Care (Signed)
Immediate Anesthesia Transfer of Care Note  Patient: Mary Leach  Procedure(s) Performed: DILATATION & CURETTAGE/HYSTEROSCOPY WITH MYOSURE RESECTION OF ENDOMETRIAL AND ENDOCERVICAL POLYP (N/A )  Patient Location: PACU  Anesthesia Type:General  Level of Consciousness: awake, alert , oriented and patient cooperative  Airway & Oxygen Therapy: Patient Spontanous Breathing and Patient connected to nasal cannula oxygen  Post-op Assessment: Report given to RN and Post -op Vital signs reviewed and stable  Post vital signs: Reviewed and stable  Last Vitals:  Vitals Value Taken Time  BP    Temp    Pulse 121 07/25/2018  8:21 AM  Resp 13 07/25/2018  8:21 AM  SpO2 100 % 07/25/2018  8:21 AM  Vitals shown include unvalidated device data.  Last Pain:  Vitals:   07/25/18 0625  TempSrc:   PainSc: 5          Complications: No apparent anesthesia complications

## 2018-07-25 NOTE — Anesthesia Postprocedure Evaluation (Signed)
Anesthesia Post Note  Patient: Mary Leach  Procedure(s) Performed: DILATATION & CURETTAGE/HYSTEROSCOPY WITH MYOSURE RESECTION OF ENDOMETRIAL AND ENDOCERVICAL POLYP (N/A )     Patient location during evaluation: PACU Anesthesia Type: General Level of consciousness: awake and alert Pain management: pain level controlled Vital Signs Assessment: post-procedure vital signs reviewed and stable Respiratory status: spontaneous breathing, nonlabored ventilation, respiratory function stable and patient connected to nasal cannula oxygen Cardiovascular status: blood pressure returned to baseline and stable Postop Assessment: no apparent nausea or vomiting Anesthetic complications: no    Last Vitals:  Vitals:   07/25/18 0830 07/25/18 0845  BP: 128/67 126/68  Pulse: 94 91  Resp: 12 11  Temp:    SpO2: 99% 97%    Last Pain:  Vitals:   07/25/18 0845  TempSrc:   PainSc: 0-No pain                 Cherity Blickenstaff S

## 2018-07-25 NOTE — Anesthesia Preprocedure Evaluation (Signed)
Anesthesia Evaluation  Patient identified by MRN, date of birth, ID band Patient awake    Reviewed: Allergy & Precautions, NPO status , Patient's Chart, lab work & pertinent test results  Airway Mallampati: II  TM Distance: >3 FB Neck ROM: Full    Dental no notable dental hx.    Pulmonary neg pulmonary ROS,    Pulmonary exam normal breath sounds clear to auscultation       Cardiovascular negative cardio ROS Normal cardiovascular exam Rhythm:Regular Rate:Normal     Neuro/Psych Bipolar Disorder negative neurological ROS     GI/Hepatic negative GI ROS, Neg liver ROS,   Endo/Other  Hypothyroidism   Renal/GU negative Renal ROS  negative genitourinary   Musculoskeletal negative musculoskeletal ROS (+)   Abdominal   Peds negative pediatric ROS (+)  Hematology negative hematology ROS (+)   Anesthesia Other Findings   Reproductive/Obstetrics negative OB ROS                             Anesthesia Physical Anesthesia Plan  ASA: II  Anesthesia Plan: General   Post-op Pain Management:    Induction: Intravenous  PONV Risk Score and Plan: 3 and Ondansetron, Dexamethasone and Treatment may vary due to age or medical condition  Airway Management Planned: LMA  Additional Equipment:   Intra-op Plan:   Post-operative Plan: Extubation in OR  Informed Consent: I have reviewed the patients History and Physical, chart, labs and discussed the procedure including the risks, benefits and alternatives for the proposed anesthesia with the patient or authorized representative who has indicated his/her understanding and acceptance.     Dental advisory given  Plan Discussed with: CRNA and Surgeon  Anesthesia Plan Comments:         Anesthesia Quick Evaluation

## 2018-07-25 NOTE — H&P (Signed)
Office Visit   06/29/2018 [place] Silva, Everardo All, MD  Obstetrics and Gynecology   Postmenopausal bleeding +2 more  Dx   Sonohysterogram   ; Referred by Briscoe Deutscher, DO  Reason for Visit   Additional Documentation   Vitals:   BP 120/74 (BP Location: Right Arm, Patient Position: Sitting, Cuff Size: Normal)   Pulse 66   Ht 5' 3.75" (1.619 m)   Wt 60.8 kg   LMP 05/24/2006   BMI 23.18 kg/m   BSA 1.65 m     More Vitals   Flowsheets:   MEWS Score,   Anthropometrics     Encounter Info:   Billing Info,   History,   Allergies,   Detailed Report     All Notes   Progress Notes by Nunzio Cobbs, MD at 06/29/2018 10:30 AM  Author: Nunzio Cobbs, MD Author Type: Physician Filed: 07/02/2018 10:56 AM  Note Status: Addendum Cosign: Cosign Not Required Encounter Date: 06/29/2018  Editor: Nunzio Cobbs, MD (Physician)  Prior Versions: 1. Nunzio Cobbs, MD (Physician) at 07/02/2018 10:00 AM - Signed   2. Lowella Fairy, CMA (Certified Psychologist, sport and exercise) at 06/29/2018 11:15 AM - Sign when Signing Visit   3. Orion Crook, RDMS (Technician) at 06/29/2018 11:11 AM - Sign when Signing Visit    GYNECOLOGY  VISIT   HPI: 68 y.o.   Married  Caucasian  female   G2P2 with Patient's last menstrual period was 05/24/2006.   here for sonohysterogram and endometrial biopsy.   Patient has had postmenopausal bleeding around Thanksgiving on HRT prescribed through [facility].  She started her HRT about 5 - 8 years after menopause.  Patient is planning another pellet injection beginning of March.   GYNECOLOGIC HISTORY: Patient's last menstrual period was 05/24/2006. Contraception: Tubal/postmenopausal Menopausal hormone therapy:  Estrogen patch,progesterone, testosterone pellets Last mammogram: 09/27/17 BIRADS 1 negative/density b Last pap smear: 01-12-18 Neg, 12-26-14 Neg:Neg HR HPV                OB History     Gravida  2   Para  2   Term      Preterm      AB      Living  2     SAB      TAB      Ectopic      Multiple      Live Births                     Patient Active Problem List   Diagnosis Date Noted  . Postmenopausal bleeding 05/15/2018  . Hormone replacement therapy (HRT) 05/15/2018  . High risk medication use 04/03/2018  . Chronic maxillary sinusitis 04/03/2018  . Elevated testosterone level in female 04/03/2018  . Allergic conjunctivitis and rhinitis, bilateral 04/03/2018  . Celiac sprue 07/02/2015  . Allergic rhinitis due to pollen 12/18/2014  . H/O resection of large bowel 12/15/2013  . 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 Asthma 10/18/2011  . Rectal Perforation due to fecal impaction s/p Hartmann procedure 5/16 10/08/2011        Past Medical History:  Diagnosis Date  . Allergic rhinitis   . Anxiety   . Asthma   . Bipolar disorder ([place])   . Celiac disease   . Depression   . Elevated liver function tests 2019  . Elevated  testosterone level 2019  . Elevated vitamin B12 level 2019  . History of colonic polyps   . Ileus following gastrointestinal surgery ([place]) 10/18/2011  . Leukopenia   . Low TSH level 2019  . Malnutrition following gastrointestinal surgery 10/18/2011  . Osteoporosis 2018  . RBBB (right bundle branch block)          Past Surgical History:  Procedure Laterality Date  . COLON SURGERY    . COLONOSCOPY W/ POLYPECTOMY    . COLOSTOMY TAKEDOWN  05/29/2012   Procedure: LAPAROSCOPIC COLOSTOMY TAKEDOWN;  Surgeon: Odis Hollingshead, MD;  Location: [facility];  Service: General;  Laterality: N/A;  Laparoscopic Assisted Colostomy Closure  with proctoscopy  . CYSTECTOMY     LEFT FOOT, RIGHT HAND  . FOOT SURGERY    . LAPAROTOMY  10/07/2011   Procedure: EXPLORATORY LAPAROTOMY;  Surgeon: Odis Hollingshead, MD;  Location: [facility];  Service: General;  Laterality: N/A;  hartmann procedure and  creation of colostomy  . NASAL SEPTOPLASTY W/ TURBINOPLASTY Bilateral 11/11/2015   Procedure: SEPTOPLASTY BILATERAL TURBINATE  REDUCTION ;  Surgeon: Leta Baptist, MD;  Location: [place];  Service: ENT;  Laterality: Bilateral;  . NASAL SINUS SURGERY Bilateral 11/11/2015   Procedure: BILATERAL ENDOSCOPIC  MAXILLARY ANTROSTOMY;  Surgeon: Leta Baptist, MD;  Location: [place];  Service: ENT;  Laterality: Bilateral;  . TONSILLECTOMY AND ADENOIDECTOMY    . TUBAL LIGATION    . UPPER GASTROINTESTINAL ENDOSCOPY  01/19/12          Current Outpatient Medications  Medication Sig Dispense Refill  . ADVAIR DISKUS 100-50 MCG/DOSE AEPB Inhale 1 puff into the lungs 2 (two) times daily.     Francia Greaves THYROID 120 MG tablet Take 1 tablet by mouth daily.    . Azelastine-Fluticasone (DYMISTA) 137-50 MCG/ACT SUSP Place 2 sprays into the nose daily.     Marland Kitchen azithromycin (ZITHROMAX) 250 MG tablet Take two tablets on day 1, then one daily x 4 days 6 tablet 0  . Biotin 5000 MCG CAPS Take 1 capsule by mouth daily.     . cetirizine (ZYRTEC) 10 MG tablet Take 10 mg by mouth every evening.     . CHELATED ZINC PO Take 30 mg by mouth every evening.     . Cholecalciferol (VITAMIN D PO) Take 5,000 each by mouth daily.     Marland Kitchen EPINEPHrine (EPI-PEN) 0.3 mg/0.3 mL DEVI Inject 0.3 mg into the muscle once. For previous allergies, maybe gluten    . estradiol (VIVELLE-DOT) 0.025 MG/24HR     . ESTROGENS CONJUGATED PO by Subdermal route.     Marland Kitchen FLUoxetine (PROZAC) 10 MG capsule TAKE 3 CAPSULES DAILY AS DIRECTED. 93 capsule 0  . magnesium gluconate (MAGONATE) 500 MG tablet Take 1,500 mg by mouth every evening.     . Multiple Minerals-Vitamins (NUTRA-SUPPORT BONE) CAPS Take 1-2 tablets by mouth See admin instructions. Take 1 tablet every morning and 2 tablets every night    . Nutritional Supplements (DHEA PO) Take 5 mg by mouth daily.    . Omega-3 Fatty Acids (SALMON OIL PO) Take  1,300 mg by mouth every evening.     Marland Kitchen OVER THE COUNTER MEDICATION     . Pregnenolone POWD Take 75 mg by mouth daily.     . Probiotic Product (PROBIOTIC DAILY PO) Take by mouth. metaflora IB    . Progesterone Micronized (PROGESTERONE PO) Take 400 mg by mouth every evening.     Marland Kitchen UNABLE TO  FIND Med Name:  Testosterone pellets    . vitamin C (ASCORBIC ACID) 500 MG tablet Take 500 mg by mouth daily.    Marland Kitchen VITAMIN K PO Take 150 mcg by mouth daily.     No current facility-administered medications for this visit.      ALLERGIES: Augmentin [amoxicillin-pot clavulanate]; Other; Prevnar [pneumococcal 13-val conj vacc]; Sulfonamide derivatives; and Tape       Family History  Problem Relation Age of Onset  . Heart disease Father   . Osteoporosis Father   . Osteoporosis Mother   . Mental illness Paternal Aunt   . Skin cancer Maternal Grandmother   . Hypertension Maternal Grandfather   . Heart disease Paternal Grandfather   . Celiac disease Grandchild     Social History        Socioeconomic History  . Marital status: Married    Spouse name: Not on file  . Number of children: Not on file  . Years of education: Not on file  . Highest education level: Not on file  Occupational History    Employer: [employer]  . Financial resource strain: Not on file  . Food insecurity:    Worry: Not on file    Inability: Not on file  . Transportation needs:    Medical: Not on file    Non-medical: Not on file  Tobacco Use  . Smoking status: Never Smoker  . Smokeless tobacco: Never Used  Substance and Sexual Activity  . Alcohol use: Yes    Alcohol/week: 3.0 standard drinks    Types: 3 Standard drinks or equivalent per week    Comment: 1-3 glasses of wine a week or gluten free beer  . Drug use: No  . Sexual activity: Not Currently    Partners: Male    Birth control/protection: Post-menopausal  Lifestyle  . Physical  activity:    Days per week: Not on file    Minutes per session: Not on file  . Stress: Not on file  Relationships  . Social connections:    Talks on phone: Not on file    Gets together: Not on file    Attends religious service: Not on file    Active member of club or organization: Not on file    Attends meetings of clubs or organizations: Not on file    Relationship status: Not on file  . Intimate partner violence:    Fear of current or ex partner: Not on file    Emotionally abused: Not on file    Physically abused: Not on file    Forced sexual activity: Not on file  Other Topics Concern  . Not on file  Social History Narrative   Married, lives with husband and son Jenny Reichmann.  [employer] ([employer]).  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    Review of Systems  All other systems reviewed and are negative.   PHYSICAL EXAMINATION:    BP 120/74 (BP Location: Right Arm, Patient Position: Sitting, Cuff Size: Normal)   Pulse 66   Ht 5' 3.75" (1.619 m)   Wt 134 lb (60.8 kg)   LMP 05/24/2006   BMI 23.18 kg/m     General appearance: alert, cooperative and appears stated age   Pelvic US Uterus no masses.  EMS 4.52 mm. Normal right ovary.  Left ovary not seen.  No free fluid.   Sonohysterogram: Consent for procedure.  Sterile prep with Hibiclens Tenaculum to anterior cervical lip.  Cannula placed and sterile  NS fluid injected.  2 small filling defects noted in the LUS.  No complications.  Minimal EBL.  EMB: Consent for procedure. Sterile prep with Hibiclens.  Paracervical block with 1% lidocaine 6 cc, lot number   [id number], expiration 4/23. Tenaculum to anterior cervical lip. Pipelle passed to    6   cm twice.   Tissue to pathology.  Minimal EBL. No complications.   ASSESSMENT  Postmenopausal bleeding.  Endometrial masses.  On HRT including pellets of estrogen and testosterone.  Hx colon resection.   PLAN  Discussion of  postmenopausal bleeding and the endometrial masses. Discussion of hysteroscopy with Myosure polypectomy, dilation and curettage.  Risks, benefits, and alternatives reviewed. Risks include but are not limited to bleeding, infection, damage to surrounding organs including uterine perforation requiring hospitalization and laparoscopy, pulmonary edema, reaction to anesthesia, DVT, PE, death, need for further treatment and surgery including hysterectomy or medical therapy.   Surgical expectations and recovery discussed.  Patient wishes to proceed pending the results of the EMB. ACOG HOs on hysteroscopy and dilation and curettage. We discussed potential discontinuation of her HRT.     An After Visit Summary was printed and given to the patient.  _25_____ minutes face to face time of which over 50% was spent in counseling.

## 2018-07-25 NOTE — Progress Notes (Signed)
Update to History and Physical  Continues to have postmenopausal bleeding.  Her office EMB showed scanty benign tissue.  Stopped her HRT last hs.  She is due for another pellet placement within the next week. Patient examined.  OK to proceed with surgery.

## 2018-07-25 NOTE — Anesthesia Procedure Notes (Signed)
Procedure Name: LMA Insertion Date/Time: 07/25/2018 7:35 AM Performed by: Wanita Chamberlain, CRNA Pre-anesthesia Checklist: Patient identified, Suction available, Patient being monitored, Timeout performed and Emergency Drugs available Patient Re-evaluated:Patient Re-evaluated prior to induction Oxygen Delivery Method: Circle system utilized Preoxygenation: Pre-oxygenation with 100% oxygen Induction Type: IV induction Ventilation: Mask ventilation without difficulty LMA: LMA inserted LMA Size: 4.0 Number of attempts: 2 Placement Confirmation: breath sounds checked- equal and bilateral,  CO2 detector and positive ETCO2 Tube secured with: Tape Dental Injury: Teeth and Oropharynx as per pre-operative assessment

## 2018-07-25 NOTE — Discharge Instructions (Signed)
DISCHARGE INSTRUCTIONS: D&C / D&E The following instructions have been prepared to help you care for yourself upon your return home.   Personal hygiene:  Use sanitary pads for vaginal drainage, not tampons.  Shower the day after your procedure.  NO tub baths, pools or Jacuzzis for 2-3 weeks.  Wipe front to back after using the bathroom.  Activity and limitations:  Do NOT drive or operate any equipment for 24 hours. The effects of anesthesia are still present and drowsiness may result.  Do NOT rest in bed all day.  Walking is encouraged.  Walk up and down stairs slowly.  You may resume your normal activity in one to two days or as indicated by your physician.  Sexual activity: NO intercourse for at least 2 weeks after the procedure, or as indicated by your physician.  Diet: Eat a light meal as desired this evening. You may resume your usual diet tomorrow.  Return to work: You may resume your work activities in one to two days or as indicated by your doctor.  What to expect after your surgery: Expect to have vaginal bleeding/discharge for 2-3 days and spotting for up to 10 days. It is not unusual to have soreness for up to 1-2 weeks. You may have a slight burning sensation when you urinate for the first day. Mild cramps may continue for a couple of days. You may have a regular period in 2-6 weeks.  Call your doctor for any of the following:  Excessive vaginal bleeding, saturating and changing one pad every hour.  Inability to urinate 6 hours after discharge from hospital.  Pain not relieved by pain medication.  Fever of 100.4 F or greater.  Unusual vaginal discharge or odor.   Call for an appointment: as instructed   Patients signature: ______________________  Nurses signature ________________________  Support person's signature_______________________    Post Anesthesia Home Care Instructions  Activity: Get plenty of rest for the remainder of the day. A  responsible individual must stay with you for 24 hours following the procedure.  For the next 24 hours, DO NOT: -Drive a car -Paediatric nurse -Drink alcoholic beverages -Take any medication unless instructed by your physician -Make any legal decisions or sign important papers.  Meals: Start with liquid foods such as gelatin or soup. Progress to regular foods as tolerated. Avoid greasy, spicy, heavy foods. If nausea and/or vomiting occur, drink only clear liquids until the nausea and/or vomiting subsides. Call your physician if vomiting continues.  Special Instructions/Symptoms: Your throat may feel dry or sore from the anesthesia or the breathing tube placed in your throat during surgery. If this causes discomfort, gargle with warm salt water. The discomfort should disappear within 24 hours.  If you had a scopolamine patch placed behind your ear for the management of post- operative nausea and/or vomiting:  1. The medication in the patch is effective for 72 hours, after which it should be removed.  Wrap patch in a tissue and discard in the trash. Wash hands thoroughly with soap and water. 2. You may remove the patch earlier than 72 hours if you experience unpleasant side effects which may include dry mouth, dizziness or visual disturbances. 3. Avoid touching the patch. Wash your hands with soap and water after contact with the patch.     No advil, aleve, motrin, ibuprofen, naprosyn until 2 pm today

## 2018-07-25 NOTE — Op Note (Signed)
OPERATIVE REPORT   PREOPERATIVE DIAGNOSES:   Recurrent postmenopausal bleeding, hormone replacement therapy, endometrial masses.  POSTOPERATIVE DIAGNOSES:   Recurrent postmenopausal bleeding, hormone replacement therapy, endometrial masses.  PROCEDURE:  Hysteroscopy with dilation and curettage and Myosure resection of endometrial and endocervical masses.   SURGEON:  Lenard Galloway, MD  ANESTHESIA:  LMA, paracervical block with 10 mL of 1% lidocaine.  IV FLUIDS:   1000 cc LR.  EBL:  5 cc.  URINE OUTPUT:  25 cc.   NORMAL SALINE DEFICIT:   90 cc.   COMPLICATIONS:  None.  INDICATIONS FOR THE PROCEDURE:     The patient is a 68 year old Gravida 2, 25 2 Caucasian female who presents with postmenopausal bleeding on hormone replacement therapy.  Her bleeding has been recurrent.  Pelvic ultrasound documented no masses and her endometrium measured 4.52 mm.  Sonohysterogram demonstrated 2 endometrial masses.  Endometrial biopsy was scanty and benign. A plan is now made to proceed with a hysteroscopy with dilation and curettage and Myosure resection of endometrial masses after risks, benefits and alternatives were reviewed.  FINDINGS:  Exam under anesthesia revealed a small anteverted uterus.  No adnexal masses were noted. There was a large cervical ectropion. The uterus was sounded to 7 cm. Hysteroscopy showed a small mass of the left lower uterine segment and the left cervical wall.  Both tubal ostial regions were normal.  Endometrial currettings were scant.  SPECIMENS:  The endometrial polyp and the endocervical polyp were sent to pathology together, and endometrial curettings were sent to pathology as a separate specimen.  PROCEDURE IN DETAIL:  The patient was reidentified in the preoperative hold area.  She received TED hose and PAS stockings for DVT prophylaxis.  In the operating room, the patient was placed in the dorsal lithotomy position and then an LMA anesthetic was  introduced.  The patient's lower abdomen, vagina and perineum were sterilely prepped with Betadine and the  patient's bladder was catheterized of urine.  She was sterilly draped.  An exam under anesthesia was performed.  A speculum was placed inside the vagina and a single-tooth tenaculum was placed on the anterior cervical lip.  A paracervical block was performed with a total of 10 mL of 1% lidocaine plain.  The uterus was sounded. The cervix was dilated to a # 23 Pratt dilator.  The MyoSure hysteroscope was then inserted inside the uterine cavity under the continuous infusion of  normal saline solution.  Findings are as noted above.  The MyoSure hysteroscope was removed after the polyps were resected.  This specimen was  sent to pathology.  The sharp and serrated curettes were introduced into the  uterine cavity and the endometrium was curetted in all 4 quadrants.  A minimal  amount of endometrial curettings was obtained.   This specimen was sent to pathology.  The single-tooth tenaculum which had been placed on the anterior cervical lip was removed.  Silver nitrate and then Monsels was placed on the cervix Which was bleeding from the tenaculum site and the paracervical injection sites. Hemostasis was then good, and all of the vaginal instruments were removed.  The patient was awakened and escorted to the recovery room in stable condition after she was cleansed with Betadine.  There were no complications to the procedure.  All needle, instrument and sponge counts were correct.  Lenard Galloway, MD

## 2018-07-26 ENCOUNTER — Encounter (HOSPITAL_BASED_OUTPATIENT_CLINIC_OR_DEPARTMENT_OTHER): Payer: Self-pay | Admitting: Obstetrics and Gynecology

## 2018-08-07 ENCOUNTER — Ambulatory Visit (INDEPENDENT_AMBULATORY_CARE_PROVIDER_SITE_OTHER): Payer: Medicare Other | Admitting: Podiatry

## 2018-08-07 ENCOUNTER — Other Ambulatory Visit: Payer: Self-pay

## 2018-08-07 ENCOUNTER — Encounter: Payer: Self-pay | Admitting: Obstetrics and Gynecology

## 2018-08-07 ENCOUNTER — Ambulatory Visit (INDEPENDENT_AMBULATORY_CARE_PROVIDER_SITE_OTHER): Payer: Medicare Other | Admitting: Obstetrics and Gynecology

## 2018-08-07 VITALS — BP 100/68 | HR 60 | Resp 12 | Ht 63.75 in | Wt 132.0 lb

## 2018-08-07 DIAGNOSIS — M779 Enthesopathy, unspecified: Secondary | ICD-10-CM

## 2018-08-07 DIAGNOSIS — M778 Other enthesopathies, not elsewhere classified: Secondary | ICD-10-CM

## 2018-08-07 DIAGNOSIS — Z9889 Other specified postprocedural states: Secondary | ICD-10-CM

## 2018-08-07 DIAGNOSIS — M7742 Metatarsalgia, left foot: Secondary | ICD-10-CM

## 2018-08-07 NOTE — Progress Notes (Signed)
GYNECOLOGY  VISIT   HPI: 68 y.o.   Married  Caucasian  female   G2P2 with Patient's last menstrual period was 05/24/2006.   here for 2 week post op  DILATATION & CURETTAGE/HYSTEROSCOPY WITH MYOSURE RESECTION OF ENDOMETRIAL AND ENDOCERVICAL POLYP (N/A )   Final pathology - benign endocervical and endometrial polyps, benign endometrium.  No further bleeding.  Maybe took one Aleve after surgery.   Now off all hormones.   GYNECOLOGIC HISTORY: Patient's last menstrual period was 05/24/2006. Contraception:  Postmenopausal Menopausal hormone therapy:  none Last mammogram:  09/27/17 BIRADS 1 negative/density b Last pap smear:   01/12/18 Negative        OB History    Gravida  2   Para  2   Term      Preterm      AB      Living  2     SAB      TAB      Ectopic      Multiple      Live Births                 Patient Active Problem List   Diagnosis Date Noted  . Adult acne 07/20/2018  . Postmenopausal bleeding 05/15/2018  . Hormone replacement therapy (HRT) 05/15/2018  . High risk medication use 04/03/2018  . Chronic maxillary sinusitis 04/03/2018  . Elevated testosterone level in female 04/03/2018  . Allergic conjunctivitis and rhinitis, bilateral 04/03/2018  . Celiac sprue 07/02/2015  . Allergic rhinitis due to pollen 12/18/2014  . H/O resection of large bowel 12/15/2013  . 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 Asthma 10/18/2011  . Rectal Perforation due to fecal impaction s/p Hartmann procedure 5/16 10/08/2011    Past Medical History:  Diagnosis Date  . Allergic rhinitis   . Anxiety   . Asthma   . Bipolar disorder ([place])   . Celiac disease   . Depression   . Elevated liver function tests 2019  . Elevated testosterone level 2019  . Elevated vitamin B12 level 2019  . History of colonic polyps   . Hypothyroidism   . Ileus following gastrointestinal surgery ([place]) 10/18/2011  . Leukopenia   . Low TSH level 2019  . Malnutrition  following gastrointestinal surgery 10/18/2011  . Osteoporosis 2018  . RBBB (right bundle branch block)     Past Surgical History:  Procedure Laterality Date  . COLON SURGERY    . COLONOSCOPY W/ POLYPECTOMY    . COLOSTOMY TAKEDOWN  05/29/2012   Procedure: LAPAROSCOPIC COLOSTOMY TAKEDOWN;  Surgeon: Odis Hollingshead, MD;  Location: [facility];  Service: General;  Laterality: N/A;  Laparoscopic Assisted Colostomy Closure  with proctoscopy  . CYSTECTOMY     LEFT FOOT, RIGHT HAND  . DILATATION & CURETTAGE/HYSTEROSCOPY WITH MYOSURE N/A 07/25/2018   Procedure: DILATATION & CURETTAGE/HYSTEROSCOPY WITH MYOSURE RESECTION OF ENDOMETRIAL AND ENDOCERVICAL POLYP;  Surgeon: Nunzio Cobbs, MD;  Location: [facility];  Service: Gynecology;  Laterality: N/A;  . FOOT SURGERY    . LAPAROTOMY  10/07/2011   Procedure: EXPLORATORY LAPAROTOMY;  Surgeon: Odis Hollingshead, MD;  Location: [facility];  Service: General;  Laterality: N/A;  hartmann procedure and creation of colostomy  . NASAL SEPTOPLASTY W/ TURBINOPLASTY Bilateral 11/11/2015   Procedure: SEPTOPLASTY BILATERAL TURBINATE  REDUCTION ;  Surgeon: Leta Baptist, MD;  Location: [facility];  Service: ENT;  Laterality: Bilateral;  . NASAL SINUS SURGERY Bilateral 11/11/2015  Procedure: BILATERAL ENDOSCOPIC  MAXILLARY ANTROSTOMY;  Surgeon: Leta Baptist, MD;  Location: [facility];  Service: ENT;  Laterality: Bilateral;  . TONSILLECTOMY AND ADENOIDECTOMY    . TUBAL LIGATION    . UPPER GASTROINTESTINAL ENDOSCOPY  01/19/12    Current Outpatient Medications  Medication Sig Dispense Refill  . ADVAIR DISKUS 100-50 MCG/DOSE AEPB Inhale 1 puff into the lungs 2 (two) times daily.     Francia Greaves THYROID 120 MG tablet Take 1 tablet by mouth daily.    . Azelastine-Fluticasone (DYMISTA) 137-50 MCG/ACT SUSP Place 2 sprays into the nose daily.     . Biotin 5000 MCG CAPS Take 1 capsule by mouth daily.     . cetirizine (ZYRTEC) 10 MG tablet  Take 10 mg by mouth every evening.     . CHELATED ZINC PO Take 30 mg by mouth every evening.     . Cholecalciferol (VITAMIN D PO) Take 5,000 each by mouth daily.     Marland Kitchen EPINEPHrine (EPI-PEN) 0.3 mg/0.3 mL DEVI Inject 0.3 mg into the muscle once. For previous allergies, maybe gluten    . FLUoxetine (PROZAC) 10 MG capsule TAKE 3 CAPSULES DAILY AS DIRECTED. 93 capsule 6  . magnesium gluconate (MAGONATE) 500 MG tablet Take 1,500 mg by mouth every evening.     . Multiple Minerals-Vitamins (NUTRA-SUPPORT BONE) CAPS Take 1-2 tablets by mouth See admin instructions. Take 1 tablet every morning and 2 tablets every night    . Nutritional Supplements (DHEA PO) Take 5 mg by mouth daily.    . Omega-3 Fatty Acids (SALMON OIL PO) Take 1,300 mg by mouth every evening.     Marland Kitchen OVER THE COUNTER MEDICATION     . Pregnenolone POWD Take 75 mg by mouth daily.     . Probiotic Product (PROBIOTIC DAILY PO) Take by mouth. metaflora IB    . vitamin C (ASCORBIC ACID) 500 MG tablet Take 500 mg by mouth daily.    Marland Kitchen VITAMIN K PO Take 150 mcg by mouth daily.     No current facility-administered medications for this visit.      ALLERGIES: Augmentin [amoxicillin-pot clavulanate]; Chocolate; Other; Prevnar [pneumococcal 13-val conj vacc]; Sulfonamide derivatives; and Tape  Family History  Problem Relation Age of Onset  . Heart disease Father   . Osteoporosis Father   . Osteoporosis Mother   . Mental illness Paternal Aunt   . Skin cancer Maternal Grandmother   . Hypertension Maternal Grandfather   . Heart disease Paternal Grandfather   . Celiac disease Grandchild     Social History   Socioeconomic History  . Marital status: Married    Spouse name: Not on file  . Number of children: Not on file  . Years of education: Not on file  . Highest education level: Not on file  Occupational History    Employer: [employer]  . Financial resource strain: Not on file  . Food insecurity:    Worry: Not  on file    Inability: Not on file  . Transportation needs:    Medical: Not on file    Non-medical: Not on file  Tobacco Use  . Smoking status: Never Smoker  . Smokeless tobacco: Never Used  Substance and Sexual Activity  . Alcohol use: Yes    Alcohol/week: 3.0 standard drinks    Types: 3 Standard drinks or equivalent per week    Comment: 1-3 glasses of wine a week or gluten free beer  . Drug  use: No  . Sexual activity: Not Currently    Partners: Male    Birth control/protection: Post-menopausal  Lifestyle  . Physical activity:    Days per week: Not on file    Minutes per session: Not on file  . Stress: Not on file  Relationships  . Social connections:    Talks on phone: Not on file    Gets together: Not on file    Attends religious service: Not on file    Active member of club or organization: Not on file    Attends meetings of clubs or organizations: Not on file    Relationship status: Not on file  . Intimate partner violence:    Fear of current or ex partner: Not on file    Emotionally abused: Not on file    Physically abused: Not on file    Forced sexual activity: Not on file  Other Topics Concern  . Not on file  Social History Narrative   Married, lives with husband and son Jenny Reichmann.  [employer] ([employer]).  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    Review of Systems  Constitutional: Negative.   HENT: Negative.   Eyes: Negative.   Respiratory: Negative.   Cardiovascular: Negative.   Gastrointestinal: Negative.   Endocrine: Negative.   Genitourinary: Negative.   Musculoskeletal: Negative.   Skin: Negative.   Allergic/Immunologic: Negative.   Neurological: Negative.   Hematological: Negative.   Psychiatric/Behavioral: Negative.     PHYSICAL EXAMINATION:    BP 100/68 (BP Location: Right Arm, Patient Position: Sitting, Cuff Size: Normal)   Pulse 60   Resp 12   Ht 5' 3.75" (1.619 m)   Wt 132 lb (59.9 kg)   LMP 05/24/2006   BMI 22.84 kg/m     General  appearance: alert, cooperative and appears stated age    Pelvic: External genitalia:  no lesions              Urethra:  normal appearing urethra with no masses, tenderness or lesions              Bartholins and Skenes: normal                 Vagina: normal appearing vagina with normal color and discharge, no lesions              Cervix: no lesions.  Cervical ectropion.                Bimanual Exam:  Uterus:  normal size, contour, position, consistency, mobility, non-tender              Adnexa: no mass, fullness, tenderness            Chaperone was present for exam.  ASSESSMENT  Status post hysteroscopy with Myosure resection of endometrial and endocervical polyps, dilation and curettage.   PLAN  Surgical findings, procedure, and pathology report reviewed.  We discussed local vaginal estrogen if she finds she starts having vaginal dryness symptoms.  She understands there is potential effect on breast cancer.  She will call for any future vaginal bleeding.  Return for annual exam and prn.    An After Visit Summary was printed and given to the patient.

## 2018-08-08 ENCOUNTER — Encounter: Payer: Self-pay | Admitting: Physician Assistant

## 2018-08-08 LAB — TESTOSTERONE
CRP: 0.7
Copper: 129
DHEA-SO4: 134.3
Estradiol: 119.3
FSH: 18.9
Pregnenolone: 130
Progesterone: 6.8
Reverse T3, Serum: 23
T4,Free (Direct): 1.32
Testosterone: 76
Zinc: 103

## 2018-08-09 NOTE — Progress Notes (Signed)
   HPI: 68 year old female presenting today with a chief complaint of left plantar midfoot pain that began about 6 months ago. Walking barefoot increases the pain. Wearing shoes helps alleviate the pain. She has been taking OTC Aleve for treatment. She denies any known trauma or injury. Patient is here for further evaluation and treatment.   Past Medical History:  Diagnosis Date  . Allergic rhinitis   . Anxiety   . Asthma   . Bipolar disorder ([place])   . Celiac disease   . Depression   . Elevated liver function tests 2019  . Elevated testosterone level 2019  . Elevated vitamin B12 level 2019  . History of colonic polyps   . Hypothyroidism   . Ileus following gastrointestinal surgery ([place]) 10/18/2011  . Leukopenia   . Low TSH level 2019  . Malnutrition following gastrointestinal surgery 10/18/2011  . Osteoporosis 2018  . RBBB (right bundle branch block)      Physical Exam: General: The patient is alert and oriented x3 in no acute distress.  Dermatology: Skin is warm, dry and supple bilateral lower extremities. Negative for open lesions or macerations.  Vascular: Palpable pedal pulses bilaterally. No edema or erythema noted. Capillary refill within normal limits.  Neurological: Epicritic and protective threshold grossly intact bilaterally.   Musculoskeletal Exam: Pain with palpation noted to the metatarsal heads of the left foot. Range of motion within normal limits to all pedal and ankle joints bilateral. Muscle strength 5/5 in all groups bilateral.   Assessment: 1. Left foot metatarsalgia    Plan of Care:  1. Patient evaluated. 2. Patient's pain is alleviated completely with good shoes and arch supports.  3. Recommended good shoe gear with arch supports.  4. Continue taking OTC Aleve as needed.  5. Return to clinic as needed.      Edrick Kins, DPM [facility]  Dr. Edrick Kins, DPM    [address], [place] [address]                Office [phone]  Fax [phone]

## 2018-08-30 DIAGNOSIS — E039 Hypothyroidism, unspecified: Secondary | ICD-10-CM | POA: Insufficient documentation

## 2018-08-30 DIAGNOSIS — N951 Menopausal and female climacteric states: Secondary | ICD-10-CM | POA: Insufficient documentation

## 2018-08-30 DIAGNOSIS — Z8659 Personal history of other mental and behavioral disorders: Secondary | ICD-10-CM | POA: Insufficient documentation

## 2018-08-30 DIAGNOSIS — Z91018 Allergy to other foods: Secondary | ICD-10-CM | POA: Insufficient documentation

## 2018-09-12 ENCOUNTER — Ambulatory Visit (INDEPENDENT_AMBULATORY_CARE_PROVIDER_SITE_OTHER): Payer: Medicare Other | Admitting: Obstetrics and Gynecology

## 2018-09-12 ENCOUNTER — Telehealth: Payer: Self-pay | Admitting: Obstetrics and Gynecology

## 2018-09-12 ENCOUNTER — Other Ambulatory Visit: Payer: Self-pay

## 2018-09-12 ENCOUNTER — Encounter: Payer: Self-pay | Admitting: Obstetrics and Gynecology

## 2018-09-12 VITALS — BP 128/70 | HR 76 | Temp 97.8°F | Resp 14 | Ht 63.75 in | Wt 132.0 lb

## 2018-09-12 DIAGNOSIS — R319 Hematuria, unspecified: Secondary | ICD-10-CM | POA: Diagnosis not present

## 2018-09-12 DIAGNOSIS — N950 Postmenopausal bleeding: Secondary | ICD-10-CM

## 2018-09-12 LAB — POCT URINALYSIS DIPSTICK
Bilirubin, UA: NEGATIVE
Glucose, UA: NEGATIVE
Ketones, UA: NEGATIVE
Nitrite, UA: NEGATIVE
Protein, UA: NEGATIVE
Urobilinogen, UA: 0.2 E.U./dL
pH, UA: 8 (ref 5.0–8.0)

## 2018-09-12 NOTE — Patient Instructions (Signed)
Postmenopausal Bleeding  Postmenopausal bleeding is any bleeding that a woman has after she has entered into menopause. Menopause is the end of a woman's fertile years. After menopause, a woman no longer ovulates and does not have menstrual periods. Postmenopausal bleeding may have various causes, including:  Menopausal hormone therapy (MHT).  Endometrial atrophy. After menopause, low estrogen hormone levels cause the membrane that lines the uterus (endometrium) to become thinner. You may have bleeding as the endometrium thins.  Endometrial hyperplasia. This condition is caused by excess estrogen hormones and low levels of progesterone hormones. The excess estrogen causes the endometrium to thicken, which can lead to bleeding. In some cases, this can lead to cancer of the uterus.  Endometrial cancer.  Non-cancerous growths (polyps) on the endometrium, the lining of the uterus, or the cervix.  Uterine fibroids. These are non-cancerous growths in or around the uterus muscle tissue that can cause heavy bleeding. Any type of postmenopausal bleeding, even if it appears to be a typical menstrual period, should be evaluated by your health care provider. Treatment will depend on the cause of the bleeding. Follow these instructions at home:  Pay attention to any changes in your symptoms.  Avoid using tampons and douches as told by your health care provider.  Change your pads regularly.  Get regular pelvic exams and Pap tests.  Take iron supplements as told by your health care provider.  Take over-the-counter and prescription medicines only as told by your health care provider.  Keep all follow-up visits as told by your health care provider. This is important. Contact a health care provider if:  Your bleeding lasts more than 1 week.  You have abdominal pain.  You have bleeding with or after sexual intercourse.  You have bleeding that happens more often than every 3 weeks. Get help  right away if:  You have a fever, chills, headache, dizziness, muscle aches, and bleeding.  You have severe pain with bleeding.  You are passing blood clots.  You have heavy bleeding, need more than 1 pad an hour, and have never experienced this before.  You feel faint. Summary  Postmenopausal bleeding is any bleeding that a woman has after she has entered into menopause.  Postmenopausal bleeding may have various causes. Treatment will depend on the cause of the bleeding.  Any type of postmenopausal bleeding, even if it appears to be a typical menstrual period, should be evaluated by your health care provider.  Be sure to pay attention to any changes in your symptoms and keep all follow-up visits as told by your health care provider. This information is not intended to replace advice given to you by your health care provider. Make sure you discuss any questions you have with your health care provider. Document Released: 08/18/2005 Document Revised: 08/03/2016 Document Reviewed: 08/03/2016 Elsevier Interactive Patient Education  2019 [employer].

## 2018-09-12 NOTE — Progress Notes (Signed)
GYNECOLOGY  VISIT   HPI: 68 y.o.   Married  Caucasian  female   G2P2 with Patient's last menstrual period was 05/24/2006.   here for vaginal bleeding since Friday; patient noticed blood when she went to the restroom  Wearing a panty liner and changing twice a day. Not sure if she is having cramping.  Not sexually active and not using any medication in vagina.   No hormone therapy since Surgery on 07/25/18. No further testosterone pellets.  (her testosterone was due the first week of March. )  Had hysteroscopy with endometrial polyp removal on 07/25/18.  Pathology report showed benign endometrial polyp.   No dysuria or urgency.  Drinking a lot of water.   Straining to have a BM.  No blood in the stool.  Thinks she has a string of red clot with a BM which is separate from the stool.  States a hx of renal stone.   Urine: Large RBC, trace WBC, 8.0pH  GYNECOLOGIC HISTORY: Patient's last menstrual period was 05/24/2006. Contraception:  Postmenopausal Menopausal hormone therapy:  none Last mammogram:  09/27/17 BIRADS 1 negative/density b Last pap smear:   01/12/18 Negative        OB History    Gravida  2   Para  2   Term      Preterm      AB      Living  2     SAB      TAB      Ectopic      Multiple      Live Births                 Patient Active Problem List   Diagnosis Date Noted  . Adult acne 07/20/2018  . Postmenopausal bleeding 05/15/2018  . Hormone replacement therapy (HRT) 05/15/2018  . High risk medication use 04/03/2018  . Chronic maxillary sinusitis 04/03/2018  . Elevated testosterone level in female 04/03/2018  . Allergic conjunctivitis and rhinitis, bilateral 04/03/2018  . Celiac sprue 07/02/2015  . Allergic rhinitis due to pollen 12/18/2014  . H/O resection of large bowel 12/15/2013  . 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 Asthma 10/18/2011  . Rectal Perforation due to fecal impaction s/p Hartmann procedure 5/16  10/08/2011    Past Medical History:  Diagnosis Date  . Allergic rhinitis   . Anxiety   . Asthma   . Bipolar disorder ([place])   . Celiac disease   . Depression   . Elevated liver function tests 2019  . Elevated testosterone level 2019  . Elevated vitamin B12 level 2019  . History of colonic polyps   . Hypothyroidism   . Ileus following gastrointestinal surgery ([place]) 10/18/2011  . Leukopenia   . Low TSH level 2019  . Malnutrition following gastrointestinal surgery 10/18/2011  . Osteoporosis 2018  . RBBB (right bundle branch block)   . Renal stone 2020    Past Surgical History:  Procedure Laterality Date  . COLON SURGERY    . COLONOSCOPY W/ POLYPECTOMY    . COLOSTOMY TAKEDOWN  05/29/2012   Procedure: LAPAROSCOPIC COLOSTOMY TAKEDOWN;  Surgeon: Odis Hollingshead, MD;  Location: [facility];  Service: General;  Laterality: N/A;  Laparoscopic Assisted Colostomy Closure  with proctoscopy  . CYSTECTOMY     LEFT FOOT, RIGHT HAND  . DILATATION & CURETTAGE/HYSTEROSCOPY WITH MYOSURE N/A 07/25/2018   Procedure: DILATATION & CURETTAGE/HYSTEROSCOPY WITH MYOSURE RESECTION OF ENDOMETRIAL AND ENDOCERVICAL POLYP;  Surgeon: Karen Chafe  Lenard Galloway, MD;  Location: [facility];  Service: Gynecology;  Laterality: N/A;  . FOOT SURGERY    . LAPAROTOMY  10/07/2011   Procedure: EXPLORATORY LAPAROTOMY;  Surgeon: Odis Hollingshead, MD;  Location: [facility];  Service: General;  Laterality: N/A;  hartmann procedure and creation of colostomy  . NASAL SEPTOPLASTY W/ TURBINOPLASTY Bilateral 11/11/2015   Procedure: SEPTOPLASTY BILATERAL TURBINATE  REDUCTION ;  Surgeon: Leta Baptist, MD;  Location: [facility];  Service: ENT;  Laterality: Bilateral;  . NASAL SINUS SURGERY Bilateral 11/11/2015   Procedure: BILATERAL ENDOSCOPIC  MAXILLARY ANTROSTOMY;  Surgeon: Leta Baptist, MD;  Location: [facility];  Service: ENT;  Laterality: Bilateral;  . TONSILLECTOMY AND ADENOIDECTOMY    . TUBAL LIGATION     . UPPER GASTROINTESTINAL ENDOSCOPY  01/19/12    Current Outpatient Medications  Medication Sig Dispense Refill  . ADVAIR DISKUS 100-50 MCG/DOSE AEPB Inhale 1 puff into the lungs 2 (two) times daily.     Francia Greaves THYROID 120 MG tablet Take 1 tablet by mouth daily.    . Azelastine-Fluticasone (DYMISTA) 137-50 MCG/ACT SUSP Place 2 sprays into the nose daily.     . Biotin 5000 MCG CAPS Take 1 capsule by mouth daily.     . cetirizine (ZYRTEC) 10 MG tablet Take 10 mg by mouth every evening.     . CHELATED ZINC PO Take 30 mg by mouth every evening.     . Cholecalciferol (VITAMIN D PO) Take 5,000 each by mouth daily.     Marland Kitchen EPINEPHrine (EPI-PEN) 0.3 mg/0.3 mL DEVI Inject 0.3 mg into the muscle once. For previous allergies, maybe gluten    . FLUoxetine (PROZAC) 10 MG capsule TAKE 3 CAPSULES DAILY AS DIRECTED. 93 capsule 6  . magnesium gluconate (MAGONATE) 500 MG tablet Take 1,500 mg by mouth every evening.     . Multiple Minerals-Vitamins (NUTRA-SUPPORT BONE) CAPS Take 1-2 tablets by mouth See admin instructions. Take 1 tablet every morning and 2 tablets every night    . Nutritional Supplements (DHEA PO) Take 5 mg by mouth daily.    . Omega-3 Fatty Acids (SALMON OIL PO) Take 1,300 mg by mouth every evening.     Marland Kitchen OVER THE COUNTER MEDICATION     . Pregnenolone POWD Take 75 mg by mouth daily.     . Probiotic Product (PROBIOTIC DAILY PO) Take by mouth. metaflora IB    . vitamin C (ASCORBIC ACID) 500 MG tablet Take 500 mg by mouth daily.    Marland Kitchen VITAMIN K PO Take 150 mcg by mouth daily.     No current facility-administered medications for this visit.      ALLERGIES: Augmentin [amoxicillin-pot clavulanate]; Chocolate; Other; Prevnar [pneumococcal 13-val conj vacc]; Sulfonamide derivatives; and Tape  Family History  Problem Relation Age of Onset  . Heart disease Father   . Osteoporosis Father   . Osteoporosis Mother   . Mental illness Paternal Aunt   . Skin cancer Maternal Grandmother   .  Hypertension Maternal Grandfather   . Heart disease Paternal Grandfather   . Celiac disease Grandchild     Social History   Socioeconomic History  . Marital status: Married    Spouse name: Not on file  . Number of children: Not on file  . Years of education: Not on file  . Highest education level: Not on file  Occupational History    Employer: [employer]  . Financial resource strain: Not  on file  . Food insecurity:    Worry: Not on file    Inability: Not on file  . Transportation needs:    Medical: Not on file    Non-medical: Not on file  Tobacco Use  . Smoking status: Never Smoker  . Smokeless tobacco: Never Used  Substance and Sexual Activity  . Alcohol use: Yes    Alcohol/week: 3.0 standard drinks    Types: 3 Standard drinks or equivalent per week    Comment: 1-3 glasses of wine a week or gluten free beer  . Drug use: No  . Sexual activity: Not Currently    Partners: Male    Birth control/protection: Post-menopausal  Lifestyle  . Physical activity:    Days per week: Not on file    Minutes per session: Not on file  . Stress: Not on file  Relationships  . Social connections:    Talks on phone: Not on file    Gets together: Not on file    Attends religious service: Not on file    Active member of club or organization: Not on file    Attends meetings of clubs or organizations: Not on file    Relationship status: Not on file  . Intimate partner violence:    Fear of current or ex partner: Not on file    Emotionally abused: Not on file    Physically abused: Not on file    Forced sexual activity: Not on file  Other Topics Concern  . Not on file  Social History Narrative   Married, lives with husband and son Jenny Reichmann.  [employer] ([employer]).  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    Review of Systems  Constitutional: Negative.   HENT: Negative.   Eyes: Negative.   Respiratory: Negative.   Cardiovascular: Negative.   Gastrointestinal:  Negative.   Endocrine: Negative.   Genitourinary: Positive for vaginal bleeding.  Musculoskeletal: Negative.   Skin: Negative.   Allergic/Immunologic: Negative.   Neurological: Negative.   Hematological: Negative.   Psychiatric/Behavioral: Negative.     PHYSICAL EXAMINATION:    BP 128/70 (BP Location: Right Arm, Patient Position: Sitting, Cuff Size: Normal)   Pulse 76   Temp 97.8 F (36.6 C) (Oral)   Resp 14   Ht 5' 3.75" (1.619 m)   Wt 132 lb (59.9 kg)   LMP 05/24/2006   BMI 22.84 kg/m     General appearance: alert, cooperative and appears stated age   Pelvic: External genitalia:  no lesions              Urethra:  normal appearing urethra with no masses, tenderness or lesions              Bartholins and Skenes: normal                 Vagina: normal appearing vagina with normal color and discharge, no lesions.  Blood stained vagina.               Cervix:  Larger cervical ectropion.                 Bimanual Exam:  Uterus:  normal size, contour, position, consistency, mobility, non-tender              Adnexa: no mass, fullness, tenderness              Rectal exam: Yes.  .  Confirms.              Anus:  normal sphincter tone, no lesions  Chaperone was present for exam.  ASSESSMENT  Postmenopausal bleeding.  May be hormonal withdrawal.  Recent hysteroscopic polypectomy.  Hematuria looks like contamination from vagina.  PLAN  We discussed postmenopausal bleeding.  Return for sonohysterogram.  Urine culture.    An After Visit Summary was printed and given to the patient.  __15____ minutes face to face time of which over 50% was spent in counseling.

## 2018-09-12 NOTE — Telephone Encounter (Signed)
Please contact patient to schedule sonohysterogram for next week with me.

## 2018-09-12 NOTE — Telephone Encounter (Signed)
Spoke with patient.S/p D&C 07/25/18. Started spotting on 4/17, spotting has increased slightly each day, changing panty liner q12h. "Not sure about cramping, possibly mild". Denies fever/chills, N/V or urinary symptoms. Notices some "red flakes" in urine, unsure if coming form vagina. No longer using vaginal estrogen. Covid 19 screening negative. OV scheduled for today at 2:30pm with Dr. Quincy Simmonds.   Patient verbalizes understanding and is agreeable. Encounter closed.

## 2018-09-12 NOTE — Telephone Encounter (Signed)
Patient had D&C in March and has started bleeding again.

## 2018-09-13 LAB — URINE CULTURE

## 2018-09-13 NOTE — Telephone Encounter (Signed)
Returning a call to [employer].

## 2018-09-13 NOTE — Telephone Encounter (Signed)
Left message to call Celeste Candelas, RN at [facility] [phone].   

## 2018-09-13 NOTE — Telephone Encounter (Signed)
Spoke with patient. [facility] scheduled for 4/30 at 9am, consult at 9:30am with Dr. Quincy Simmonds. Order previously placed for precert.   Routing to provider for final review. Patient is agreeable to disposition. Will close encounter.   Cc: Thayer Ohm

## 2018-09-21 ENCOUNTER — Ambulatory Visit (INDEPENDENT_AMBULATORY_CARE_PROVIDER_SITE_OTHER): Payer: Medicare Other | Admitting: Obstetrics and Gynecology

## 2018-09-21 ENCOUNTER — Other Ambulatory Visit: Payer: Self-pay

## 2018-09-21 ENCOUNTER — Ambulatory Visit (INDEPENDENT_AMBULATORY_CARE_PROVIDER_SITE_OTHER): Payer: Medicare Other

## 2018-09-21 ENCOUNTER — Encounter: Payer: Self-pay | Admitting: Obstetrics and Gynecology

## 2018-09-21 VITALS — BP 110/60 | HR 68 | Resp 12 | Wt 131.2 lb

## 2018-09-21 DIAGNOSIS — N950 Postmenopausal bleeding: Secondary | ICD-10-CM | POA: Diagnosis not present

## 2018-09-21 NOTE — Progress Notes (Signed)
Encounter reviewed by Dr. Brook Amundson C. Silva.  

## 2018-09-21 NOTE — Progress Notes (Signed)
GYNECOLOGY  VISIT   HPI: 68 y.o.   Married  Caucasian  female   G2P2 with Patient's last menstrual period was 05/24/2006.   here for   Recurrent post menopausal bleeding.   Bled for about 14 days.   GYNECOLOGIC HISTORY: Patient's last menstrual period was 05/24/2006. Contraception: NA Menopausal hormone therapy:  NA.  Was on HRT and stopped after her hysteroscopy in March, 2020.  This included testosterone pellets.  Last mammogram:  09/27/17 BIRADS 1, density B Last pap smear:   01/12/18 - negative.        OB History    Gravida  2   Para  2   Term      Preterm      AB      Living  2     SAB      TAB      Ectopic      Multiple      Live Births                 Patient Active Problem List   Diagnosis Date Noted  . Adult acne 07/20/2018  . Postmenopausal bleeding 05/15/2018  . High risk medication use 04/03/2018  . Chronic maxillary sinusitis 04/03/2018  . Elevated testosterone level in female 04/03/2018  . Allergic conjunctivitis and rhinitis, bilateral 04/03/2018  . Celiac sprue 07/02/2015  . Allergic rhinitis due to pollen 12/18/2014  . H/O resection of large bowel 12/15/2013  . 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 Asthma 10/18/2011  . Rectal Perforation due to fecal impaction s/p Hartmann procedure 5/16 10/08/2011    Past Medical History:  Diagnosis Date  . Allergic rhinitis   . Anxiety   . Asthma   . Bipolar disorder ([place])   . Celiac disease   . Depression   . Elevated liver function tests 2019  . Elevated testosterone level 2019  . Elevated vitamin B12 level 2019  . History of colonic polyps   . Hypothyroidism   . Ileus following gastrointestinal surgery ([place]) 10/18/2011  . Leukopenia   . Low TSH level 2019  . Malnutrition following gastrointestinal surgery 10/18/2011  . Osteoporosis 2018  . RBBB (right bundle branch block)   . Renal stone 2020    Past Surgical History:  Procedure Laterality Date  . COLON  SURGERY    . COLONOSCOPY W/ POLYPECTOMY    . COLOSTOMY TAKEDOWN  05/29/2012   Procedure: LAPAROSCOPIC COLOSTOMY TAKEDOWN;  Surgeon: Odis Hollingshead, MD;  Location: [facility];  Service: General;  Laterality: N/A;  Laparoscopic Assisted Colostomy Closure  with proctoscopy  . CYSTECTOMY     LEFT FOOT, RIGHT HAND  . DILATATION & CURETTAGE/HYSTEROSCOPY WITH MYOSURE N/A 07/25/2018   Procedure: DILATATION & CURETTAGE/HYSTEROSCOPY WITH MYOSURE RESECTION OF ENDOMETRIAL AND ENDOCERVICAL POLYP;  Surgeon: Nunzio Cobbs, MD;  Location: [facility];  Service: Gynecology;  Laterality: N/A;  . FOOT SURGERY    . LAPAROTOMY  10/07/2011   Procedure: EXPLORATORY LAPAROTOMY;  Surgeon: Odis Hollingshead, MD;  Location: [facility];  Service: General;  Laterality: N/A;  hartmann procedure and creation of colostomy  . NASAL SEPTOPLASTY W/ TURBINOPLASTY Bilateral 11/11/2015   Procedure: SEPTOPLASTY BILATERAL TURBINATE  REDUCTION ;  Surgeon: Leta Baptist, MD;  Location: [facility];  Service: ENT;  Laterality: Bilateral;  . NASAL SINUS SURGERY Bilateral 11/11/2015   Procedure: BILATERAL ENDOSCOPIC  MAXILLARY ANTROSTOMY;  Surgeon: Leta Baptist, MD;  Location: [facility];  Service: ENT;  Laterality: Bilateral;  . TONSILLECTOMY AND ADENOIDECTOMY    . TUBAL LIGATION    . UPPER GASTROINTESTINAL ENDOSCOPY  01/19/12    Current Outpatient Medications  Medication Sig Dispense Refill  . ADVAIR DISKUS 100-50 MCG/DOSE AEPB Inhale 1 puff into the lungs 2 (two) times daily.     Francia Greaves THYROID 120 MG tablet Take 1 tablet by mouth daily.    . Azelastine-Fluticasone (DYMISTA) 137-50 MCG/ACT SUSP Place 2 sprays into the nose daily.     . Biotin 5000 MCG CAPS Take 1 capsule by mouth daily.     . cetirizine (ZYRTEC) 10 MG tablet Take 10 mg by mouth every evening.     . CHELATED ZINC PO Take 30 mg by mouth every evening.     . Cholecalciferol (VITAMIN D PO) Take 5,000 each by mouth daily.     Marland Kitchen  EPINEPHrine (EPI-PEN) 0.3 mg/0.3 mL DEVI Inject 0.3 mg into the muscle once. For previous allergies, maybe gluten    . FLUoxetine (PROZAC) 10 MG capsule TAKE 3 CAPSULES DAILY AS DIRECTED. 93 capsule 6  . magnesium gluconate (MAGONATE) 500 MG tablet Take 1,500 mg by mouth every evening.     . Multiple Minerals-Vitamins (NUTRA-SUPPORT BONE) CAPS Take 1-2 tablets by mouth See admin instructions. Take 1 tablet every morning and 2 tablets every night    . Nutritional Supplements (DHEA PO) Take 5 mg by mouth daily.    . Omega-3 Fatty Acids (SALMON OIL PO) Take 1,300 mg by mouth every evening.     Marland Kitchen OVER THE COUNTER MEDICATION     . Pregnenolone POWD Take 75 mg by mouth daily.     . Probiotic Product (PROBIOTIC DAILY PO) Take by mouth. metaflora IB    . vitamin C (ASCORBIC ACID) 500 MG tablet Take 500 mg by mouth daily.    Marland Kitchen VITAMIN K PO Take 150 mcg by mouth daily.     No current facility-administered medications for this visit.      ALLERGIES: Augmentin [amoxicillin-pot clavulanate]; Chocolate; Other; Prevnar [pneumococcal 13-val conj vacc]; Sulfonamide derivatives; and Tape  Family History  Problem Relation Age of Onset  . Heart disease Father   . Osteoporosis Father   . Osteoporosis Mother   . Mental illness Paternal Aunt   . Skin cancer Maternal Grandmother   . Hypertension Maternal Grandfather   . Heart disease Paternal Grandfather   . Celiac disease Grandchild     Social History   Socioeconomic History  . Marital status: Married    Spouse name: Not on file  . Number of children: Not on file  . Years of education: Not on file  . Highest education level: Not on file  Occupational History    Employer: [employer]  . Financial resource strain: Not on file  . Food insecurity:    Worry: Not on file    Inability: Not on file  . Transportation needs:    Medical: Not on file    Non-medical: Not on file  Tobacco Use  . Smoking status: Never Smoker  .  Smokeless tobacco: Never Used  Substance and Sexual Activity  . Alcohol use: Yes    Alcohol/week: 3.0 standard drinks    Types: 3 Standard drinks or equivalent per week    Comment: 1-3 glasses of wine a week or gluten free beer  . Drug use: No  . Sexual activity: Not Currently    Partners: Male  Birth control/protection: Post-menopausal  Lifestyle  . Physical activity:    Days per week: Not on file    Minutes per session: Not on file  . Stress: Not on file  Relationships  . Social connections:    Talks on phone: Not on file    Gets together: Not on file    Attends religious service: Not on file    Active member of club or organization: Not on file    Attends meetings of clubs or organizations: Not on file    Relationship status: Not on file  . Intimate partner violence:    Fear of current or ex partner: Not on file    Emotionally abused: Not on file    Physically abused: Not on file    Forced sexual activity: Not on file  Other Topics Concern  . Not on file  Social History Narrative   Married, lives with husband and son Jenny Reichmann.  [employer] ([employer]).  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    Review of Systems  See HPI.  PHYSICAL EXAMINATION:    BP 110/60 (BP Location: Left Arm, Patient Position: Sitting, Cuff Size: Normal)   Pulse 68   Resp 12   Wt 131 lb 3.2 oz (59.5 kg)   LMP 05/24/2006   BMI 22.70 kg/m     General appearance: alert, cooperative and appears stated age   Pelvic US Uterus no masses EMS 4.35 mm.  Ovaries not well seen.  No free fluid.  Sonohysterogram Consent for procedure.  Sterile prep with Hibiclens.  Cannula placed and NS injected in uterine cavity.  No filling defects.  No complications.  Minimal EBL.   Chaperone was present for exam.  ASSESSMENT  Recurrent postmenopausal bleeding.  Status post hysteroscopic resection of endometrial polyp - benign.  This is likely due to withdraw of her testosterone pellet.   PLAN  Post  sonohysterogram precautions given.  FU for annual exam and call for recurrent bleeding.  Questions invited and answered.   An After Visit Summary was printed and given to the patient.  __10____ minutes face to face time of which over 50% was spent in counseling.

## 2018-09-21 NOTE — Patient Instructions (Signed)
Please call for heavy bleeding, pelvic pain, or fever.   I am so pleased that everything looked reassuring on the ultrasound today!

## 2018-10-30 ENCOUNTER — Other Ambulatory Visit: Payer: Self-pay | Admitting: Obstetrics and Gynecology

## 2018-10-30 DIAGNOSIS — Z1231 Encounter for screening mammogram for malignant neoplasm of breast: Secondary | ICD-10-CM

## 2018-11-13 ENCOUNTER — Telehealth: Payer: Self-pay | Admitting: *Deleted

## 2018-11-13 ENCOUNTER — Ambulatory Visit (INDEPENDENT_AMBULATORY_CARE_PROVIDER_SITE_OTHER): Payer: Medicare Other | Admitting: Podiatry

## 2018-11-13 ENCOUNTER — Other Ambulatory Visit: Payer: Self-pay

## 2018-11-13 ENCOUNTER — Ambulatory Visit (INDEPENDENT_AMBULATORY_CARE_PROVIDER_SITE_OTHER): Payer: Medicare Other

## 2018-11-13 VITALS — Temp 97.5°F

## 2018-11-13 DIAGNOSIS — M2012 Hallux valgus (acquired), left foot: Secondary | ICD-10-CM

## 2018-11-13 DIAGNOSIS — M7742 Metatarsalgia, left foot: Secondary | ICD-10-CM | POA: Diagnosis not present

## 2018-11-13 DIAGNOSIS — M2042 Other hammer toe(s) (acquired), left foot: Secondary | ICD-10-CM

## 2018-11-13 NOTE — Telephone Encounter (Signed)
I called pt and she states she was looking at her foot and left 1st large bone behind the big toe looks very crooked, and wanted to know if there was anything else he could do while he was in there, and she would like to have surgery sooner than 12/08/2018, pt stated she was not having a lot of pain but was a nagging pain, and asked if she could take tylenol or aleve. I told pt if she only took regular strength tylenol as needed if she tolerated it would be fine, I would send the message to Dr. Amalia Hailey and the surgery coordinator.

## 2018-11-13 NOTE — Patient Instructions (Signed)
Pre-Operative Instructions  Congratulations, you have decided to take an important step towards improving your quality of life.  You can be assured that the doctors and staff at [facility] will be with you every step of the way.  Here are some important things you should know:  1. Plan to be at the surgery center/hospital at least 1 (one) hour prior to your scheduled time, unless otherwise directed by the surgical center/hospital staff.  You must have a responsible adult accompany you, remain during the surgery and drive you home.  Make sure you have directions to the surgical center/hospital to ensure you arrive on time. 2. If you are having surgery at [facility] or [facility], you will need a copy of your medical history and physical form from your family physician within one month prior to the date of surgery. We will give you a form for your primary physician to complete.  3. We make every effort to accommodate the date you request for surgery.  However, there are times where surgery dates or times have to be moved.  We will contact you as soon as possible if a change in schedule is required.   4. No aspirin/ibuprofen for one week before surgery.  If you are on aspirin, any non-steroidal anti-inflammatory medications (Mobic, Aleve, Ibuprofen) should not be taken seven (7) days prior to your surgery.  You make take Tylenol for pain prior to surgery.  5. Medications - If you are taking daily heart and blood pressure medications, seizure, reflux, allergy, asthma, anxiety, pain or diabetes medications, make sure you notify the surgery center/hospital before the day of surgery so they can tell you which medications you should take or avoid the day of surgery. 6. No food or drink after midnight the night before surgery unless directed otherwise by surgical center/hospital staff. 7. No alcoholic beverages 24-hours prior to surgery.  No smoking 24-hours prior or 24-hours after  surgery. 8. Wear loose pants or shorts. They should be loose enough to fit over bandages, boots, and casts. 9. Don't wear slip-on shoes. Sneakers are preferred. 10. Bring your boot with you to the surgery center/hospital.  Also bring crutches or a walker if your physician has prescribed it for you.  If you do not have this equipment, it will be provided for you after surgery. 11. If you have not been contacted by the surgery center/hospital by the day before your surgery, call to confirm the date and time of your surgery. 12. Leave-time from work may vary depending on the type of surgery you have.  Appropriate arrangements should be made prior to surgery with your employer. 13. Prescriptions will be provided immediately following surgery by your doctor.  Fill these as soon as possible after surgery and take the medication as directed. Pain medications will not be refilled on weekends and must be approved by the doctor. 14. Remove nail polish on the operative foot and avoid getting pedicures prior to surgery. 15. Wash the night before surgery.  The night before surgery wash the foot and leg well with water and the antibacterial soap provided. Be sure to pay special attention to beneath the toenails and in between the toes.  Wash for at least three (3) minutes. Rinse thoroughly with water and dry well with a towel.  Perform this wash unless told not to do so by your physician.  Enclosed: 1 Ice pack (please put in freezer the night before surgery)   1 Hibiclens skin cleaner     Pre-op instructions  If you have any questions regarding the instructions, please do not hesitate to call our office.  [place]: [address], [place], [place] [address] -- [phone]  [place]: [address] [place], [place] [address] -- [phone]  [place]: [address]  [place], [place] [address] -- [phone]  [place]: [address], [address], [place], [place] [address] -- [phone]  Website:  [email or web address] 

## 2018-11-13 NOTE — Telephone Encounter (Signed)
"  Sorry I missed your call.  I got your message about my surgery.  Thank you for calling to let me know.  I've written it down.  I guess someone will call and give me my time when it gets closer to the date huh?"  Yes, you will get a call from someone from the surgical center, a day or two prior to your surgery date and he(she) will give you your arrival time.

## 2018-11-13 NOTE — Telephone Encounter (Signed)
I called and left her a message that she was given the wrong date.  I informed her that we're scheduling her for December 07, 2018 instead of December 08, 2018.  I asked her to call and let me know if December 07, 2018 is an okay date for her appointment.

## 2018-11-13 NOTE — Telephone Encounter (Signed)
Pt states she has some questions about the x-rays.

## 2018-11-15 ENCOUNTER — Encounter: Payer: Self-pay | Admitting: Podiatry

## 2018-11-15 NOTE — Progress Notes (Signed)
   Subjective: 68 year old female presenting today for follow up evaluation of left foot pain. She expresses concern about a possible bunion on the left foot that is becoming increasingly more painful. She states she had a hammertoe repair of the 2nd toe on the left foot about 30 years ago and states she believes it is symptomatic again. She has modified her shoe gear and taken OTC antiinflammatories for treatment. Walking increases her pain. Patient is here for further evaluation and treatment.  Past Medical History:  Diagnosis Date  . Allergic rhinitis   . Anxiety   . Asthma   . Bipolar disorder ([place])   . Celiac disease   . Depression   . Elevated liver function tests 2019  . Elevated testosterone level 2019  . Elevated vitamin B12 level 2019  . History of colonic polyps   . Hypothyroidism   . Ileus following gastrointestinal surgery ([place]) 10/18/2011  . Leukopenia   . Low TSH level 2019  . Malnutrition following gastrointestinal surgery 10/18/2011  . Osteoporosis 2018  . RBBB (right bundle branch block)   . Renal stone 2020     Objective: Physical Exam General: The patient is alert and oriented x3 in no acute distress.  Dermatology: Skin is cool, dry and supple bilateral lower extremities. Negative for open lesions or macerations.  Vascular: Palpable pedal pulses bilaterally. No edema or erythema noted. Capillary refill within normal limits.  Neurological: Epicritic and protective threshold grossly intact bilaterally.   Musculoskeletal Exam: Clinical evidence of bunion deformity noted to the respective foot. There is moderate pain on palpation range of motion of the first MPJ. Lateral deviation of the hallux noted consistent with hallux abductovalgus. Hammertoe contracture also noted on clinical exam to the 2nd digit of the left foot. Symptomatic pain on palpation and range of motion also noted to the metatarsal phalangeal joints of the respective hammertoe digits.     Radiographic Exam: Increased intermetatarsal angle greater than 15 with a hallux abductus angle greater than 30 noted on AP view. Moderate degenerative changes noted within the first MPJ. Contracture deformity also noted to the interphalangeal joints and MPJs of the digits of the respective hammertoes. Elongated 2nd metatarsal of the left foot noted.   Assessment: 1. HAV w/ bunion deformity left 2. 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 Elongated 2nd metatarsal left     Plan of Care:  1. Patient was evaluated. X-Rays reviewed. 2. Today we discussed the conservative versus surgical management of the presenting pathology. The patient opts for surgical management. All possible complications and details of the procedure were explained. All patient questions were answered. No guarantees were expressed or implied. 3. Authorization for surgery was initiated today. Surgery will consist of Akin osteotomy left; Weil osteotomy with MPJ capsulotomy 2nd left.  4. CAM boot dispensed.  5. Return to clinic one week post op.    Edrick Kins, DPM [facility]  Dr. Edrick Kins, [address]                                        [place], [address] [address]                Office [phone]  Fax [phone]

## 2018-11-16 NOTE — Telephone Encounter (Signed)
Please advise 

## 2018-11-17 MED ORDER — MELOXICAM 15 MG PO TABS: 15.0000 mg | ORAL_TABLET | Freq: Every day | ORAL | 1 refills | Status: DC

## 2018-11-17 MED ORDER — TRAMADOL HCL 50 MG PO TABS: 50.0000 mg | ORAL_TABLET | Freq: Four times a day (QID) | ORAL | 0 refills | Status: AC | PRN

## 2018-11-17 NOTE — Telephone Encounter (Signed)
Spoke with patient on the phone today. All questions answered. No need for appt prior to surgery. - Dr. Amalia Hailey

## 2018-11-17 NOTE — Telephone Encounter (Signed)
I received a message from the nurse.  She said you wanted to add a procedure on your surgery.  "Well I don't know if I necessarily want to add anything on to it.  I think I need to see him for another consultation so I can discuss the surgery in more detail before I have the surgery."  I was going to suggest that to you because before a procedure can be added, you would have to see Dr. Amalia Hailey first.  "Can you schedule that appointment for me?"  I'll transfer you to a scheduler.

## 2018-11-17 NOTE — Addendum Note (Signed)
Addended by: Edrick Kins on: 11/17/2018 04:57 PM   Modules accepted: Orders

## 2018-11-17 NOTE — Telephone Encounter (Signed)
Called patient and answered all questions. - Dr. Amalia Hailey

## 2018-12-07 ENCOUNTER — Other Ambulatory Visit: Payer: Self-pay | Admitting: Podiatry

## 2018-12-07 DIAGNOSIS — M7742 Metatarsalgia, left foot: Secondary | ICD-10-CM | POA: Diagnosis not present

## 2018-12-07 DIAGNOSIS — M2012 Hallux valgus (acquired), left foot: Secondary | ICD-10-CM

## 2018-12-07 DIAGNOSIS — M2042 Other hammer toe(s) (acquired), left foot: Secondary | ICD-10-CM

## 2018-12-07 HISTORY — PX: FOOT SURGERY: SHX648

## 2018-12-07 MED ORDER — OXYCODONE-ACETAMINOPHEN 5-325 MG PO TABS: 1.0000 | ORAL_TABLET | Freq: Four times a day (QID) | ORAL | 0 refills | Status: DC | PRN

## 2018-12-07 NOTE — Progress Notes (Signed)
.  postop

## 2018-12-13 ENCOUNTER — Ambulatory Visit (INDEPENDENT_AMBULATORY_CARE_PROVIDER_SITE_OTHER): Payer: Medicare Other | Admitting: Podiatry

## 2018-12-13 ENCOUNTER — Ambulatory Visit (INDEPENDENT_AMBULATORY_CARE_PROVIDER_SITE_OTHER): Payer: Medicare Other

## 2018-12-13 ENCOUNTER — Encounter: Payer: Self-pay | Admitting: Podiatry

## 2018-12-13 ENCOUNTER — Other Ambulatory Visit: Payer: Self-pay

## 2018-12-13 DIAGNOSIS — Z9889 Other specified postprocedural states: Secondary | ICD-10-CM

## 2018-12-13 DIAGNOSIS — M2012 Hallux valgus (acquired), left foot: Secondary | ICD-10-CM

## 2018-12-14 ENCOUNTER — Encounter: Payer: Self-pay | Admitting: Podiatry

## 2018-12-15 ENCOUNTER — Other Ambulatory Visit: Payer: Medicare Other

## 2018-12-15 ENCOUNTER — Ambulatory Visit (INDEPENDENT_AMBULATORY_CARE_PROVIDER_SITE_OTHER): Payer: Medicare Other | Admitting: Medical

## 2018-12-15 ENCOUNTER — Ambulatory Visit: Payer: Self-pay | Admitting: Family Medicine

## 2018-12-15 ENCOUNTER — Other Ambulatory Visit: Payer: Self-pay

## 2018-12-15 ENCOUNTER — Encounter: Payer: Self-pay | Admitting: Medical

## 2018-12-15 VITALS — BP 127/74 | HR 68 | Ht 63.0 in | Wt 130.0 lb

## 2018-12-15 DIAGNOSIS — R42 Dizziness and giddiness: Secondary | ICD-10-CM

## 2018-12-15 MED ORDER — MECLIZINE HCL 12.5 MG PO TABS: 12.5000 mg | ORAL_TABLET | Freq: Three times a day (TID) | ORAL | 0 refills | Status: DC | PRN

## 2018-12-15 NOTE — Progress Notes (Signed)
Subjective:    Patient ID: Mary Leach, female    DOB: 1950/11/29, 68 y.o.   MRN: [id number]  HPI  Virtual Visit via Video Note  I connected with Mary Leach on 12/15/18 at  3:00 PM EDT by a video enabled telemedicine application and verified that I am speaking with the correct person using two identifiers.  Location: Patient: home Provider: home  Pt does not have bp cuff.    I discussed the limitations of evaluation and management by telemedicine and the availability of in person appointments. The patient expressed understanding and agreed to proceed.  History of Present Illness:   Pt states she started a couple of days ago feeling dizzy   July 16th she had surgery on her foot.  This Wednesday she had check up post surgery. She states ran and errand or two. Then that night when went to the bathroom she felt dizziness. Then next morning mild dizziness when she woke up.Last night she sat up and felt dizzy at night and kind lost balance and tipped over on her husband. No syncope and no gross motor or sensory function deficits.  This morning when she woke felt very dizzy when got out of be. She went to bathroom. In shower vomited one time today.No ha at time of dizziness. Vomiting occurred after brief severe dizziness episode. On recent head trauma.  Pt stated initially at time of interview she still did not feel well but not worse.  Pt states feels nausea just laying down.  Pt was sweating moderatley the other day.  No headache.Pt does not feel confused. Speaking normal. No chest pain or palpitations.  Pt feels presently if turns head to left dizziness is worse.   Pt has not had much to drink today.  Pt was drinking club soda later this afternoon and states started to feel good/better. Less dizziness. No nausea. This was hour after interview as I gave her chance to find bp cuff to check both sitting and standing. When I called her back she stated feeling better.    Observations/Objective: General-no acute distress, pleasant, oriented. Lungs- on inspection lungs appear unlabored. Neck- no tracheal deviation or jvd on inspection. Neuro- gross motor function appears intact. Finger to nose intact, symmettic smile. No hand drift. Oriented and speaking normal. Negative rhomberg. Done in presence of husand to monitor her during test. Unable to do heel to toe due to boot on foot. Supine to sitting position change mild dizzy. Lying on back and head turned to left mild dizzy.  Assessment and Plan: Patient's recent symptoms might represent combination of mild dehydration with combination of vertigo and postural hypotension.   Explained to patient seeing her in later afternoon and getting labs not option as lab is closed. Sometimes metabolic cause can be cause of dizziness. Stressed hydration with propel or gatorade zero. I will make meclizine available to use if needed. Rx advisement given.  If you get worse such as  recurrent vomiting or severe dizziness then recommend ED evaluation. Long conversation regarding this. Pt expressed understanding.  Please give me update on how you are on Monday. You may need outpatient labs. But again if severe symptoms then advise ED evaluations.  Follow up Monday by phone. Asked pt to call update.  Pt bp was ok and pulse was ok.   Mackie Pai, PA-C     Follow Up Instructions:    I discussed the assessment and treatment plan with the patient. The patient was provided an opportunity  to ask questions and all were answered. The patient agreed with the plan and demonstrated an understanding of the instructions.   The patient was advised to call back or seek an in-person evaluation if the symptoms worsen or if the condition fails to improve as anticipated.  I provided 40 + minutes of non-face-to-face time during this encounter. Long discussion with pt. Called pt twice after original interiew to follow up on repeat bp     Mackie Pai, PA-C   Review of Systems  Constitutional: Negative for chills, fatigue and fever.  Respiratory: Negative for cough, chest tightness, shortness of breath and wheezing.   Cardiovascular: Negative for chest pain and palpitations.  Gastrointestinal: Negative for abdominal pain, diarrhea, nausea and vomiting.       No current symtpoms  Musculoskeletal: Negative for back pain, myalgias and neck stiffness.  Skin: Negative for rash.  Neurological: Positive for dizziness. Negative for seizures and headaches.       See  hpi.  Hematological: Negative for adenopathy. Does not bruise/bleed easily.  Psychiatric/Behavioral: Negative for behavioral problems, confusion, dysphoric mood and suicidal ideas. The patient is not nervous/anxious.    Past Medical History:  Diagnosis Date  . Allergic rhinitis   . Anxiety   . Asthma   . Bipolar disorder ([place])   . Celiac disease   . Depression   . Elevated liver function tests 2019  . Elevated testosterone level 2019  . Elevated vitamin B12 level 2019  . History of colonic polyps   . Hypothyroidism   . Ileus following gastrointestinal surgery ([place]) 10/18/2011  . Leukopenia   . Low TSH level 2019  . Malnutrition following gastrointestinal surgery 10/18/2011  . Osteoporosis 2018  . RBBB (right bundle branch block)   . Renal stone 2020     Social History   Socioeconomic History  . Marital status: Married    Spouse name: Not on file  . Number of children: Not on file  . Years of education: Not on file  . Highest education level: Not on file  Occupational History    Employer: [employer]  . Financial resource strain: Not on file  . Food insecurity    Worry: Not on file    Inability: Not on file  . Transportation needs    Medical: Not on file    Non-medical: Not on file  Tobacco Use  . Smoking status: Never Smoker  . Smokeless tobacco: Never Used  Substance and Sexual Activity  . Alcohol use: Yes     Alcohol/week: 3.0 standard drinks    Types: 3 Standard drinks or equivalent per week    Comment: 1-3 glasses of wine a week or gluten free beer  . Drug use: No  . Sexual activity: Not Currently    Partners: Male    Birth control/protection: Post-menopausal  Lifestyle  . Physical activity    Days per week: Not on file    Minutes per session: Not on file  . Stress: Not on file  Relationships  . Social Herbalist on phone: Not on file    Gets together: Not on file    Attends religious service: Not on file    Active member of club or organization: Not on file    Attends meetings of clubs or organizations: Not on file    Relationship status: Not on file  . Intimate partner violence    Fear of current or ex partner: Not on file  Emotionally abused: Not on file    Physically abused: Not on file    Forced sexual activity: Not on file  Other Topics Concern  . Not on file  Social History Narrative   Married, lives with husband and son Jenny Reichmann.  [employer] ([employer]).  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    Past Surgical History:  Procedure Laterality Date  . COLON SURGERY    . COLONOSCOPY W/ POLYPECTOMY    . COLOSTOMY TAKEDOWN  05/29/2012   Procedure: LAPAROSCOPIC COLOSTOMY TAKEDOWN;  Surgeon: Odis Hollingshead, MD;  Location: [facility];  Service: General;  Laterality: N/A;  Laparoscopic Assisted Colostomy Closure  with proctoscopy  . CYSTECTOMY     LEFT FOOT, RIGHT HAND  . DILATATION & CURETTAGE/HYSTEROSCOPY WITH MYOSURE N/A 07/25/2018   Procedure: DILATATION & CURETTAGE/HYSTEROSCOPY WITH MYOSURE RESECTION OF ENDOMETRIAL AND ENDOCERVICAL POLYP;  Surgeon: Nunzio Cobbs, MD;  Location: [facility];  Service: Gynecology;  Laterality: N/A;  . FOOT SURGERY    . LAPAROTOMY  10/07/2011   Procedure: EXPLORATORY LAPAROTOMY;  Surgeon: Odis Hollingshead, MD;  Location: [facility];  Service: General;  Laterality: N/A;  hartmann procedure and creation of colostomy   . NASAL SEPTOPLASTY W/ TURBINOPLASTY Bilateral 11/11/2015   Procedure: SEPTOPLASTY BILATERAL TURBINATE  REDUCTION ;  Surgeon: Leta Baptist, MD;  Location: [facility];  Service: ENT;  Laterality: Bilateral;  . NASAL SINUS SURGERY Bilateral 11/11/2015   Procedure: BILATERAL ENDOSCOPIC  MAXILLARY ANTROSTOMY;  Surgeon: Leta Baptist, MD;  Location: [facility];  Service: ENT;  Laterality: Bilateral;  . TONSILLECTOMY AND ADENOIDECTOMY    . TUBAL LIGATION    . UPPER GASTROINTESTINAL ENDOSCOPY  01/19/12    Family History  Problem Relation Age of Onset  . Heart disease Father   . Osteoporosis Father   . Osteoporosis Mother   . Mental illness Paternal Aunt   . Skin cancer Maternal Grandmother   . Hypertension Maternal Grandfather   . Heart disease Paternal Grandfather   . Celiac disease Grandchild     Allergies  Allergen Reactions  . Augmentin [Amoxicillin-Pot Clavulanate] Diarrhea  . Chocolate   . Other     Multiple food intolerances. Eggs, corn, potatoes, yeast, millett, buckwheat, sesame , soy.  Also observes a dairy and wheat free diet.  Marland Kitchen Prevnar [Pneumococcal 13-Val Conj Vacc]     Vaccine contains yeast   . Sulfonamide Derivatives Rash    Arms only.  . Tape Rash    Current Outpatient Medications on File Prior to Visit  Medication Sig Dispense Refill  . ADVAIR DISKUS 100-50 MCG/DOSE AEPB Inhale 1 puff into the lungs 2 (two) times daily.     Francia Greaves THYROID 120 MG tablet Take 1 tablet by mouth daily.    . Azelastine-Fluticasone (DYMISTA) 137-50 MCG/ACT SUSP Place 2 sprays into the nose daily.     . Biotin 5000 MCG CAPS Take 1 capsule by mouth daily.     . cetirizine (ZYRTEC) 10 MG tablet Take 10 mg by mouth every evening.     . CHELATED ZINC PO Take 30 mg by mouth every evening.     . Cholecalciferol (VITAMIN D PO) Take 5,000 each by mouth daily.     Marland Kitchen EPINEPHrine (EPI-PEN) 0.3 mg/0.3 mL DEVI Inject 0.3 mg into the muscle once. For previous allergies, maybe  gluten    . FLUoxetine (PROZAC) 10 MG capsule TAKE 3 CAPSULES DAILY AS DIRECTED. 93 capsule 6  . magnesium gluconate (  MAGONATE) 500 MG tablet Take 1,500 mg by mouth every evening.     . meloxicam (MOBIC) 15 MG tablet Take 1 tablet (15 mg total) by mouth daily. 30 tablet 1  . Multiple Minerals-Vitamins (NUTRA-SUPPORT BONE) CAPS Take 1-2 tablets by mouth See admin instructions. Take 1 tablet every morning and 2 tablets every night    . Nutritional Supplements (DHEA PO) Take 5 mg by mouth daily.    . Omega-3 Fatty Acids (SALMON OIL PO) Take 1,300 mg by mouth every evening.     Marland Kitchen OVER THE COUNTER MEDICATION     . oxyCODONE-acetaminophen (PERCOCET) 5-325 MG tablet Take 1 tablet by mouth every 6 (six) hours as needed for severe pain. 30 tablet 0  . Pregnenolone POWD Take 75 mg by mouth daily.     . Probiotic Product (PROBIOTIC DAILY PO) Take by mouth. metaflora IB    . vitamin C (ASCORBIC ACID) 500 MG tablet Take 500 mg by mouth daily.    Marland Kitchen VITAMIN K PO Take 150 mcg by mouth daily.     No current facility-administered medications on file prior to visit.     Ht 5\' 3"  (1.6 m)   Wt 130 lb (59 kg)   LMP 05/24/2006   BMI 23.03 kg/m       Objective:   Physical Exam        Assessment & Plan:

## 2018-12-15 NOTE — Telephone Encounter (Signed)
  Pt called in c/o "not feeling well yesterday".   Today she vomited her breakfast and is feeling "dizzy but not really dizzy".   "I just feel weird". She had foot surgery a week ago this past Wed however everything went well.  See triage notes.    I transferred her call into Dr. Alcario Drought office to talk with Colletta Maryland because they did not have any appts available this afternoon.  Colletta Maryland is going to see what she can work out with the [facility].   Reason for Disposition . [1] MODERATE dizziness (e.g., interferes with normal activities) AND [2] has NOT been evaluated by physician for this  (Exception: dizziness caused by heat exposure, sudden standing, or poor fluid intake)  Answer Assessment - Initial Assessment Questions 1. DESCRIPTION: "Describe your dizziness."     I had foot surgery a week ago.   Last Wed was my one wk check up from surgery.    Wed. Night I felt a little dizzy when I got up to use the bathroom.   I'm in a boot for my foot. 2. LIGHTHEADED: "Do you feel lightheaded?" (e.g., somewhat faint, woozy, weak upon standing)     The next morning I got up carefully and was fine.   Last night I set up in the bed and fell against him laying in the bed.   I wasn't dizzy when this happened. Today I felt dizzy and vomited my breakfast.   I've not eaten since then.   No fever.   II felt hot right before I vomited.    I'm propped up in bed but not sitting up.   If I sit up I feel like I could faint.   3. VERTIGO: "Do you feel like either you or the room is spinning or tilting?" (i.e. vertigo)     No 4. SEVERITY: "How bad is it?"  "Do you feel like you are going to faint?" "Can you stand and walk?"   - MILD - walking normally   - MODERATE - interferes with normal activities (e.g., work, school)    - SEVERE - unable to stand, requires support to walk, feels like passing out now.      Walking normally but with a boot. 5. ONSET:  "When did the dizziness begin?"     I don't  know.   I didn't feel good yesterday. 6. AGGRAVATING FACTORS: "Does anything make it worse?" (e.g., standing, change in head position)     Changing position. 7. HEART RATE: "Can you tell me your heart rate?" "How many beats in 15 seconds?"  (Note: not all patients can do this)       Not asked 8. CAUSE: "What do you think is causing the dizziness?"     I'm not sure. 9. RECURRENT SYMPTOM: "Have you had dizziness before?" If so, ask: "When was the last time?" "What happened that time?"     Years ago  10. OTHER SYMPTOMS: "Do you have any other symptoms?" (e.g., fever, chest pain, vomiting, diarrhea, bleeding)       Vomited this morning. 11. PREGNANCY: "Is there any chance you are pregnant?" "When was your last menstrual period?"       Not asked due to age  Protocols used: DIZZINESS [facility]

## 2018-12-15 NOTE — Patient Instructions (Addendum)
Patient's recent symptoms might represent combination of mild dehydration with combination of vertigo and postural hypotension.   Explained to patient seeing her in later afternoon and getting labs not option as lab is closed. Sometimes metabolic cause can be cause of dizziness. Stressed hydration with propel or gatorade zero. I will make meclizine available to use if needed. Rx advisement given.  If you get worse such as  recurrent vomiting or severe dizziness then recommend ED evaluation. Long conversation regarding this. Pt expressed understanding.  Please give me update on how you are on Monday. You may need outpatient labs. But again if severe symptoms then advise ED evaluations.  Follow up Monday by phone. Asked pt to call update.  Pt bp was ok and pulse was ok.

## 2018-12-18 ENCOUNTER — Telehealth: Payer: Self-pay

## 2018-12-18 ENCOUNTER — Ambulatory Visit: Payer: Medicare Other

## 2018-12-18 NOTE — Telephone Encounter (Signed)
Please advise 

## 2018-12-18 NOTE — Telephone Encounter (Signed)
Lorazepam 0.5 mg po q 8 hours prn vertigo, #10 -  BUT triage to see if this sounds like true vertigo.

## 2018-12-18 NOTE — Telephone Encounter (Signed)
Copied from [place] [phone]. Topic: General - Other >> Dec 18, 2018 12:18 PM Leward Quan A wrote: Reason for CRM: Patient called to say that she had an attack of vertigo on Friday 12/15/2018 and is wondering if there is something also she can take along with the meclizine (ANTIVERT) 12.5 MG tablet  to help her symptoms. She would like a call back at Ph# [phone]

## 2018-12-19 ENCOUNTER — Other Ambulatory Visit: Payer: Self-pay

## 2018-12-19 MED ORDER — ONDANSETRON 4 MG PO TBDP: 4.0000 mg | ORAL_TABLET | Freq: Three times a day (TID) | ORAL | 0 refills | Status: DC | PRN

## 2018-12-19 NOTE — Telephone Encounter (Signed)
Called in and let patient know. Will call if any issues.

## 2018-12-19 NOTE — Telephone Encounter (Signed)
Got it. Okay to call in Zofran.

## 2018-12-19 NOTE — Telephone Encounter (Signed)
Patient does have some vertigo by she is wanting something more for nausea and vomiting  that she has been having. Ok to call in Zofran let patient know I will call back soon.

## 2018-12-21 NOTE — Progress Notes (Signed)
   Subjective:  Patient presents today status post Akin and 2nd metatarsal osteotomies left. DOS: 12/07/2018. She states she is doing well. She reports minimal soreness and denies modifying factors. She has been using the CAM boot as directed. Patient is here for further evaluation and treatment.    Past Medical History:  Diagnosis Date  . Allergic rhinitis   . Anxiety   . Asthma   . Bipolar disorder ([place])   . Celiac disease   . Depression   . Elevated liver function tests 2019  . Elevated testosterone level 2019  . Elevated vitamin B12 level 2019  . History of colonic polyps   . Hypothyroidism   . Ileus following gastrointestinal surgery ([place]) 10/18/2011  . Leukopenia   . Low TSH level 2019  . Malnutrition following gastrointestinal surgery 10/18/2011  . Osteoporosis 2018  . RBBB (right bundle branch block)   . Renal stone 2020      Objective/Physical Exam Neurovascular status intact.  Skin incisions appear to be well coapted with sutures and staples intact. No sign of infectious process noted. No dehiscence. No active bleeding noted. Moderate edema noted to the surgical extremity.  Radiographic Exam:  Orthopedic hardware and osteotomies sites appear to be stable with routine healing.  Assessment: 1. s/p Akin and 2nd metatarsal osteotomies left. DOS: 12/07/2018   Plan of Care:  1. Patient was evaluated. X-rays reviewed 2. Dressing changed. Keep clean, dry and intact for one week.  3. Continue weightbearing in CAM boot.  4. Return to clinic in one week for suture removal.    Edrick Kins, DPM [facility]  Dr. Edrick Kins, [address]                                        [place], [place] [address]                Office [phone]  Fax [phone]

## 2018-12-25 ENCOUNTER — Encounter: Payer: Self-pay | Admitting: Podiatry

## 2018-12-25 ENCOUNTER — Ambulatory Visit (INDEPENDENT_AMBULATORY_CARE_PROVIDER_SITE_OTHER): Payer: Medicare Other | Admitting: Podiatry

## 2018-12-25 ENCOUNTER — Other Ambulatory Visit: Payer: Self-pay

## 2018-12-25 DIAGNOSIS — M7742 Metatarsalgia, left foot: Secondary | ICD-10-CM

## 2018-12-25 DIAGNOSIS — Z9889 Other specified postprocedural states: Secondary | ICD-10-CM

## 2018-12-25 DIAGNOSIS — M2012 Hallux valgus (acquired), left foot: Secondary | ICD-10-CM

## 2018-12-26 ENCOUNTER — Encounter: Payer: Self-pay | Admitting: Family Medicine

## 2018-12-26 ENCOUNTER — Ambulatory Visit (INDEPENDENT_AMBULATORY_CARE_PROVIDER_SITE_OTHER): Payer: Medicare Other | Admitting: Family Medicine

## 2018-12-26 ENCOUNTER — Other Ambulatory Visit: Payer: Self-pay

## 2018-12-26 VITALS — BP 120/62 | HR 79 | Temp 97.5°F | Ht 63.0 in | Wt 134.0 lb

## 2018-12-26 DIAGNOSIS — R42 Dizziness and giddiness: Secondary | ICD-10-CM | POA: Diagnosis not present

## 2018-12-26 NOTE — Progress Notes (Signed)
Mary Leach is a 68 y.o. female is here for follow up.  History of Present Illness:   HPI: Patient had episode of vertigo last week.  She was seen virtually for this issue.  She did take meclizine daily for the week.  Followed up with orthopedics yesterday and was told to stop the meclizine.  Feels much better today.  No nausea or dizziness.  She has been staying hydrated.  Foot is healing well.  No other concerns today.  Health Maintenance Due  Topic Date Due  . INFLUENZA VACCINE  12/23/2018   Depression screen [facility] 2/9 07/18/2018 05/09/2018 06/13/2017  Decreased Interest 0 0 0  Down, Depressed, Hopeless 0 0 0  PHQ - 2 Score 0 0 0  Altered sleeping 0 0 0  Tired, decreased energy 0 0 0  Change in appetite 0 0 0  Feeling bad or failure about yourself  0 0 0  Trouble concentrating 0 0 2  Moving slowly or fidgety/restless 0 0 0  Suicidal thoughts 0 - 0  PHQ-9 Score 0 0 2  Difficult doing work/chores Not difficult at all Not difficult at all -   PMHx, SurgHx, SocialHx, FamHx, Medications, and Allergies were reviewed in the Visit Navigator and updated as appropriate.   Patient Active Problem List   Diagnosis Date Noted  . Adult acne 07/20/2018  . Postmenopausal bleeding 05/15/2018  . High risk medication use 04/03/2018  . Chronic maxillary sinusitis 04/03/2018  . Elevated testosterone level in female 04/03/2018  . Allergic conjunctivitis and rhinitis, bilateral 04/03/2018  . Celiac sprue 07/02/2015  . Allergic rhinitis due to pollen 12/18/2014  . H/O resection of large bowel 12/15/2013  . 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 Asthma 10/18/2011  . 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 Smoking status: Never Smoker  . Smokeless tobacco: Never Used  Substance Use Topics  . Alcohol use: Yes    Alcohol/week: 3.0 standard drinks    Types: 3 Standard drinks or equivalent per  week    Comment: 1-3 glasses of wine a week or gluten free beer  . Drug use: No   Current Medications and Allergies   .  ADVAIR DISKUS 100-50 MCG/DOSE AEPB, Inhale 1 puff into the lungs 2 (two) times daily. , Disp: , Rfl:  .  ARMOUR THYROID 120 MG tablet, Take 1 tablet by mouth daily., Disp: , Rfl:  .  Azelastine-Fluticasone (DYMISTA) 137-50 MCG/ACT SUSP, Place 2 sprays into the nose daily. , Disp: , Rfl:  .  cetirizine (ZYRTEC) 10 MG tablet, Take 10 mg by mouth every evening. , Disp: , Rfl:  .  CHELATED ZINC PO, Take 30 mg by mouth every evening. , Disp: , Rfl:  .  Cholecalciferol (VITAMIN D PO), Take 5,000 each by mouth daily. , Disp: , Rfl:  .  EPINEPHrine (EPI-PEN) 0.3 mg/0.3 mL DEVI, Inject 0.3 mg into the muscle once. For previous allergies, maybe gluten, Disp: , Rfl:  .  FLUoxetine (PROZAC) 10 MG capsule, TAKE 3 CAPSULES DAILY AS DIRECTED., Disp: 93 capsule, Rfl: 6 .  magnesium gluconate (MAGONATE) 500 MG tablet, Take 1,500 mg by mouth every evening. , Disp: , Rfl:  .  Multiple Minerals-Vitamins (NUTRA-SUPPORT BONE) CAPS, Take 1-2 tablets by mouth See admin instructions. Take 1 tablet every morning and 2 tablets every night, Disp: , Rfl:  .  ondansetron (ZOFRAN ODT) 4 MG disintegrating  tablet, Take 1 tablet (4 mg total) by mouth every 8 (eight) hours as needed for nausea or vomiting., Disp: 20 tablet, Rfl: 0 .  OVER THE COUNTER MEDICATION, , Disp: , Rfl:  .  Probiotic Product (PROBIOTIC DAILY PO), Take by mouth. metaflora IB, Disp: , Rfl:  .  vitamin C (ASCORBIC ACID) 500 MG tablet, Take 500 mg by mouth daily., Disp: , Rfl:  .  VITAMIN K PO, Take 150 mcg by mouth daily., Disp: , Rfl:    Allergies  Allergen Reactions  . Augmentin [Amoxicillin-Pot Clavulanate] Diarrhea  . Chocolate   . Other     Multiple food intolerances. Eggs, corn, potatoes, yeast, millett, buckwheat, sesame , soy.  Also observes a dairy and wheat free diet.  Marland Kitchen Prevnar [Pneumococcal 13-Val Conj Vacc]     Vaccine  contains yeast   . Sulfonamide Derivatives Rash    Arms only.  . Tape Rash   Review of Systems   Pertinent items are noted in the HPI. Otherwise, a complete ROS is negative.  Vitals   Vitals:   12/26/18 1310  BP: 120/62  Pulse: 79  Temp: (!) 97.5 F (36.4 C)  TempSrc: Temporal  SpO2: 97%  Weight: 134 lb (60.8 kg)  Height: 5' 3"  (1.6 m)     Body mass index is 23.74 kg/m.  Physical Exam   Physical Exam Vitals signs and nursing note reviewed.  HENT:     Head: Normocephalic and atraumatic.  Eyes:     Pupils: Pupils are equal, round, and reactive to light.  Neck:     Musculoskeletal: Normal range of motion and neck supple.  Cardiovascular:     Rate and Rhythm: Normal rate and regular rhythm.     Heart sounds: Normal heart sounds.  Pulmonary:     Effort: Pulmonary effort is normal.  Abdominal:     Palpations: Abdomen is soft.  Skin:    General: Skin is warm.  Psychiatric:        Behavior: Behavior normal.    Assessment and Plan   Mary Leach was seen today for follow-up.  Diagnoses and all orders for this visit:  Dizziness Comments: Resolved.  It does sound like vertigo.  History of the same.  Reminded patient that she is on high-risk medication-Armour Thyroid.  Red flags reviewed.  . Orders and follow up as documented in [place], reviewed diet, exercise and weight control, cardiovascular risk and specific lipid/LDL goals reviewed, reviewed medications and side effects in detail.  . Reviewed expectations re: course of current medical issues. . Outlined signs and symptoms indicating need for more acute intervention. . Patient verbalized understanding and all questions were answered. . Patient received an After Visit Summary.  Briscoe Deutscher, DO [place], [place] 12/26/2018

## 2018-12-26 NOTE — Patient Instructions (Signed)
Vertigo Vertigo is the feeling that you or your surroundings are moving when they are not. This feeling can come and go at any time. Vertigo often goes away on its own. Vertigo can be dangerous if it occurs while you are doing something that could endanger you or others, such as driving or operating machinery. Your health care provider will do tests to determine the cause of your vertigo. Tests will also help your health care provider decide how best to treat your condition. Follow these instructions at home: Eating and drinking      Drink enough fluid to keep your urine pale yellow.  Do not drink alcohol. Activity  Return to your normal activities as told by your health care provider. Ask your health care provider what activities are safe for you.  In the morning, first sit up on the side of the bed. When you feel okay, stand slowly while you hold onto something until you know that your balance is fine.  Move slowly. Avoid sudden body or head movements or certain positions, as told by your health care provider.  If you have trouble walking or keeping your balance, try using a cane for stability. If you feel dizzy or unstable, sit down right away.  Avoid doing any tasks that would cause danger to you or others if vertigo occurs.  Avoid bending down if you feel dizzy. Place items in your home so that they are easy for you to reach without leaning over.  Do not drive or use heavy machinery if you feel dizzy. General instructions  Take over-the-counter and prescription medicines only as told by your health care provider.  Keep all follow-up visits as told by your health care provider. This is important. Contact a health care provider if:  Your medicines do not relieve your vertigo or they make it worse.  You have a fever.  Your condition gets worse or you develop new symptoms.  Your family or friends notice any behavioral changes.  Your nausea or vomiting gets worse.  You  have numbness or a prickling and tingling sensation in part of your body. Get help right away if you:  Have difficulty moving or speaking.  Are always dizzy.  Faint.  Develop severe headaches.  Have weakness in your hands, arms, or legs.  Have changes in your hearing or vision.  Develop a stiff neck.  Develop sensitivity to light. Summary  Vertigo is the feeling that you or your surroundings are moving when they are not.  Your health care provider will do tests to determine the cause of your vertigo.  Follow instructions for home care. You may be told to avoid certain tasks, positions, or movements.  Contact a health care provider if your medicines do not relieve your symptoms, or if you have a fever, nausea, vomiting, or changes in behavior.  Get help right away if you have severe headaches or difficulty speaking, or you develop hearing or vision problems. This information is not intended to replace advice given to you by your health care provider. Make sure you discuss any questions you have with your health care provider. Document Released: 02/17/2005 Document Revised: 04/03/2018 Document Reviewed: 04/03/2018 Elsevier Patient Education  2020 [employer].

## 2018-12-27 NOTE — Progress Notes (Signed)
   Subjective:  Patient presents today status post Akin and 2nd metatarsal osteotomies left. DOS: 12/07/2018. She reports significant intermittent pain. She denies any modifying factors. She has been using the CAM boot as directed. Patient is here for further evaluation and treatment.    Past Medical History:  Diagnosis Date  . Allergic rhinitis   . Anxiety   . Asthma   . Bipolar disorder ([place])   . Celiac disease   . Depression   . Elevated liver function tests 2019  . Elevated testosterone level 2019  . Elevated vitamin B12 level 2019  . History of colonic polyps   . Hypothyroidism   . Ileus following gastrointestinal surgery ([place]) 10/18/2011  . Leukopenia   . Low TSH level 2019  . Malnutrition following gastrointestinal surgery 10/18/2011  . Osteoporosis 2018  . RBBB (right bundle branch block)   . Renal stone 2020      Objective/Physical Exam Neurovascular status intact.  Skin incisions appear to be well coapted with sutures and staples intact. No sign of infectious process noted. No dehiscence. No active bleeding noted. Moderate edema noted to the surgical extremity.  Assessment: 1. s/p Akin and 2nd metatarsal osteotomies left. DOS: 12/07/2018   Plan of Care:  1. Patient was evaluated. 2. Sutures removed.  3. Post op shoe dispensed. Discontinue using CAM boot.  4. Return to clinic in 2 weeks.    Edrick Kins, DPM [facility]  Dr. Edrick Kins, [address]                                        [place], [place] [address]                Office [phone]  Fax [phone]

## 2019-01-08 ENCOUNTER — Other Ambulatory Visit: Payer: Self-pay

## 2019-01-08 ENCOUNTER — Encounter: Payer: Self-pay | Admitting: Podiatry

## 2019-01-08 ENCOUNTER — Ambulatory Visit (INDEPENDENT_AMBULATORY_CARE_PROVIDER_SITE_OTHER): Payer: Medicare Other

## 2019-01-08 ENCOUNTER — Ambulatory Visit (INDEPENDENT_AMBULATORY_CARE_PROVIDER_SITE_OTHER): Payer: Medicare Other | Admitting: Podiatry

## 2019-01-08 VITALS — Temp 98.2°F

## 2019-01-08 DIAGNOSIS — Z9889 Other specified postprocedural states: Secondary | ICD-10-CM

## 2019-01-08 DIAGNOSIS — M2012 Hallux valgus (acquired), left foot: Secondary | ICD-10-CM | POA: Diagnosis not present

## 2019-01-10 ENCOUNTER — Telehealth: Payer: Self-pay | Admitting: *Deleted

## 2019-01-10 NOTE — Progress Notes (Signed)
   Subjective:  Patient presents today status post Akin and 2nd metatarsal osteotomies left. DOS: 12/07/2018. She states she is doing well. She reports some continued swelling with associated erythema noted to the dorsal foot. She has been using the post op shoe as directed. Patient is here for further evaluation and treatment.    Past Medical History:  Diagnosis Date  . Allergic rhinitis   . Anxiety   . Asthma   . Bipolar disorder ([place])   . Celiac disease   . Depression   . Elevated liver function tests 2019  . Elevated testosterone level 2019  . Elevated vitamin B12 level 2019  . History of colonic polyps   . Hypothyroidism   . Ileus following gastrointestinal surgery ([place]) 10/18/2011  . Leukopenia   . Low TSH level 2019  . Malnutrition following gastrointestinal surgery 10/18/2011  . Osteoporosis 2018  . RBBB (right bundle branch block)   . Renal stone 2020      Objective/Physical Exam Neurovascular status intact.  Skin incisions appear to be well coapted. No sign of infectious process noted. No dehiscence. No active bleeding noted. Moderate edema noted to the surgical extremity.  Radiographic Exam:  Orthopedic hardware and osteotomies sites appear to be stable with routine healing.  Assessment: 1. s/p Akin and 2nd metatarsal osteotomies left. DOS: 12/07/2018   Plan of Care:  1. Patient was evaluated. X-Rays reviewed.  2. Continue using post op shoe.  3. Recommended daily ROM exercises to 2nd MPJ of the left foot.  4. Return to clinic in 4 weeks for follow up X-Ray.     Edrick Kins, DPM [facility]  Dr. Edrick Kins, [address]                                        [place], [place] [address]                Office [phone]  Fax [phone]

## 2019-01-10 NOTE — Telephone Encounter (Signed)
Pt states she had surgery about 1 month ago and had wanted to know if she could perform epsom salt soak to reduce swelling.

## 2019-01-11 NOTE — Progress Notes (Signed)
68 y.o. G2P2 Married Caucasian female here for annual exam.    Off of HRT completely. No vaginal bleeding.   Had left foot surgery.  PCP:   Briscoe Deutscher, DO  Patient's last menstrual period was 05/24/2006.           Sexually active: No. husband -- ED The current method of family planning is tubal ligation.    Exercising: Yes.    modified aerobics & stretches Smoker:  no  Health Maintenance: Pap: 01-12-18  Neg, 12-26-14 Neg:Neg HR HPV History of abnormal Pap:  no MMG: 09-27-17 Neg/density b/BiRads1--appt.02-13-19 Colonoscopy: 12/08/15 - 1 polyp;next due 2022  BMD:   03-15-17  Result :Osteoporosis TDaP:  06-26-09 Gardasil:   n/a HIV:04-06-16 NR Hep C: 04-06-16 Neg Screening Labs:  PCP.    reports that she has never smoked. She has never used smokeless tobacco. She reports current alcohol use of about 4.0 - 5.0 standard drinks of alcohol per week. She reports that she does not use drugs.  Past Medical History:  Diagnosis Date  . Allergic rhinitis   . Anxiety   . Asthma   . Bipolar disorder ([place])   . Celiac disease   . Depression   . Elevated liver function tests 2019  . Elevated testosterone level 2019  . Elevated vitamin B12 level 2019  . History of colonic polyps   . Hypothyroidism   . Ileus following gastrointestinal surgery ([place]) 10/18/2011  . Leukopenia   . Low TSH level 2019  . Malnutrition following gastrointestinal surgery 10/18/2011  . Osteoporosis 2018  . RBBB (right bundle branch block)   . Renal stone 2020    Past Surgical History:  Procedure Laterality Date  . COLON SURGERY    . COLONOSCOPY W/ POLYPECTOMY    . COLOSTOMY TAKEDOWN  05/29/2012   Procedure: LAPAROSCOPIC COLOSTOMY TAKEDOWN;  Surgeon: Odis Hollingshead, MD;  Location: [facility];  Service: General;  Laterality: N/A;  Laparoscopic Assisted Colostomy Closure  with proctoscopy  . CYSTECTOMY     LEFT FOOT, RIGHT HAND  . DILATATION & CURETTAGE/HYSTEROSCOPY WITH MYOSURE N/A 07/25/2018   Procedure: DILATATION  & CURETTAGE/HYSTEROSCOPY WITH MYOSURE RESECTION OF ENDOMETRIAL AND ENDOCERVICAL POLYP;  Surgeon: Nunzio Cobbs, MD;  Location[facility];  Service: Gynecology;  Laterality: N/A;  . FOOT SURGERY    . FOOT SURGERY Left 12/07/2018   cyst removed  . LAPAROTOMY  10/07/2011   Procedure: EXPLORATORY LAPAROTOMY;  Surgeon: Odis Hollingshead, MD;  Location: [facility];  Service: General;  Laterality: N/A;  hartmann procedure and creation of colostomy  . NASAL SEPTOPLASTY W/ TURBINOPLASTY Bilateral 11/11/2015   Procedure: SEPTOPLASTY BILATERAL TURBINATE  REDUCTION ;  Surgeon: Leta Baptist, MD;  Location: [facility];  Service: ENT;  Laterality: Bilateral;  . NASAL SINUS SURGERY Bilateral 11/11/2015   Procedure: BILATERAL ENDOSCOPIC  MAXILLARY ANTROSTOMY;  Surgeon: Leta Baptist, MD;  Location: [place];  Service: ENT;  Laterality: Bilateral;  . TONSILLECTOMY AND ADENOIDECTOMY    . TUBAL LIGATION    . UPPER GASTROINTESTINAL ENDOSCOPY  01/19/12    Current Outpatient Medications  Medication Sig Dispense Refill  . ADVAIR DISKUS 100-50 MCG/DOSE AEPB Inhale 1 puff into the lungs 2 (two) times daily.     Francia Greaves THYROID 120 MG tablet Take 1 tablet by mouth daily.    . Azelastine-Fluticasone (DYMISTA) 137-50 MCG/ACT SUSP Place 2 sprays into the nose daily.     . cetirizine (ZYRTEC) 10 MG tablet Take  10 mg by mouth every evening.     Marland Kitchen EPINEPHrine (EPI-PEN) 0.3 mg/0.3 mL DEVI Inject 0.3 mg into the muscle once. For previous allergies, maybe gluten    . FLUoxetine (PROZAC) 10 MG capsule Take 10 mg by mouth daily. Takes 3 tablets in AM    . magnesium gluconate (MAGONATE) 500 MG tablet Take 1,500 mg by mouth every evening.     . Multiple Minerals-Vitamins (NUTRA-SUPPORT BONE) CAPS Take 1-2 tablets by mouth See admin instructions. Take 1 tablet every morning and 2 tablets every night    . Probiotic Product (PROBIOTIC DAILY PO) Take by mouth. metaflora IB    . vitamin C  (ASCORBIC ACID) 500 MG tablet Take 500 mg by mouth daily.     No current facility-administered medications for this visit.     Family History  Problem Relation Age of Onset  . Heart disease Father   . Osteoporosis Father   . Osteoporosis Mother   . Mental illness Paternal Aunt   . Skin cancer Maternal Grandmother   . Hypertension Maternal Grandfather   . Heart disease Paternal Grandfather   . Celiac disease Grandchild     Review of Systems  All other systems reviewed and are negative.   Exam:   BP 110/72 (BP Location: Right Arm)   Pulse 70   Temp (!) 97.3 F (36.3 C) (Temporal)   Resp 14   Ht 5' 4"  (1.626 m)   Wt 135 lb (61.2 kg)   LMP 05/24/2006   BMI 23.17 kg/m     General appearance: alert, cooperative and appears stated age Head: normocephalic, without obvious abnormality, atraumatic Neck: no adenopathy, supple, symmetrical, trachea midline and thyroid normal to inspection and palpation Lungs: clear to auscultation bilaterally Breasts: normal appearance, no masses or tenderness, No nipple retraction or dimpling, No nipple discharge or bleeding, No axillary adenopathy Heart: regular rate and rhythm Abdomen: soft, non-tender; no masses, no organomegaly Extremities: Wrap on left foot.   Skin: skin color, texture, turgor normal. No rashes or lesions Lymph nodes: cervical, supraclavicular, and axillary nodes normal. Neurologic: grossly normal  Pelvic: External genitalia:  no lesions              No abnormal inguinal nodes palpated.              Urethra:  normal appearing urethra with no masses, tenderness or lesions              Bartholins and Skenes: normal                 Vagina: normal appearing vagina with normal color and discharge, no lesions              Cervix: no lesions              Pap taken: No. Bimanual Exam:  Uterus:  normal size, contour, position, consistency, mobility, non-tender              Adnexa: no mass, fullness, tenderness               Rectal exam: Yes.  .  Confirms.              Anus:  normal sphincter tone, no lesions  Chaperone was present for exam.  Assessment:   Well woman visit with normal exam. Celiac Asthma.  Osteoporosis of forearm, osteopenia otherwise.  Not on treatment.   Plan: Mammogram screening discussed. Self breast awareness reviewed. Pap and HR HPV as above.  Guidelines for Calcium, Vitamin D, regular exercise program including cardiovascular and weight bearing exercise. BMD testing this year.  I recommended a flu vaccine.  Labs with PCP. Follow up annually and prn.   After visit summary provided.

## 2019-01-12 ENCOUNTER — Other Ambulatory Visit: Payer: Self-pay

## 2019-01-15 ENCOUNTER — Encounter: Payer: Self-pay | Admitting: Obstetrics and Gynecology

## 2019-01-15 ENCOUNTER — Ambulatory Visit (INDEPENDENT_AMBULATORY_CARE_PROVIDER_SITE_OTHER): Payer: Medicare Other | Admitting: Obstetrics and Gynecology

## 2019-01-15 ENCOUNTER — Other Ambulatory Visit: Payer: Self-pay

## 2019-01-15 VITALS — BP 110/72 | HR 70 | Temp 97.3°F | Resp 14 | Ht 64.0 in | Wt 135.0 lb

## 2019-01-15 DIAGNOSIS — Z124 Encounter for screening for malignant neoplasm of cervix: Secondary | ICD-10-CM | POA: Diagnosis not present

## 2019-01-15 DIAGNOSIS — Z01419 Encounter for gynecological examination (general) (routine) without abnormal findings: Secondary | ICD-10-CM

## 2019-01-15 DIAGNOSIS — M810 Age-related osteoporosis without current pathological fracture: Secondary | ICD-10-CM

## 2019-01-15 NOTE — Patient Instructions (Signed)

## 2019-01-16 ENCOUNTER — Ambulatory Visit: Payer: Medicare Other | Admitting: Family Medicine

## 2019-01-16 ENCOUNTER — Other Ambulatory Visit: Payer: Self-pay

## 2019-01-16 MED ORDER — FLUOXETINE HCL 10 MG PO CAPS: 30.0000 mg | ORAL_CAPSULE | Freq: Every day | ORAL | 0 refills | Status: DC

## 2019-01-25 NOTE — Progress Notes (Deleted)
Mary Leach is a 68 y.o. female is here for follow up.  History of Present Illness:   (SCRIBE ATTESTATION)  HPI:   Health Maintenance Due  Topic Date Due  . INFLUENZA VACCINE  12/23/2018   Depression screen [facility] 2/9 07/18/2018 05/09/2018 06/13/2017  Decreased Interest 0 0 0  Down, Depressed, Hopeless 0 0 0  PHQ - 2 Score 0 0 0  Altered sleeping 0 0 0  Tired, decreased energy 0 0 0  Change in appetite 0 0 0  Feeling bad or failure about yourself  0 0 0  Trouble concentrating 0 0 2  Moving slowly or fidgety/restless 0 0 0  Suicidal thoughts 0 - 0  PHQ-9 Score 0 0 2  Difficult doing work/chores Not difficult at all Not difficult at all -   PMHx, SurgHx, SocialHx, FamHx, Medications, and Allergies were reviewed in the Visit Navigator and updated as appropriate.   Patient Active Problem List   Diagnosis Date Noted  . Adult acne 07/20/2018  . Postmenopausal bleeding 05/15/2018  . High risk medication use 04/03/2018  . Chronic maxillary sinusitis 04/03/2018  . Elevated testosterone level in female 04/03/2018  . Allergic conjunctivitis and rhinitis, bilateral 04/03/2018  . Celiac sprue 07/02/2015  . Allergic rhinitis due to pollen 12/18/2014  . H/O resection of large bowel 12/15/2013  . 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 Asthma 10/18/2011  . 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 Smoking status: Never Smoker  . Smokeless tobacco: Never Used  Substance Use Topics  . Alcohol use: Yes    Alcohol/week: 4.0 - 5.0 standard drinks    Types: 4 - 5 Standard drinks or equivalent per week  . Drug use: No   Current Medications and Allergies   Current Outpatient Medications:  .  ADVAIR DISKUS 100-50 MCG/DOSE AEPB, Inhale 1 puff into the lungs 2 (two) times daily. , Disp: , Rfl:  .  ARMOUR THYROID 120 MG tablet, Take 1 tablet by mouth daily., Disp: , Rfl:  .   Azelastine-Fluticasone (DYMISTA) 137-50 MCG/ACT SUSP, Place 2 sprays into the nose daily. , Disp: , Rfl:  .  cetirizine (ZYRTEC) 10 MG tablet, Take 10 mg by mouth every evening. , Disp: , Rfl:  .  EPINEPHrine (EPI-PEN) 0.3 mg/0.3 mL DEVI, Inject 0.3 mg into the muscle once. For previous allergies, maybe gluten, Disp: , Rfl:  .  FLUoxetine (PROZAC) 10 MG capsule, Take 3 capsules (30 mg total) by mouth daily. Takes 3 tablets in AM, Disp: 270 capsule, Rfl: 0 .  magnesium gluconate (MAGONATE) 500 MG tablet, Take 1,500 mg by mouth every evening. , Disp: , Rfl:  .  Multiple Minerals-Vitamins (NUTRA-SUPPORT BONE) CAPS, Take 1-2 tablets by mouth See admin instructions. Take 1 tablet every morning and 2 tablets every night, Disp: , Rfl:  .  Probiotic Product (PROBIOTIC DAILY PO), Take by mouth. metaflora IB, Disp: , Rfl:  .  vitamin C (ASCORBIC ACID) 500 MG tablet, Take 500 mg by mouth daily., Disp: , Rfl:   Allergies  Allergen Reactions  . Augmentin [Amoxicillin-Pot Clavulanate] Diarrhea  . Chocolate   . Other     **Celiac Disease* Multiple food intolerances. Eggs, corn, potatoes, yeast, millett, buckwheat, sesame , soy.  Also observes a dairy and wheat free diet.  Marland Kitchen Prevnar [Pneumococcal 13-Val Conj Vacc]     Vaccine contains yeast   . Sulfonamide Derivatives  Rash    Arms only.  . Tape Rash   Review of Systems   Pertinent items are noted in the HPI. Otherwise, a complete ROS is negative.  Vitals  There were no vitals filed for this visit.   There is no height or weight on file to calculate BMI.  Physical Exam   Physical Exam  Results for orders placed or performed in visit on 09/12/18  Urine Culture   Specimen: Urine, Clean Catch   UC  Result Value Ref Range   Urine Culture, Routine Final report    Organism ID, Bacteria Comment   POCT Urinalysis Dipstick  Result Value Ref Range   Color, UA yellow    Clarity, UA cloudy    Glucose, UA Negative Negative   Bilirubin, UA n     Ketones, UA n    Spec Grav, UA     Blood, UA large    pH, UA 8.0 5.0 - 8.0   Protein, UA Negative Negative   Urobilinogen, UA 0.2 0.2 or 1.0 E.U./dL   Nitrite, UA n    Leukocytes, UA Trace (A) Negative   Appearance     Odor      Assessment and Plan   There are no diagnoses linked to this encounter.  . Orders and follow up as documented in [place], reviewed diet, exercise and weight control, cardiovascular risk and specific lipid/LDL goals reviewed, reviewed medications and side effects in detail.  . Reviewed expectations re: course of current medical issues. . Outlined signs and symptoms indicating need for more acute intervention. . Patient verbalized understanding and all questions were answered. . Patient received an After Visit Summary.  *** CMA served as Education administrator during this visit. History, Physical, and Plan performed by medical provider. The above documentation has been reviewed and is accurate and complete. Briscoe Deutscher, D.O.  Briscoe Deutscher, DO [place] [place] [facility] 01/25/2019

## 2019-01-26 ENCOUNTER — Encounter: Payer: Medicare Other | Admitting: Family Medicine

## 2019-02-05 ENCOUNTER — Other Ambulatory Visit: Payer: Self-pay | Admitting: Podiatry

## 2019-02-05 ENCOUNTER — Encounter: Payer: Self-pay | Admitting: Podiatry

## 2019-02-05 ENCOUNTER — Ambulatory Visit (INDEPENDENT_AMBULATORY_CARE_PROVIDER_SITE_OTHER): Payer: Self-pay | Admitting: Podiatry

## 2019-02-05 ENCOUNTER — Ambulatory Visit (INDEPENDENT_AMBULATORY_CARE_PROVIDER_SITE_OTHER): Payer: Medicare Other

## 2019-02-05 ENCOUNTER — Other Ambulatory Visit: Payer: Self-pay

## 2019-02-05 DIAGNOSIS — Z9889 Other specified postprocedural states: Secondary | ICD-10-CM

## 2019-02-05 DIAGNOSIS — M2012 Hallux valgus (acquired), left foot: Secondary | ICD-10-CM

## 2019-02-06 NOTE — Progress Notes (Signed)
Subjective:    Kaisha Wachob is a 68 y.o. female and is here for a comprehensive physical exam.  There are no preventive care reminders to display for this patient.  Current Outpatient Medications:  .  ADVAIR DISKUS 100-50 MCG/DOSE AEPB, Inhale 1 puff into the lungs 2 (two) times daily. , Disp: , Rfl:  .  ARMOUR THYROID 120 MG tablet, Take 1 tablet by mouth daily., Disp: , Rfl:  .  Azelastine-Fluticasone (DYMISTA) 137-50 MCG/ACT SUSP, Place 2 sprays into the nose daily. , Disp: , Rfl:  .  cetirizine (ZYRTEC) 10 MG tablet, Take 10 mg by mouth every evening. , Disp: , Rfl:  .  EPINEPHrine (EPI-PEN) 0.3 mg/0.3 mL DEVI, Inject 0.3 mg into the muscle once. For previous allergies, maybe gluten, Disp: , Rfl:  .  FLUoxetine (PROZAC) 10 MG capsule, Take 3 capsules (30 mg total) by mouth daily. Takes 3 tablets in AM, Disp: 270 capsule, Rfl: 3 .  magnesium gluconate (MAGONATE) 500 MG tablet, Take 1,500 mg by mouth every evening. , Disp: , Rfl:  .  Multiple Minerals-Vitamins (NUTRA-SUPPORT BONE) CAPS, Take 1-2 tablets by mouth See admin instructions. Take 1 tablet every morning and 2 tablets every night, Disp: , Rfl:  .  Probiotic Product (PROBIOTIC DAILY PO), Take by mouth. metaflora IB, Disp: , Rfl:  .  vitamin C (ASCORBIC ACID) 500 MG tablet, Take 500 mg by mouth daily., Disp: , Rfl:   PMHx, SurgHx, SocialHx, Medications, and Allergies were reviewed in the Visit Navigator and updated as appropriate.   Past Medical History:  Diagnosis Date  . Allergic rhinitis   . Anxiety   . Asthma   . Bipolar disorder ([place])   . Celiac disease   . Depression   . Elevated liver function tests 2019  . Elevated testosterone level 2019  . Elevated vitamin B12 level 2019  . History of colonic polyps   . Hypothyroidism   . Ileus following gastrointestinal surgery ([place]) 10/18/2011  . Leukopenia   . Low TSH level 2019  . Malnutrition following gastrointestinal surgery 10/18/2011  . Osteoporosis 2018  . RBBB  (right bundle branch block)   . Renal stone 2020     Past Surgical History:  Procedure Laterality Date  . COLON SURGERY    . COLONOSCOPY W/ POLYPECTOMY    . COLOSTOMY TAKEDOWN  05/29/2012   Procedure: LAPAROSCOPIC COLOSTOMY TAKEDOWN;  Surgeon: Odis Hollingshead, MD;  Location: [facility];  Service: General;  Laterality: N/A;  Laparoscopic Assisted Colostomy Closure  with proctoscopy  . CYSTECTOMY     LEFT FOOT, RIGHT HAND  . DILATATION & CURETTAGE/HYSTEROSCOPY WITH MYOSURE N/A 07/25/2018   Procedure: DILATATION & CURETTAGE/HYSTEROSCOPY WITH MYOSURE RESECTION OF ENDOMETRIAL AND ENDOCERVICAL POLYP;  Surgeon: Nunzio Cobbs, MD;  Location: [facility];  Service: Gynecology;  Laterality: N/A;  . FOOT SURGERY    . FOOT SURGERY Left 12/07/2018   cyst removed  . LAPAROTOMY  10/07/2011   Procedure: EXPLORATORY LAPAROTOMY;  Surgeon: Odis Hollingshead, MD;  Location: [facility];  Service: General;  Laterality: N/A;  hartmann procedure and creation of colostomy  . NASAL SEPTOPLASTY W/ TURBINOPLASTY Bilateral 11/11/2015   Procedure: SEPTOPLASTY BILATERAL TURBINATE  REDUCTION ;  Surgeon: Leta Baptist, MD;  Location: [place];  Service: ENT;  Laterality: Bilateral;  . NASAL SINUS SURGERY Bilateral 11/11/2015   Procedure: BILATERAL ENDOSCOPIC  MAXILLARY ANTROSTOMY;  Surgeon: Leta Baptist, MD;  Location: [place];  Service:  ENT;  Laterality: Bilateral;  . TONSILLECTOMY AND ADENOIDECTOMY    . TUBAL LIGATION    . UPPER GASTROINTESTINAL ENDOSCOPY  01/19/12     Family History  Problem Relation Age of Onset  . Heart disease Father   . Osteoporosis Father   . Osteoporosis Mother   . Mental illness Paternal Aunt   . Skin cancer Maternal Grandmother   . Hypertension Maternal Grandfather   . Heart disease Paternal Grandfather   . Celiac disease Grandchild     Social History   Tobacco Use  . Smoking status: Never Smoker  . Smokeless tobacco: Never Used    Substance Use Topics  . Alcohol use: Yes    Alcohol/week: 4.0 - 5.0 standard drinks    Types: 4 - 5 Standard drinks or equivalent per week  . Drug use: No    Review of Systems:   Pertinent items are noted in the HPI. Otherwise, ROS is negative.  Objective:   BP 118/72   Pulse 75   Temp (!) 96.7 F (35.9 C) (Oral)   Ht 5' 4"  (1.626 m)   Wt 136 lb 9.6 oz (62 kg)   LMP 05/24/2006   SpO2 98%   BMI 23.45 kg/m   General appearance: alert, cooperative and appears stated age. Head: normocephalic, without obvious abnormality, atraumatic. Neck: no adenopathy, supple, symmetrical, trachea midline; thyroid not enlarged, symmetric, no tenderness/mass/nodules. Lungs: clear to auscultation bilaterally. Heart: regular rate and rhythm Abdomen: soft, non-tender; no masses,  no organomegaly. Extremities: extremities normal, atraumatic, no cyanosis or edema. Skin: skin color, texture, turgor normal, no rashes or lesions. Lymph: cervical, supraclavicular, and axillary nodes normal; no abnormal inguinal nodes palpated. Neurologic: grossly normal.                                      Assessment/Plan:   Remedy was seen today for annual exam.  Diagnoses and all orders for this visit:  Routine physical examination  High risk medication use -     CBC with Differential/Platelet -     Comprehensive metabolic panel  Encounter for immunization -     Flu Vaccine MDCK QUAD PF  Screening for lipid disorders -     Lipid panel  Dysthymia -     FLUoxetine (PROZAC) 10 MG capsule; Take 3 capsules (30 mg total) by mouth daily. Takes 3 tablets in AM    Patient Counseling: [x]    Nutrition: Stressed importance of moderation in sodium/caffeine intake, saturated fat and cholesterol, caloric balance, sufficient intake of fresh fruits, vegetables, fiber, calcium, iron, and 1 mg of folate supplement per day (for females capable of pregnancy).  [x]    Stressed the importance of regular exercise.   [x]     Substance Abuse: Discussed cessation/primary prevention of tobacco, alcohol, or other drug use; driving or other dangerous activities under the influence; availability of treatment for abuse.   [x]    Injury prevention: Discussed safety belts, safety helmets, smoke detector, smoking near bedding or upholstery.   [x]    Sexuality: Discussed sexually transmitted diseases, partner selection, use of condoms, avoidance of unintended pregnancy  and contraceptive alternatives.  [x]    Dental health: Discussed importance of regular tooth brushing, flossing, and dental visits.  [x]    Health maintenance and immunizations reviewed. Please refer to Health maintenance section.   Briscoe Deutscher, DO [place]

## 2019-02-07 ENCOUNTER — Ambulatory Visit (INDEPENDENT_AMBULATORY_CARE_PROVIDER_SITE_OTHER): Payer: Medicare Other | Admitting: Family Medicine

## 2019-02-07 ENCOUNTER — Other Ambulatory Visit: Payer: Self-pay

## 2019-02-07 ENCOUNTER — Encounter: Payer: Self-pay | Admitting: Family Medicine

## 2019-02-07 VITALS — BP 118/72 | HR 75 | Temp 96.7°F | Ht 64.0 in | Wt 136.6 lb

## 2019-02-07 DIAGNOSIS — Z13220 Encounter for screening for lipoid disorders: Secondary | ICD-10-CM | POA: Diagnosis not present

## 2019-02-07 DIAGNOSIS — Z23 Encounter for immunization: Secondary | ICD-10-CM

## 2019-02-07 DIAGNOSIS — Z0000 Encounter for general adult medical examination without abnormal findings: Secondary | ICD-10-CM

## 2019-02-07 DIAGNOSIS — F341 Dysthymic disorder: Secondary | ICD-10-CM

## 2019-02-07 DIAGNOSIS — Z79899 Other long term (current) drug therapy: Secondary | ICD-10-CM | POA: Diagnosis not present

## 2019-02-07 LAB — CBC WITH DIFFERENTIAL/PLATELET
Basophils Absolute: 0 10*3/uL (ref 0.0–0.1)
Basophils Relative: 0.3 % (ref 0.0–3.0)
Eosinophils Absolute: 0 10*3/uL (ref 0.0–0.7)
Eosinophils Relative: 1.6 % (ref 0.0–5.0)
HCT: 36.3 % (ref 36.0–46.0)
Hemoglobin: 12.2 g/dL (ref 12.0–15.0)
Lymphocytes Relative: 16 % (ref 12.0–46.0)
Lymphs Abs: 0.4 10*3/uL — ABNORMAL LOW (ref 0.7–4.0)
MCHC: 33.8 g/dL (ref 30.0–36.0)
MCV: 95.3 fl (ref 78.0–100.0)
Monocytes Absolute: 0.3 10*3/uL (ref 0.1–1.0)
Monocytes Relative: 11.2 % (ref 3.0–12.0)
Neutro Abs: 1.9 10*3/uL (ref 1.4–7.7)
Neutrophils Relative %: 70.9 % (ref 43.0–77.0)
Platelets: 186 10*3/uL (ref 150.0–400.0)
RBC: 3.8 Mil/uL — ABNORMAL LOW (ref 3.87–5.11)
RDW: 13 % (ref 11.5–15.5)
WBC: 2.6 10*3/uL — ABNORMAL LOW (ref 4.0–10.5)

## 2019-02-07 LAB — LIPID PANEL
Cholesterol: 184 mg/dL (ref 0–200)
HDL: 53.2 mg/dL (ref >39.00)
LDL Cholesterol: 94 mg/dL (ref 0–99)
NonHDL: 130.54
Total CHOL/HDL Ratio: 3
Triglycerides: 183 mg/dL — ABNORMAL HIGH (ref 0.0–149.0)
VLDL: 36.6 mg/dL (ref 0.0–40.0)

## 2019-02-07 LAB — COMPREHENSIVE METABOLIC PANEL
ALT: 15 U/L (ref 0–35)
AST: 18 U/L (ref 0–37)
Albumin: 4 g/dL (ref 3.5–5.2)
Alkaline Phosphatase: 91 U/L (ref 39–117)
BUN: 16 mg/dL (ref 6–23)
CO2: 28 mEq/L (ref 19–32)
Calcium: 9 mg/dL (ref 8.4–10.5)
Chloride: 101 mEq/L (ref 96–112)
Creatinine, Ser: 0.52 mg/dL (ref 0.40–1.20)
GFR: 117.43 mL/min (ref >60.00)
Glucose, Bld: 98 mg/dL (ref 70–99)
Potassium: 4.4 mEq/L (ref 3.5–5.1)
Sodium: 136 mEq/L (ref 135–145)
Total Bilirubin: 0.5 mg/dL (ref 0.2–1.2)
Total Protein: 6.5 g/dL (ref 6.0–8.3)

## 2019-02-07 MED ORDER — FLUOXETINE HCL 10 MG PO CAPS: 30.0000 mg | ORAL_CAPSULE | Freq: Every day | ORAL | 3 refills | Status: DC

## 2019-02-07 NOTE — Progress Notes (Signed)
   Subjective:  Patient presents today status post Akin and 2nd metatarsal osteotomies left. DOS: 12/07/2018. She states she is doing well. If she is on her feet for long periods of time she experiences some mild pain. She has using the post op shoe as directed. Patient is here for further evaluation and treatment.    Past Medical History:  Diagnosis Date  . Allergic rhinitis   . Anxiety   . Asthma   . Bipolar disorder ([place])   . Celiac disease   . Depression   . Elevated liver function tests 2019  . Elevated testosterone level 2019  . Elevated vitamin B12 level 2019  . History of colonic polyps   . Hypothyroidism   . Ileus following gastrointestinal surgery ([place]) 10/18/2011  . Leukopenia   . Low TSH level 2019  . Malnutrition following gastrointestinal surgery 10/18/2011  . Osteoporosis 2018  . RBBB (right bundle branch block)   . Renal stone 2020      Objective/Physical Exam Neurovascular status intact.  Skin incisions appear to be well coapted. No sign of infectious process noted. No dehiscence. No active bleeding noted. Moderate edema noted to the surgical extremity.  Radiographic Exam:  Orthopedic hardware and osteotomies sites appear to be stable with routine healing.  Assessment: 1. s/p Akin and 2nd metatarsal osteotomies left. DOS: 12/07/2018   Plan of Care:  1. Patient was evaluated. X-Rays reviewed.  2. May resume full activity with no restrictions.  3. Recommended good shoe gear.  4. Return to clinic as needed.     Edrick Kins, DPM [facility]  Dr. Edrick Kins, [address]                                        [place], [place] [address]                Office [phone]  Fax [phone]

## 2019-02-11 ENCOUNTER — Encounter: Payer: Self-pay | Admitting: Family Medicine

## 2019-02-13 ENCOUNTER — Ambulatory Visit: Payer: Medicare Other

## 2019-02-21 ENCOUNTER — Other Ambulatory Visit (INDEPENDENT_AMBULATORY_CARE_PROVIDER_SITE_OTHER): Payer: Medicare Other

## 2019-02-21 ENCOUNTER — Other Ambulatory Visit: Payer: Self-pay

## 2019-02-21 DIAGNOSIS — D72819 Decreased white blood cell count, unspecified: Secondary | ICD-10-CM

## 2019-02-21 DIAGNOSIS — R899 Unspecified abnormal finding in specimens from other organs, systems and tissues: Secondary | ICD-10-CM

## 2019-02-21 LAB — CBC WITH DIFFERENTIAL/PLATELET
Basophils Absolute: 0 10*3/uL (ref 0.0–0.1)
Basophils Relative: 0.5 % (ref 0.0–3.0)
Eosinophils Absolute: 0 10*3/uL (ref 0.0–0.7)
Eosinophils Relative: 1.9 % (ref 0.0–5.0)
HCT: 34.7 % — ABNORMAL LOW (ref 36.0–46.0)
Hemoglobin: 11.6 g/dL — ABNORMAL LOW (ref 12.0–15.0)
Lymphocytes Relative: 20 % (ref 12.0–46.0)
Lymphs Abs: 0.4 10*3/uL — ABNORMAL LOW (ref 0.7–4.0)
MCHC: 33.5 g/dL (ref 30.0–36.0)
MCV: 94.9 fl (ref 78.0–100.0)
Monocytes Absolute: 0.3 10*3/uL (ref 0.1–1.0)
Monocytes Relative: 15 % — ABNORMAL HIGH (ref 3.0–12.0)
Neutro Abs: 1.2 10*3/uL — ABNORMAL LOW (ref 1.4–7.7)
Neutrophils Relative %: 62.6 % (ref 43.0–77.0)
Platelets: 165 10*3/uL (ref 150.0–400.0)
RBC: 3.66 Mil/uL — ABNORMAL LOW (ref 3.87–5.11)
RDW: 12.9 % (ref 11.5–15.5)
WBC: 1.9 10*3/uL — CL (ref 4.0–10.5)

## 2019-02-21 NOTE — Progress Notes (Signed)
path

## 2019-02-21 NOTE — Patient Instructions (Signed)
There are no preventive care reminders to display for this patient.  Depression screen [facility] 2/9 02/07/2019 07/18/2018 05/09/2018  Decreased Interest 0 0 0  Down, Depressed, Hopeless 0 0 0  PHQ - 2 Score 0 0 0  Altered sleeping 0 0 0  Tired, decreased energy 0 0 0  Change in appetite 0 0 0  Feeling bad or failure about yourself  0 0 0  Trouble concentrating 0 0 0  Moving slowly or fidgety/restless 0 0 0  Suicidal thoughts 0 0 -  PHQ-9 Score 0 0 0  Difficult doing work/chores Not difficult at all Not difficult at all Not difficult at all  Some recent data might be hidden

## 2019-02-22 ENCOUNTER — Other Ambulatory Visit: Payer: Self-pay

## 2019-02-22 ENCOUNTER — Telehealth: Payer: Self-pay | Admitting: Physician Assistant

## 2019-02-22 DIAGNOSIS — R899 Unspecified abnormal finding in specimens from other organs, systems and tissues: Secondary | ICD-10-CM

## 2019-02-22 LAB — PATHOLOGIST SMEAR REVIEW

## 2019-02-22 NOTE — Telephone Encounter (Signed)
Received an urgent referral from Dr. Juleen China for low wbc. Ms. Mary Leach has been cld and scheduled to see Cassie and Dr. Julien Nordmann on 10/2 at 930am. She's been made aware to arrive 20 minutes early.

## 2019-02-23 ENCOUNTER — Inpatient Hospital Stay: Payer: Medicare Other | Attending: Physician Assistant | Admitting: Physician Assistant

## 2019-02-23 ENCOUNTER — Telehealth: Payer: Self-pay | Admitting: Physician Assistant

## 2019-02-23 ENCOUNTER — Inpatient Hospital Stay: Payer: Medicare Other

## 2019-02-23 ENCOUNTER — Other Ambulatory Visit: Payer: Self-pay

## 2019-02-23 ENCOUNTER — Encounter: Payer: Self-pay | Admitting: Physician Assistant

## 2019-02-23 VITALS — BP 133/72 | HR 68 | Temp 98.2°F | Resp 17 | Ht 64.0 in | Wt 135.2 lb

## 2019-02-23 DIAGNOSIS — Z114 Encounter for screening for human immunodeficiency virus [HIV]: Secondary | ICD-10-CM

## 2019-02-23 DIAGNOSIS — Z882 Allergy status to sulfonamides status: Secondary | ICD-10-CM | POA: Diagnosis not present

## 2019-02-23 DIAGNOSIS — Z832 Family history of diseases of the blood and blood-forming organs and certain disorders involving the immune mechanism: Secondary | ICD-10-CM | POA: Insufficient documentation

## 2019-02-23 DIAGNOSIS — D708 Other neutropenia: Secondary | ICD-10-CM

## 2019-02-23 DIAGNOSIS — Z87442 Personal history of urinary calculi: Secondary | ICD-10-CM | POA: Insufficient documentation

## 2019-02-23 DIAGNOSIS — R5383 Other fatigue: Secondary | ICD-10-CM | POA: Insufficient documentation

## 2019-02-23 DIAGNOSIS — Z86010 Personal history of colonic polyps: Secondary | ICD-10-CM | POA: Diagnosis not present

## 2019-02-23 DIAGNOSIS — Z881 Allergy status to other antibiotic agents status: Secondary | ICD-10-CM | POA: Insufficient documentation

## 2019-02-23 DIAGNOSIS — K900 Celiac disease: Secondary | ICD-10-CM | POA: Insufficient documentation

## 2019-02-23 DIAGNOSIS — D649 Anemia, unspecified: Secondary | ICD-10-CM

## 2019-02-23 DIAGNOSIS — Z818 Family history of other mental and behavioral disorders: Secondary | ICD-10-CM

## 2019-02-23 DIAGNOSIS — D539 Nutritional anemia, unspecified: Secondary | ICD-10-CM

## 2019-02-23 DIAGNOSIS — Z79899 Other long term (current) drug therapy: Secondary | ICD-10-CM | POA: Insufficient documentation

## 2019-02-23 DIAGNOSIS — D72819 Decreased white blood cell count, unspecified: Secondary | ICD-10-CM | POA: Diagnosis present

## 2019-02-23 DIAGNOSIS — Z880 Allergy status to penicillin: Secondary | ICD-10-CM | POA: Diagnosis not present

## 2019-02-23 DIAGNOSIS — R112 Nausea with vomiting, unspecified: Secondary | ICD-10-CM

## 2019-02-23 DIAGNOSIS — Z888 Allergy status to other drugs, medicaments and biological substances status: Secondary | ICD-10-CM | POA: Insufficient documentation

## 2019-02-23 DIAGNOSIS — Z808 Family history of malignant neoplasm of other organs or systems: Secondary | ICD-10-CM

## 2019-02-23 DIAGNOSIS — Z8262 Family history of osteoporosis: Secondary | ICD-10-CM

## 2019-02-23 DIAGNOSIS — R634 Abnormal weight loss: Secondary | ICD-10-CM

## 2019-02-23 DIAGNOSIS — Z8379 Family history of other diseases of the digestive system: Secondary | ICD-10-CM

## 2019-02-23 DIAGNOSIS — Z7289 Other problems related to lifestyle: Secondary | ICD-10-CM

## 2019-02-23 DIAGNOSIS — Z8249 Family history of ischemic heart disease and other diseases of the circulatory system: Secondary | ICD-10-CM

## 2019-02-23 LAB — CBC WITH DIFFERENTIAL (CANCER CENTER ONLY)
Abs Immature Granulocytes: 0 10*3/uL (ref 0.00–0.07)
Basophils Absolute: 0 10*3/uL (ref 0.0–0.1)
Basophils Relative: 0 %
Eosinophils Absolute: 0 10*3/uL (ref 0.0–0.5)
Eosinophils Relative: 1 %
HCT: 36.8 % (ref 36.0–46.0)
Hemoglobin: 12.4 g/dL (ref 12.0–15.0)
Immature Granulocytes: 0 %
Lymphocytes Relative: 14 %
Lymphs Abs: 0.4 10*3/uL — ABNORMAL LOW (ref 0.7–4.0)
MCH: 31.6 pg (ref 26.0–34.0)
MCHC: 33.7 g/dL (ref 30.0–36.0)
MCV: 93.6 fL (ref 80.0–100.0)
Monocytes Absolute: 0.3 10*3/uL (ref 0.1–1.0)
Monocytes Relative: 12 %
Neutro Abs: 2 10*3/uL (ref 1.7–7.7)
Neutrophils Relative %: 73 %
Platelet Count: 174 10*3/uL (ref 150–400)
RBC: 3.93 MIL/uL (ref 3.87–5.11)
RDW: 12.1 % (ref 11.5–15.5)
WBC Count: 2.8 10*3/uL — ABNORMAL LOW (ref 4.0–10.5)
nRBC: 0 % (ref 0.0–0.2)

## 2019-02-23 LAB — HEPATITIS PANEL, ACUTE
HCV Ab: NONREACTIVE
Hep A IgM: NONREACTIVE
Hep B C IgM: NONREACTIVE
Hepatitis B Surface Ag: NONREACTIVE

## 2019-02-23 LAB — CMP (CANCER CENTER ONLY)
ALT: 20 U/L (ref 0–44)
AST: 24 U/L (ref 15–41)
Albumin: 4.2 g/dL (ref 3.5–5.0)
Alkaline Phosphatase: 87 U/L (ref 38–126)
Anion gap: 6 (ref 5–15)
BUN: 19 mg/dL (ref 8–23)
CO2: 25 mmol/L (ref 22–32)
Calcium: 9 mg/dL (ref 8.9–10.3)
Chloride: 105 mmol/L (ref 98–111)
Creatinine: 1.07 mg/dL — ABNORMAL HIGH (ref 0.44–1.00)
GFR, Est AFR Am: >60 mL/min (ref >60)
GFR, Estimated: 54 mL/min — ABNORMAL LOW (ref >60)
Glucose, Bld: 94 mg/dL (ref 70–99)
Potassium: 4.1 mmol/L (ref 3.5–5.1)
Sodium: 136 mmol/L (ref 135–145)
Total Bilirubin: 0.5 mg/dL (ref 0.3–1.2)
Total Protein: 7.2 g/dL (ref 6.5–8.1)

## 2019-02-23 LAB — LACTATE DEHYDROGENASE: LDH: 201 U/L — ABNORMAL HIGH (ref 98–192)

## 2019-02-23 LAB — HIV ANTIBODY (ROUTINE TESTING W REFLEX): HIV Screen 4th Generation wRfx: NONREACTIVE

## 2019-02-23 LAB — IRON AND TIBC
Iron: 89 ug/dL (ref 41–142)
Saturation Ratios: 27 % (ref 21–57)
TIBC: 329 ug/dL (ref 236–444)
UIBC: 240 ug/dL (ref 120–384)

## 2019-02-23 LAB — FERRITIN: Ferritin: 69 ng/mL (ref 11–307)

## 2019-02-23 LAB — FOLATE: Folate: 19.9 ng/mL (ref >5.9)

## 2019-02-23 LAB — VITAMIN B12: Vitamin B-12: 592 pg/mL (ref 180–914)

## 2019-02-23 NOTE — Progress Notes (Signed)
[facility] [phone]   [phone]  CONSULT NOTE  REFERRING PHYSICIAN: Dr. Juleen China  REASON FOR CONSULTATION:  Leukopenia  HPI Mary Leach is a 68 y.o. female with a past medical history significant for celiacs disease,  hx of vitamin B12 deficiency, allergic rhinitis, depression, hypothyroidism, ruptured bowel w/bowel resection, and osteoporosis presents to the clinic for evaluation of leukopenia. The patient had presented to her primary care provider for an annual physical in September 16th, 2020. Routine labs demonstrated a low WBC of 2.6. This was rechecked on September 30th which again  revealed leukopenia with a WBC of 1.9, Hbg of 11.6, platelets WNL of 165, and an ANC of 1.2. The patient was referred to the clinic for further evaluation and recommendations regarding this finding. Per chart review, the patient does not have a prior history of leukopenia. The patient denies any new medications, recent infections, or known chemical/drug exposures.  The patient is feeling well today without any concerning complaints except for fatigue. She also had an episode of vertigo last week with associated nausea and vomiting which resolved on its own without any medications/interventions. Otherwise, she has been feeling pretty healthy. She denies any fever, chills, night sweats, lymphadenopathy, or weight loss. She denies any signs or symptoms of infection including nasal congestion, sore throat, cough, shortness of breath, abdominal pain, diarrhea, dysuria, or skin infections. She denies any joint swelling or rashes. She denies any recent viral infections. She denies any previously diagnosed rheumatologic disorders except for celiacs disease. She is not on any immunosuppressant drugs. She has a history of vitamin B12 deficiency that was diagnosed approximately 4 years ago. She does not take any vitamin B12 supplements. She denies any history of bariatric surgery but  had her colon resected in 2013 after experiencing a ruptured colon. She denies any certain dietary restrictions/habits.  The patient's family history is significant for her father who had myelodysplastic syndrome and CHF. Her mother had atrial fibrillation. The patient's daughter has celiacs disease. The patient's son has systemic lupus erythematosus. The patient used to work in Community education officer as well as she worked for a company that [employer]. She denies any known work related chemical/drug exposures. She is married and has 2 children.  She has no history of tobacco use or drug use. She drinks approximately 5 glasses of wine per week.      HPI  Past Medical History:  Diagnosis Date   Allergic rhinitis    Anxiety    Asthma    Bipolar disorder ([place])    Celiac disease    Depression    Elevated liver function tests 2019   Elevated testosterone level 2019   Elevated vitamin B12 level 2019   History of colonic polyps    Hypothyroidism    Ileus following gastrointestinal surgery ([place]) 10/18/2011   Leukopenia    Low TSH level 2019   Malnutrition following gastrointestinal surgery 10/18/2011   Osteoporosis 2018   RBBB (right bundle branch block)    Renal stone 2020    Past Surgical History:  Procedure Laterality Date   COLON SURGERY     COLONOSCOPY W/ POLYPECTOMY     COLOSTOMY TAKEDOWN  05/29/2012   Procedure: LAPAROSCOPIC COLOSTOMY TAKEDOWN;  Surgeon: Odis Hollingshead, MD;  Location: [facility];  Service: General;  Laterality: N/A;  Laparoscopic Assisted Colostomy Closure  with proctoscopy   CYSTECTOMY     LEFT FOOT, RIGHT HAND   DILATATION & CURETTAGE/HYSTEROSCOPY WITH MYOSURE N/A 07/25/2018  Procedure: DILATATION & CURETTAGE/HYSTEROSCOPY WITH MYOSURE RESECTION OF ENDOMETRIAL AND ENDOCERVICAL POLYP;  Surgeon: Nunzio Cobbs, MD;  Location[facility];  Service: Gynecology;  Laterality: N/A;   FOOT SURGERY     FOOT SURGERY Left 12/07/2018    cyst removed   LAPAROTOMY  10/07/2011   Procedure: EXPLORATORY LAPAROTOMY;  Surgeon: Odis Hollingshead, MD;  Location: [facility];  Service: General;  Laterality: N/A;  hartmann procedure and creation of colostomy   NASAL SEPTOPLASTY W/ TURBINOPLASTY Bilateral 11/11/2015   Procedure: SEPTOPLASTY BILATERAL TURBINATE  REDUCTION ;  Surgeon: Leta Baptist, MD;  Location: [facility];  Service: ENT;  Laterality: Bilateral;   NASAL SINUS SURGERY Bilateral 11/11/2015   Procedure: BILATERAL ENDOSCOPIC  MAXILLARY ANTROSTOMY;  Surgeon: Leta Baptist, MD;  Location: [facility];  Service: ENT;  Laterality: Bilateral;   TONSILLECTOMY AND ADENOIDECTOMY     TUBAL LIGATION     UPPER GASTROINTESTINAL ENDOSCOPY  01/19/12    Family History  Problem Relation Age of Onset   Heart disease Father    Osteoporosis Father    Osteoporosis Mother    Mental illness Paternal Aunt    Skin cancer Maternal Grandmother    Hypertension Maternal Grandfather    Heart disease Paternal Grandfather    Celiac disease Grandchild     Social History Social History   Tobacco Use   Smoking status: Never Smoker   Smokeless tobacco: Never Used  Substance Use Topics   Alcohol use: Yes    Alcohol/week: 4.0 - 5.0 standard drinks    Types: 4 - 5 Standard drinks or equivalent per week   Drug use: No    Allergies  Allergen Reactions   Augmentin [Amoxicillin-Pot Clavulanate] Diarrhea   Chocolate    Other     **Celiac Disease* Multiple food intolerances. Eggs, corn, potatoes, yeast, millett, buckwheat, sesame , soy.  Also observes a dairy and wheat free diet.   Prevnar [Pneumococcal 13-Val Conj Vacc]     Vaccine contains yeast    Sulfonamide Derivatives Rash    Arms only.   Tape Rash    Current Outpatient Medications  Medication Sig Dispense Refill   ADVAIR DISKUS 100-50 MCG/DOSE AEPB Inhale 1 puff into the lungs 2 (two) times daily.      ARMOUR THYROID 120 MG tablet Take 1  tablet by mouth daily.     Azelastine-Fluticasone (DYMISTA) 137-50 MCG/ACT SUSP Place 2 sprays into the nose daily.      cetirizine (ZYRTEC) 10 MG tablet Take 10 mg by mouth every evening.      EPINEPHrine (EPI-PEN) 0.3 mg/0.3 mL DEVI Inject 0.3 mg into the muscle once. For previous allergies, maybe gluten     FLUoxetine (PROZAC) 10 MG capsule Take 3 capsules (30 mg total) by mouth daily. Takes 3 tablets in AM 270 capsule 3   magnesium gluconate (MAGONATE) 500 MG tablet Take 1,500 mg by mouth every evening.      Multiple Minerals-Vitamins (NUTRA-SUPPORT BONE) CAPS Take 1-2 tablets by mouth See admin instructions. Take 1 tablet every morning and 2 tablets every night     Probiotic Product (PROBIOTIC DAILY PO) Take by mouth. metaflora IB     vitamin C (ASCORBIC ACID) 500 MG tablet Take 500 mg by mouth daily.     No current facility-administered medications for this visit.     Review of Systems REVIEW OF SYSTEMS:   Review of Systems  Constitutional: Positive for fatigue. Negative for appetite change, chills,  fever and unexpected weight change.  HENT: Negative for mouth sores, nosebleeds, sore throat and trouble swallowing.   Eyes: Negative for diplopia, erythema, pruritic eyes, eye pain, or icterus.  Respiratory: Negative for cough, hemoptysis, shortness of breath and wheezing.   Cardiovascular: Negative for chest pain and leg swelling.  Gastrointestinal: Positive for nausea/vomiting secondary to vertigo last week (resolved). Negative for abdominal pain, constipation, or diarrhea,  Genitourinary: Negative for bladder incontinence, difficulty urinating, dysuria, frequency and hematuria.   Musculoskeletal: Negative for back pain, gait problem, neck pain and neck stiffness.  Skin: Negative for itching and rash.  Neurological: Positive for vertigo last week (resolved). Negative for extremity weakness, gait problem, headaches, light-headedness and seizures.  Hematological: Does not  bruise/bleed easily.  Psychiatric/Behavioral: Negative for confusion, depression and sleep disturbance. The patient is not nervous/anxious.    Physical Exam  Physical Exam  Constitutional: Oriented to person, place, and time and well-developed, well-nourished, and in no distress.  HENT:  Head: Normocephalic and atraumatic.  Mouth/Throat: Oropharynx is clear and moist. No oropharyngeal exudate.  Eyes: Conjunctivae are normal. Right eye exhibits no discharge. Left eye exhibits no discharge. No scleral icterus.  Neck: Normal range of motion. Neck supple.  Cardiovascular: Normal rate, regular rhythm, normal heart sounds and intact distal pulses.   Pulmonary/Chest: Effort normal and breath sounds normal. No respiratory distress. No wheezes. No rales.  Abdominal: Soft. Bowel sounds are normal. Exhibits no distension and no mass. There is no tenderness.  Musculoskeletal: Normal range of motion. Exhibits no edema.  Lymphadenopathy:   No palpable cervical or axillary lymphadenopathy. Neurological: Alert and oriented to person, place, and time. Exhibits normal muscle tone. Gait normal. Coordination normal.  Skin: Skin is warm and dry. No rash noted. Not diaphoretic. No erythema. No pallor.  Psychiatric: Mood, memory and judgment normal.  Vitals reviewed.  PERFORMANCE STATUS: ECOG 0  LABORATORY DATA: Lab Results  Component Value Date   WBC 1.9 Repeated and verified X2. (LL) 02/21/2019   HGB 11.6 (L) 02/21/2019   HCT 34.7 (L) 02/21/2019   MCV 94.9 02/21/2019   PLT 165.0 02/21/2019      Chemistry      Component Value Date/Time   NA 136 02/07/2019 1402   NA 134 (A) 07/22/2018   K 4.4 02/07/2019 1402   CL 101 02/07/2019 1402   CO2 28 02/07/2019 1402   BUN 16 02/07/2019 1402   BUN 24 (A) 07/22/2018   CREATININE 0.52 02/07/2019 1402   CREATININE 0.57 12/26/2014 1423   GLU 92 07/22/2018      Component Value Date/Time   CALCIUM 9.0 02/07/2019 1402   ALKPHOS 91 02/07/2019 1402   AST  18 02/07/2019 1402   ALT 15 02/07/2019 1402   BILITOT 0.5 02/07/2019 1402   BILITOT 0.6 04/01/2017 1409       RADIOGRAPHIC STUDIES: Dg Foot Complete Left  Result Date: 02/05/2019 Please see detailed radiograph report in office note.   ASSESSMENT: This is a very pleasant 69 year old Caucasian female with leukopenia. Pending further evaluation into the etiology. First noted on routine labs in September 2020.    PLAN: The patient was seen today with Dr. Julien Nordmann. Dr. Julien Nordmann had a lengthy discussion with the patient about her current and condition and recommendations for further investigation to determine the etiology of her leukopenia. Dr. Julien Nordmann recommends ordering several studies today including a repeat CBC, CMP, LDH, folate, vitamin B12, iron panel, ferritin, HIV antibody, methylmalonic acid, hepatitis panel, rheumatoid factor, and ANA with  reflex.  The patient's CBC from today showed persistently low, but improved WBC to 2.8 today compared to 1.9 from a few days prior.   We will see the patient back for a follow up visit in about 2 weeks for evaluation and to review the patient's lab studies. We will also repeat a CBC at that time.  If the patient's lab work at her follow up visit shows persistent and worsening leukopenia with no clear underlying etiology from today's lab studies, Dr. Julien Nordmann spoke to the patient about possible discussion/consideration of a bone marrow biopsy in the future.   The patient voices understanding of current disease status and treatment options and is in agreement with the current care plan.  All questions were answered. The patient knows to call the clinic with any problems, questions or concerns. We can certainly see the patient much sooner if necessary.  Thank you so much for allowing me to participate in the care of Mary Leach. I will continue to follow up the patient with you and assist in her care.  I spent 40 minutes counseling the patient face  to face. The total time spent in the appointment was 60 minutes.  Disclaimer: This note was dictated with voice recognition software. Similar sounding words can inadvertently be transcribed and may not be corrected upon review.   Eastyn Skalla L Casimir Barcellos February 23, 2019, 8:31 AM  ADDENDUM: Hematology/Oncology Attending: I had a face-to-face encounter with the patient today.  I recommended her care plan.  This is a very pleasant 68 years old white female presented for evaluation of leukocytopenia.  She has a family history of myelodysplastic syndrome with her father.  The patient was seen recently by her primary care physician for routine annual exam and CBC at that time showed white blood count of 2.6.  She had repeat CBC a week later and it showed further decrease in her total white blood count to 1.9.  With absolute neutrophil count of 1.2. The patient was referred to [place] today for evaluation and recommendation regarding her condition. Repeat CBC today showed improvement of her total white blood count to 2.8 with normal absolute neutrophil count of 2000. We ordered several studies to identify the underlying etiology of her anemia including vitamin U88 level, folic acid, methylmalonic acid, iron study, ferritin, hepatitis panel, HIV, rheumatoid factor and ANA. We will arrange for the patient to come back for follow-up visit in 2 weeks for reevaluation and repeat CBC and also discussion of her condition based on the pending lab results. She was advised to call immediately if she has any concerning symptoms in the interval.  Disclaimer: This note was dictated with voice recognition software. Similar sounding words can inadvertently be transcribed and may be missed upon review. Eilleen Kempf, MD 02/23/19

## 2019-02-23 NOTE — Telephone Encounter (Signed)
Scheduled appt per 10/2 los - pt aware of appt date and time

## 2019-02-24 LAB — RHEUMATOID FACTOR: Rheumatoid fact SerPl-aCnc: <10 IU/mL (ref 0.0–13.9)

## 2019-02-26 LAB — ANTINUCLEAR ANTIBODIES, IFA: ANA Ab, IFA: NEGATIVE

## 2019-03-01 LAB — METHYLMALONIC ACID, SERUM: Methylmalonic Acid, Quantitative: 175 nmol/L (ref 0–378)

## 2019-03-05 ENCOUNTER — Telehealth: Payer: Self-pay | Admitting: Internal Medicine

## 2019-03-05 NOTE — Telephone Encounter (Signed)
Returned patient's phone call regarding rescheduling 10/19 appointment, per patient's request appointment has moved to 10/23.

## 2019-03-07 ENCOUNTER — Telehealth: Payer: Self-pay | Admitting: Physician Assistant

## 2019-03-07 NOTE — Telephone Encounter (Signed)
R/s appt per 10/13 [facility] message - unable to reach p t - left message with appt date and time ./ appt rescheduled due to [facility] only in office in the AM on fridays -

## 2019-03-12 ENCOUNTER — Other Ambulatory Visit: Payer: Medicare Other

## 2019-03-12 ENCOUNTER — Ambulatory Visit: Payer: Medicare Other | Admitting: Internal Medicine

## 2019-03-16 ENCOUNTER — Inpatient Hospital Stay: Payer: Medicare Other

## 2019-03-16 ENCOUNTER — Encounter: Payer: Self-pay | Admitting: Physician Assistant

## 2019-03-16 ENCOUNTER — Inpatient Hospital Stay (HOSPITAL_BASED_OUTPATIENT_CLINIC_OR_DEPARTMENT_OTHER): Payer: Medicare Other | Admitting: Physician Assistant

## 2019-03-16 ENCOUNTER — Telehealth: Payer: Self-pay | Admitting: Physician Assistant

## 2019-03-16 ENCOUNTER — Ambulatory Visit: Payer: Medicare Other | Admitting: Physician Assistant

## 2019-03-16 ENCOUNTER — Other Ambulatory Visit: Payer: Self-pay

## 2019-03-16 ENCOUNTER — Other Ambulatory Visit: Payer: Medicare Other

## 2019-03-16 VITALS — BP 126/64 | HR 71 | Temp 98.3°F | Resp 18 | Ht 64.0 in | Wt 135.9 lb

## 2019-03-16 DIAGNOSIS — D72819 Decreased white blood cell count, unspecified: Secondary | ICD-10-CM

## 2019-03-16 LAB — CBC WITH DIFFERENTIAL (CANCER CENTER ONLY)
Abs Immature Granulocytes: 0 10*3/uL (ref 0.00–0.07)
Basophils Absolute: 0 10*3/uL (ref 0.0–0.1)
Basophils Relative: 1 %
Eosinophils Absolute: 0.1 10*3/uL (ref 0.0–0.5)
Eosinophils Relative: 3 %
HCT: 34.4 % — ABNORMAL LOW (ref 36.0–46.0)
Hemoglobin: 11.6 g/dL — ABNORMAL LOW (ref 12.0–15.0)
Immature Granulocytes: 0 %
Lymphocytes Relative: 19 %
Lymphs Abs: 0.4 10*3/uL — ABNORMAL LOW (ref 0.7–4.0)
MCH: 31.8 pg (ref 26.0–34.0)
MCHC: 33.7 g/dL (ref 30.0–36.0)
MCV: 94.2 fL (ref 80.0–100.0)
Monocytes Absolute: 0.3 10*3/uL (ref 0.1–1.0)
Monocytes Relative: 17 %
Neutro Abs: 1.1 10*3/uL — ABNORMAL LOW (ref 1.7–7.7)
Neutrophils Relative %: 60 %
Platelet Count: 176 10*3/uL (ref 150–400)
RBC: 3.65 MIL/uL — ABNORMAL LOW (ref 3.87–5.11)
RDW: 12.6 % (ref 11.5–15.5)
WBC Count: 1.9 10*3/uL — ABNORMAL LOW (ref 4.0–10.5)
nRBC: 0 % (ref 0.0–0.2)

## 2019-03-16 NOTE — Progress Notes (Signed)
[place] OFFICE PROGRESS NOTE  Briscoe Deutscher, DO [place] [id number]  DIAGNOSIS: Leukocytopenia   PRIOR THERAPY: None  CURRENT THERAPY: None  INTERVAL HISTORY: Mary Leach 68 y.o. female returns to the clinic for a follow-up visit.  The patient is feeling well today without any concerning complaints except for mild fatigue.  She denies any fever, chills, night sweats, lymphadenopathy, or weight loss.  She denies any signs or symptoms of infection including nasal congestion, sore throat, cough, shortness of breath, abdominal pain, diarrhea, dysuria, or skin infections.  She was recently referred to the clinic for evaluation of leukocytopenia.  Notably, her father was diagnosed with myelodysplastic syndrome.  She had several lab studies performed recently including a CBC, CMP, LDH, folate, vitamin B12, iron panel, ferritin, HIV antibody testing, methylmalonic acid, hepatitis panel, rheumatoid factor, and ANA.  She is here today for evaluation and to review her labs and recommendations regarding her condition.  MEDICAL HISTORY: Past Medical History:  Diagnosis Date  . Allergic rhinitis   . Anxiety   . Asthma   . Bipolar disorder ([place])   . Celiac disease   . Depression   . Elevated liver function tests 2019  . Elevated testosterone level 2019  . Elevated vitamin B12 level 2019  . History of colonic polyps   . Hypothyroidism   . Ileus following gastrointestinal surgery ([place]) 10/18/2011  . Leukopenia   . Low TSH level 2019  . Malnutrition following gastrointestinal surgery 10/18/2011  . Osteoporosis 2018  . RBBB (right bundle branch block)   . Renal stone 2020    ALLERGIES:  is allergic to augmentin [amoxicillin-pot clavulanate]; chocolate; other; prevnar [pneumococcal 13-val conj vacc]; sulfonamide derivatives; and tape.  MEDICATIONS:  Current Outpatient Medications  Medication Sig Dispense Refill  . ADVAIR DISKUS 100-50 MCG/DOSE AEPB Inhale  1 puff into the lungs 2 (two) times daily.     Francia Greaves THYROID 120 MG tablet Take 1 tablet by mouth daily.    . Azelastine-Fluticasone (DYMISTA) 137-50 MCG/ACT SUSP Place 2 sprays into the nose daily.     . cetirizine (ZYRTEC) 10 MG tablet Take 10 mg by mouth every evening.     Marland Kitchen FLUoxetine (PROZAC) 10 MG capsule Take 3 capsules (30 mg total) by mouth daily. Takes 3 tablets in AM 270 capsule 3  . liothyronine (CYTOMEL) 5 MCG tablet     . magnesium gluconate (MAGONATE) 500 MG tablet Take 1,500 mg by mouth every evening.     . Multiple Minerals-Vitamins (NUTRA-SUPPORT BONE) CAPS Take 1-2 tablets by mouth See admin instructions. Take 1 tablet every morning and 2 tablets every night    . Probiotic Product (PROBIOTIC DAILY PO) Take by mouth. metaflora IB    . vitamin C (ASCORBIC ACID) 500 MG tablet Take 500 mg by mouth daily.    Marland Kitchen azelastine (OPTIVAR) 0.05 % ophthalmic solution as needed.    Marland Kitchen EPINEPHrine (EPI-PEN) 0.3 mg/0.3 mL DEVI Inject 0.3 mg into the muscle once. For previous allergies, maybe gluten     No current facility-administered medications for this visit.     SURGICAL HISTORY:  Past Surgical History:  Procedure Laterality Date  . COLON SURGERY    . COLONOSCOPY W/ POLYPECTOMY    . COLOSTOMY TAKEDOWN  05/29/2012   Procedure: LAPAROSCOPIC COLOSTOMY TAKEDOWN;  Surgeon: Odis Hollingshead, MD;  Location: [facility];  Service: General;  Laterality: N/A;  Laparoscopic Assisted Colostomy Closure  with proctoscopy  . CYSTECTOMY  LEFT FOOT, RIGHT HAND  . DILATATION & CURETTAGE/HYSTEROSCOPY WITH MYOSURE N/A 07/25/2018   Procedure: DILATATION & CURETTAGE/HYSTEROSCOPY WITH MYOSURE RESECTION OF ENDOMETRIAL AND ENDOCERVICAL POLYP;  Surgeon: Nunzio Cobbs, MD;  Location: [facility];  Service: Gynecology;  Laterality: N/A;  . FOOT SURGERY    . FOOT SURGERY Left 12/07/2018   cyst removed  . LAPAROTOMY  10/07/2011   Procedure: EXPLORATORY LAPAROTOMY;  Surgeon: Odis Hollingshead, MD;  Location: [facility];  Service: General;  Laterality: N/A;  hartmann procedure and creation of colostomy  . NASAL SEPTOPLASTY W/ TURBINOPLASTY Bilateral 11/11/2015   Procedure: SEPTOPLASTY BILATERAL TURBINATE  REDUCTION ;  Surgeon: Leta Baptist, MD;  Location: [place];  Service: ENT;  Laterality: Bilateral;  . NASAL SINUS SURGERY Bilateral 11/11/2015   Procedure: BILATERAL ENDOSCOPIC  MAXILLARY ANTROSTOMY;  Surgeon: Leta Baptist, MD;  Location: [place];  Service: ENT;  Laterality: Bilateral;  . TONSILLECTOMY AND ADENOIDECTOMY    . TUBAL LIGATION    . UPPER GASTROINTESTINAL ENDOSCOPY  01/19/12    REVIEW OF SYSTEMS:   Review of Systems  Constitutional: Positive for fatigue. Negative for appetite change, chills, fever and unexpected weight change.  HENT:  Negative for mouth sores, nosebleeds, sore throat and trouble swallowing.   Eyes: Negative for eye problems and icterus.  Respiratory: Negative for cough, hemoptysis, shortness of breath and wheezing.   Cardiovascular: Negative for chest pain and leg swelling.  Gastrointestinal: Negative for abdominal pain, constipation, diarrhea, nausea and vomiting.  Genitourinary: Negative for bladder incontinence, difficulty urinating, dysuria, frequency and hematuria.   Musculoskeletal: Negative for back pain, gait problem, neck pain and neck stiffness.  Skin: Negative for itching and rash.  Neurological: Negative for dizziness, extremity weakness, gait problem, headaches, light-headedness and seizures.  Hematological: Negative for adenopathy. Does not bruise/bleed easily.  Psychiatric/Behavioral: Negative for confusion, depression and sleep disturbance. The patient is not nervous/anxious.     PHYSICAL EXAMINATION:  Blood pressure 126/64, pulse 71, temperature 98.3 F (36.8 C), temperature source Temporal, resp. rate 18, height _0  (1.626 m), weight 135 lb 14.4 oz (61.6 kg), last menstrual period 05/24/2006, SpO2  99 %.  ECOG PERFORMANCE STATUS: 0 - Asymptomatic  Physical Exam  Constitutional: Oriented to person, place, and time and well-developed, well-nourished, and in no distress. No distress.  HENT:  Head: Normocephalic and atraumatic.  Mouth/Throat: Oropharynx is clear and moist. No oropharyngeal exudate.  Eyes: Conjunctivae are normal. Right eye exhibits no discharge. Left eye exhibits no discharge. No scleral icterus.  Neck: Normal range of motion. Neck supple.  Cardiovascular: Normal rate, regular rhythm, normal heart sounds and intact distal pulses.   Pulmonary/Chest: Effort normal and breath sounds normal. No respiratory distress. No wheezes. No rales.  Abdominal: Soft. Bowel sounds are normal. Exhibits no distension and no mass. There is no tenderness.  Musculoskeletal: Normal range of motion. Exhibits no edema.  Lymphadenopathy:    No cervical adenopathy.  Neurological: Alert and oriented to person, place, and time. Exhibits normal muscle tone. Gait normal. Coordination normal.  Skin: Skin is warm and dry. No rash noted. Not diaphoretic. No erythema. No pallor.  Psychiatric: Mood, memory and judgment normal.  Vitals reviewed.  LABORATORY DATA: Lab Results  Component Value Date   WBC 1.9 (L) 03/16/2019   HGB 11.6 (L) 03/16/2019   HCT 34.4 (L) 03/16/2019   MCV 94.2 03/16/2019   PLT 176 03/16/2019      Chemistry  Component Value Date/Time   NA 136 02/23/2019 1051   NA 134 (A) 07/22/2018   K 4.1 02/23/2019 1051   CL 105 02/23/2019 1051   CO2 25 02/23/2019 1051   BUN 19 02/23/2019 1051   BUN 24 (A) 07/22/2018   CREATININE 1.07 (H) 02/23/2019 1051   CREATININE 0.57 12/26/2014 1423   GLU 92 07/22/2018      Component Value Date/Time   CALCIUM 9.0 02/23/2019 1051   ALKPHOS 87 02/23/2019 1051   AST 24 02/23/2019 1051   ALT 20 02/23/2019 1051   BILITOT 0.5 02/23/2019 1051       RADIOGRAPHIC STUDIES:  No results found.   ASSESSMENT/PLAN:  This is a very  pleasant 68 year old Caucasian female who presented for evaluation of leukocytopenia.  Of note, her father had myelodysplastic syndrome.  The patient was seen with Dr. Julien Nordmann today.  Dr. Julien Nordmann reviewed her lab studies which included vitamin Y48 level, folic acid, methylmalonic acid, iron study, ferritin, hepatitis panel, HIV, rheumatoid factor and ANA. Her lab studies were unremarkable.  Her repeat CBC from today showed persistent leukocytopenia with a white blood cell count of 1.9, ANC of 1.1, and absolute lymphocytes at 0.4.  Her hemoglobin is 11.6 today.   Dr. Julien Nordmann recommends that the patient undergo a bone marrow biopsy and aspirate to further evaluate her condition and to rule out any underlying bone marrow pathology.   We will arrange for the patient to have a bone marrow biopsy performed in the next 10 to 14 days.  We will see the patient back a few days after her procedure to discuss her results.   The patient was advised to call immediately if she has any concerning symptoms in the interval. The patient voices understanding of current disease status and treatment options and is in agreement with the current care plan. All questions were answered. The patient knows to call the clinic with any problems, questions or concerns. We can certainly see the patient much sooner if necessary   Orders Placed This Encounter  Procedures  . CBC with Differential (Cancer Center Only)    Standing Status:   Future    Standing Expiration Date:   03/15/2020     Tobe Sos Mary Tullis, PA-C 03/16/19  ADDENDUM: Hematology/Oncology Attending: I had a face-to-face encounter with the patient today.  I recommended her care plan.  This is a very pleasant 68 years old white female presented for evaluation of persistent leukocytopenia. The patient underwent several studies recently for evaluation of her leukocytopenia and these were unrevealing. CBC today showed persistent leukocytopenia. I  recommended for the patient to have a bone marrow biopsy and aspirate for further evaluation of her condition and to rule out any underlying myelodysplastic syndrome or other abnormality. I will see her back for follow-up visit in 2 weeks for evaluation for discussion of the biopsy results and further recommendation regarding her condition. The patient was advised to call immediately if she has any concerning symptoms in the interval.  Disclaimer: This note was dictated with voice recognition software. Similar sounding words can inadvertently be transcribed and may be missed upon review. Mary Kempf, MD 03/16/19

## 2019-03-16 NOTE — Telephone Encounter (Signed)
I talk with patient regarding schedule  

## 2019-03-19 ENCOUNTER — Telehealth: Payer: Self-pay | Admitting: Physician Assistant

## 2019-03-19 NOTE — Telephone Encounter (Signed)
I left a message regarding 11/6

## 2019-03-21 ENCOUNTER — Telehealth: Payer: Self-pay

## 2019-03-21 NOTE — Telephone Encounter (Signed)
Patient to have BMBX on 11/3.  Scheduled lab with Butch Penny in cytometry.  LCC/NP schedule blocked and schedule message sent.  Patient aware of date/time.

## 2019-03-27 ENCOUNTER — Inpatient Hospital Stay: Payer: Medicare Other

## 2019-03-27 ENCOUNTER — Inpatient Hospital Stay: Payer: Medicare Other | Attending: Internal Medicine | Admitting: Adult Health

## 2019-03-27 ENCOUNTER — Other Ambulatory Visit: Payer: Self-pay

## 2019-03-27 VITALS — BP 135/70 | HR 76 | Temp 98.9°F | Resp 16

## 2019-03-27 DIAGNOSIS — Z880 Allergy status to penicillin: Secondary | ICD-10-CM | POA: Insufficient documentation

## 2019-03-27 DIAGNOSIS — D704 Cyclic neutropenia: Secondary | ICD-10-CM | POA: Diagnosis not present

## 2019-03-27 DIAGNOSIS — Z888 Allergy status to other drugs, medicaments and biological substances status: Secondary | ICD-10-CM | POA: Diagnosis not present

## 2019-03-27 DIAGNOSIS — Z87442 Personal history of urinary calculi: Secondary | ICD-10-CM | POA: Insufficient documentation

## 2019-03-27 DIAGNOSIS — Z8719 Personal history of other diseases of the digestive system: Secondary | ICD-10-CM | POA: Diagnosis not present

## 2019-03-27 DIAGNOSIS — Z79899 Other long term (current) drug therapy: Secondary | ICD-10-CM | POA: Insufficient documentation

## 2019-03-27 DIAGNOSIS — R891 Abnormal level of hormones in specimens from other organs, systems and tissues: Secondary | ICD-10-CM | POA: Diagnosis not present

## 2019-03-27 DIAGNOSIS — R5383 Other fatigue: Secondary | ICD-10-CM | POA: Diagnosis not present

## 2019-03-27 DIAGNOSIS — D72819 Decreased white blood cell count, unspecified: Secondary | ICD-10-CM

## 2019-03-27 DIAGNOSIS — D72818 Other decreased white blood cell count: Secondary | ICD-10-CM | POA: Insufficient documentation

## 2019-03-27 DIAGNOSIS — Z832 Family history of diseases of the blood and blood-forming organs and certain disorders involving the immune mechanism: Secondary | ICD-10-CM | POA: Insufficient documentation

## 2019-03-27 DIAGNOSIS — Z881 Allergy status to other antibiotic agents status: Secondary | ICD-10-CM | POA: Diagnosis not present

## 2019-03-27 DIAGNOSIS — Z882 Allergy status to sulfonamides status: Secondary | ICD-10-CM | POA: Diagnosis not present

## 2019-03-27 DIAGNOSIS — K900 Celiac disease: Secondary | ICD-10-CM | POA: Insufficient documentation

## 2019-03-27 LAB — CBC WITH DIFFERENTIAL (CANCER CENTER ONLY)
Abs Immature Granulocytes: 0 10*3/uL (ref 0.00–0.07)
Basophils Absolute: 0 10*3/uL (ref 0.0–0.1)
Basophils Relative: 0 %
Eosinophils Absolute: 0.1 10*3/uL (ref 0.0–0.5)
Eosinophils Relative: 3 %
HCT: 36.1 % (ref 36.0–46.0)
Hemoglobin: 12.2 g/dL (ref 12.0–15.0)
Immature Granulocytes: 0 %
Lymphocytes Relative: 17 %
Lymphs Abs: 0.3 10*3/uL — ABNORMAL LOW (ref 0.7–4.0)
MCH: 31.9 pg (ref 26.0–34.0)
MCHC: 33.8 g/dL (ref 30.0–36.0)
MCV: 94.3 fL (ref 80.0–100.0)
Monocytes Absolute: 0.3 10*3/uL (ref 0.1–1.0)
Monocytes Relative: 14 %
Neutro Abs: 1.3 10*3/uL — ABNORMAL LOW (ref 1.7–7.7)
Neutrophils Relative %: 66 %
Platelet Count: 186 10*3/uL (ref 150–400)
RBC: 3.83 MIL/uL — ABNORMAL LOW (ref 3.87–5.11)
RDW: 12.6 % (ref 11.5–15.5)
WBC Count: 2 10*3/uL — ABNORMAL LOW (ref 4.0–10.5)
nRBC: 0 % (ref 0.0–0.2)

## 2019-03-27 MED ORDER — LIDOCAINE HCL 2 % IJ SOLN
INTRAMUSCULAR | Status: AC
  Filled 2019-03-27: qty 20

## 2019-03-27 NOTE — Patient Instructions (Signed)

## 2019-03-27 NOTE — Progress Notes (Signed)
INDICATION: leukopenia   Bone Marrow Biopsy and Aspiration Procedure Note   Informed consent was obtained and potential risks including bleeding, infection and pain were reviewed with the patient.  The patient's name, date of birth, identification, consent and allergies were verified prior to the start of procedure and time out was performed.  The left posterior iliac crest was chosen as the site of biopsy.  The skin was prepped with ChloraPrep.   8 cc of 2% lidocaine was used to provide local anaesthesia.   8 cc of bone marrow aspirate was obtained followed by 1cm biopsy.  Pressure was applied to the biopsy site and bandage was placed over the biopsy site. Patient was made to lie on the back for 30 mins prior to discharge.  The procedure was tolerated well. COMPLICATIONS: None BLOOD LOSS: none The patient was discharged home in stable condition with a 1 week follow up to review results.  Patient was provided with post bone marrow biopsy instructions and instructed to call if there was any bleeding or worsening pain.  Specimens sent for flow cytometry, cytogenetics and additional studies.  Signed Scot Dock, NP

## 2019-03-28 LAB — SURGICAL PATHOLOGY

## 2019-03-30 ENCOUNTER — Inpatient Hospital Stay: Payer: Medicare Other

## 2019-03-30 ENCOUNTER — Encounter: Payer: Self-pay | Admitting: Physician Assistant

## 2019-03-30 ENCOUNTER — Inpatient Hospital Stay (HOSPITAL_BASED_OUTPATIENT_CLINIC_OR_DEPARTMENT_OTHER): Payer: Medicare Other | Admitting: Physician Assistant

## 2019-03-30 ENCOUNTER — Other Ambulatory Visit: Payer: Self-pay

## 2019-03-30 VITALS — BP 128/57 | HR 70 | Temp 98.0°F | Resp 17 | Ht 64.0 in | Wt 136.1 lb

## 2019-03-30 DIAGNOSIS — D709 Neutropenia, unspecified: Secondary | ICD-10-CM | POA: Diagnosis not present

## 2019-03-30 DIAGNOSIS — D72818 Other decreased white blood cell count: Secondary | ICD-10-CM | POA: Diagnosis not present

## 2019-03-30 DIAGNOSIS — N950 Postmenopausal bleeding: Secondary | ICD-10-CM | POA: Diagnosis not present

## 2019-03-30 NOTE — Progress Notes (Signed)
[place] OFFICE PROGRESS NOTE  Pendergraft, Darrick Penna, MD No address on file  DIAGNOSIS: Leukopenia of unclear etiology. Diagnosed in October 2020.   PRIOR THERAPY: None  CURRENT THERAPY: None  INTERVAL HISTORY: Mary Leach 68 y.o. female returns to the clinic for a follow-up visit.  The patient is feeling well today without any concerning complaints except for mild fatigue. She denies any fever, chills, night sweats, lymphadenopathy, or weight loss.  She denies any bleeding or bruising including epistaxis, gingival bleeding, melena, or hematochezia.  She denies any signs and symptoms of infection including nasal congestion, sore throat, cough, shortness of breath, abdominal pain, diarrhea, dysuria, or skin infections.  She does have celiac disease for which she was diagnosed in 2013.  She is not taking any medications for this and it is managed by lifestyle/dietary modifications.  She has not been on any new medications recently.  She was recently referred to the clinic for evaluation of leukocytopenia/neutropenia.  She had several lab studies performed including a folic acid, vitamin J24, iron panel, ferritin, HIV antibody testing, methylmalonic acid, hepatitis panel, rheumatoid factor, and ANA, all of which were unremarkable.  Her father had a history of myelodysplastic syndrome.  The patient recently had a bone marrow biopsy performed.  She is here today for evaluation and to review her bone marrow biopsy results.   MEDICAL HISTORY: Past Medical History:  Diagnosis Date  . Allergic rhinitis   . Anxiety   . Asthma   . Bipolar disorder ([place])   . Celiac disease   . Depression   . Elevated liver function tests 2019  . Elevated testosterone level 2019  . Elevated vitamin B12 level 2019  . History of colonic polyps   . Hypothyroidism   . Ileus following gastrointestinal surgery ([place]) 10/18/2011  . Leukopenia   . Low TSH level 2019  . Malnutrition following  gastrointestinal surgery 10/18/2011  . Osteoporosis 2018  . RBBB (right bundle branch block)   . Renal stone 2020    ALLERGIES:  is allergic to augmentin [amoxicillin-pot clavulanate]; chocolate; other; prevnar [pneumococcal 13-val conj vacc]; sulfonamide derivatives; and tape.  MEDICATIONS:  Current Outpatient Medications  Medication Sig Dispense Refill  . ADVAIR DISKUS 100-50 MCG/DOSE AEPB Inhale 1 puff into the lungs 2 (two) times daily.     Mary Leach THYROID 120 MG tablet Take 1 tablet by mouth daily.    Marland Kitchen azelastine (OPTIVAR) 0.05 % ophthalmic solution as needed.    . Azelastine-Fluticasone (DYMISTA) 137-50 MCG/ACT SUSP Place 2 sprays into the nose daily.     . cetirizine (ZYRTEC) 10 MG tablet Take 10 mg by mouth every evening.     Marland Kitchen FLUoxetine (PROZAC) 10 MG capsule Take 3 capsules (30 mg total) by mouth daily. Takes 3 tablets in AM 270 capsule 3  . liothyronine (CYTOMEL) 5 MCG tablet     . magnesium gluconate (MAGONATE) 500 MG tablet Take 1,500 mg by mouth every evening.     . Multiple Minerals-Vitamins (NUTRA-SUPPORT BONE) CAPS Take 1-2 tablets by mouth See admin instructions. Take 1 tablet every morning and 2 tablets every night    . Probiotic Product (PROBIOTIC DAILY PO) Take by mouth. metaflora IB    . vitamin C (ASCORBIC ACID) 500 MG tablet Take 500 mg by mouth daily.    Marland Kitchen EPINEPHrine (EPI-PEN) 0.3 mg/0.3 mL DEVI Inject 0.3 mg into the muscle once. For previous allergies, maybe gluten     No current facility-administered medications for this visit.  SURGICAL HISTORY:  Past Surgical History:  Procedure Laterality Date  . COLON SURGERY    . COLONOSCOPY W/ POLYPECTOMY    . COLOSTOMY TAKEDOWN  05/29/2012   Procedure: LAPAROSCOPIC COLOSTOMY TAKEDOWN;  Surgeon: Odis Hollingshead, MD;  Location: [facility];  Service: General;  Laterality: N/A;  Laparoscopic Assisted Colostomy Closure  with proctoscopy  . CYSTECTOMY     LEFT FOOT, RIGHT HAND  . DILATATION & CURETTAGE/HYSTEROSCOPY  WITH MYOSURE N/A 07/25/2018   Procedure: DILATATION & CURETTAGE/HYSTEROSCOPY WITH MYOSURE RESECTION OF ENDOMETRIAL AND ENDOCERVICAL POLYP;  Surgeon: Nunzio Cobbs, MD;  Location[facility];  Service: Gynecology;  Laterality: N/A;  . FOOT SURGERY    . FOOT SURGERY Left 12/07/2018   cyst removed  . LAPAROTOMY  10/07/2011   Procedure: EXPLORATORY LAPAROTOMY;  Surgeon: Odis Hollingshead, MD;  Location: [facility];  Service: General;  Laterality: N/A;  hartmann procedure and creation of colostomy  . NASAL SEPTOPLASTY W/ TURBINOPLASTY Bilateral 11/11/2015   Procedure: SEPTOPLASTY BILATERAL TURBINATE  REDUCTION ;  Surgeon: Leta Baptist, MD;  Location: [place];  Service: ENT;  Laterality: Bilateral;  . NASAL SINUS SURGERY Bilateral 11/11/2015   Procedure: BILATERAL ENDOSCOPIC  MAXILLARY ANTROSTOMY;  Surgeon: Leta Baptist, MD;  Location: [place];  Service: ENT;  Laterality: Bilateral;  . TONSILLECTOMY AND ADENOIDECTOMY    . TUBAL LIGATION    . UPPER GASTROINTESTINAL ENDOSCOPY  01/19/12    REVIEW OF SYSTEMS:   Review of Systems  Constitutional: Positive for mild fatigue. Negative for appetite change, chills, fever and unexpected weight change.  HENT:  Negative for mouth sores, nosebleeds, sore throat and trouble swallowing.   Eyes: Negative for eye problems and icterus.  Respiratory: Negative for cough, hemoptysis, shortness of breath and wheezing.   Cardiovascular: Negative for chest pain and leg swelling.  Gastrointestinal: Negative for abdominal pain, constipation, diarrhea, nausea and vomiting.  Genitourinary: Negative for bladder incontinence, difficulty urinating, dysuria, frequency and hematuria.   Musculoskeletal: Negative for back pain, gait problem, neck pain and neck stiffness.  Skin: Negative for itching and rash.  Neurological: Negative for dizziness, extremity weakness, gait problem, headaches, light-headedness and seizures.   Hematological: Negative for adenopathy. Does not bruise/bleed easily.  Psychiatric/Behavioral: Negative for confusion, depression and sleep disturbance. The patient is not nervous/anxious.     PHYSICAL EXAMINATION:  Blood pressure (!) 128/57, pulse 70, temperature 98 F (36.7 C), temperature source Temporal, resp. rate 17, height _0  (1.626 m), weight 136 lb 1.6 oz (61.7 kg), last menstrual period 05/24/2006, SpO2 100 %.  ECOG PERFORMANCE STATUS: 0 - Asymptomatic  Physical Exam  Constitutional: Oriented to person, place, and time and well-developed, well-nourished, and in no distress.  HENT:  Head: Normocephalic and atraumatic.  Mouth/Throat: Oropharynx is clear and moist. No oropharyngeal exudate.  Eyes: Conjunctivae are normal. Right eye exhibits no discharge. Left eye exhibits no discharge. No scleral icterus.  Neck: Normal range of motion. Neck supple.  Cardiovascular: Normal rate, regular rhythm, normal heart sounds and intact distal pulses.   Pulmonary/Chest: Effort normal and breath sounds normal. No respiratory distress. No wheezes. No rales.  Abdominal: Soft. Bowel sounds are normal. Exhibits no distension and no mass. There is no tenderness.  Musculoskeletal: Normal range of motion. Exhibits no edema.  Lymphadenopathy:    No cervical adenopathy.  Neurological: Alert and oriented to person, place, and time. Exhibits normal muscle tone. Gait normal. Coordination normal.  Skin: Skin is warm and dry. No  rash noted. Not diaphoretic. No erythema. No pallor.  Psychiatric: Mood, memory and judgment normal.  Vitals reviewed.  LABORATORY DATA: Lab Results  Component Value Date   WBC 2.0 (L) 03/27/2019   HGB 12.2 03/27/2019   HCT 36.1 03/27/2019   MCV 94.3 03/27/2019   PLT 186 03/27/2019      Chemistry      Component Value Date/Time   NA 136 02/23/2019 1051   NA 134 (A) 07/22/2018   K 4.1 02/23/2019 1051   CL 105 02/23/2019 1051   CO2 25 02/23/2019 1051   BUN 19  02/23/2019 1051   BUN 24 (A) 07/22/2018   CREATININE 1.07 (H) 02/23/2019 1051   CREATININE 0.57 12/26/2014 1423   GLU 92 07/22/2018      Component Value Date/Time   CALCIUM 9.0 02/23/2019 1051   ALKPHOS 87 02/23/2019 1051   AST 24 02/23/2019 1051   ALT 20 02/23/2019 1051   BILITOT 0.5 02/23/2019 1051       RADIOGRAPHIC STUDIES:  No results found.   ASSESSMENT/PLAN: This is a very pleasant 68 year old Caucasian female who presented for evaluation for leukocytopenia.   The patient was seen with Dr. Julien Nordmann today.  The patient recently had a bone marrow biopsy and aspirate performed.  Dr. Julien Nordmann discussed the results with the patient today.  Patient's bone marrow biopsy and flow cytometry were unremarkable.  There is no evidence of any underlying myelodysplastic syndrome or any other bone marrow abnormality  Dr. Julien Nordmann recommends that the patient continue on observation with repeat CBC, CMP and LDH in 6 months.  We will see the patient back at that time for evaluation and to review her repeat labs.  The patient was advised to call immediately if she has any concerning symptoms in the interval. The patient voices understanding of current disease status and treatment options and is in agreement with the current care plan. All questions were answered. The patient knows to call the clinic with any problems, questions or concerns. We can certainly see the patient much sooner if necessary   Orders Placed This Encounter  Procedures  . CBC with Differential (Cancer Center Only)    Standing Status:   Future    Standing Expiration Date:   03/29/2020  . CMP ([facility] only)    Standing Status:   Future    Standing Expiration Date:   03/29/2020  . Lactate dehydrogenase (LDH)    Standing Status:   Future    Standing Expiration Date:   03/29/2020     Tobe Sos Dulcemaria Bula, PA-C 03/30/19  ADDENDUM: Hematology/Oncology Attending: I had a face-to-face encounter with the  patient.  I recommended her care plan.  This is a very pleasant 69 years old white female presented for evaluation of leukocytopenia of unclear etiology.  The patient underwent several studies including iron study, ferritin, vitamin M84 level, folic acid, ANA, hepatitis panel, HIV as well as rheumatoid factor.  She also had a bone marrow biopsy and aspirate that showed no concerning findings. Repeat CBC today showed persistent leukocytopenia but the patient is currently asymptomatic.  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 I recommended for the patient to continue on observation with routine follow-up visit by her primary care physician in addition to follow-up visit with [place] in 6 months with repeat CBC, comprehensive metabolic panel and LDH. The patient was also advised to call immediately if she has any concerning symptoms in the interval.  Disclaimer:  This note was dictated with voice recognition software. Similar sounding words can inadvertently be transcribed and may be missed upon review. Eilleen Kempf, MD 03/30/19

## 2019-04-03 ENCOUNTER — Other Ambulatory Visit: Payer: Self-pay

## 2019-04-03 ENCOUNTER — Ambulatory Visit
Admission: RE | Admit: 2019-04-03 | Discharge: 2019-04-03 | Disposition: A | Discharge status: Home / Self Care | Payer: Medicare Other | Source: Ambulatory Visit | Attending: Obstetrics and Gynecology | Admitting: Obstetrics and Gynecology

## 2019-04-03 ENCOUNTER — Encounter (HOSPITAL_COMMUNITY): Payer: Self-pay | Admitting: Internal Medicine

## 2019-04-03 DIAGNOSIS — M810 Age-related osteoporosis without current pathological fracture: Secondary | ICD-10-CM

## 2019-04-03 DIAGNOSIS — Z1231 Encounter for screening mammogram for malignant neoplasm of breast: Secondary | ICD-10-CM

## 2019-04-04 ENCOUNTER — Encounter (HOSPITAL_COMMUNITY): Payer: Self-pay | Admitting: Internal Medicine

## 2019-05-29 ENCOUNTER — Other Ambulatory Visit: Payer: Self-pay

## 2019-05-30 ENCOUNTER — Ambulatory Visit (INDEPENDENT_AMBULATORY_CARE_PROVIDER_SITE_OTHER): Payer: Medicare Other | Admitting: Family Medicine

## 2019-05-30 DIAGNOSIS — Z0000 Encounter for general adult medical examination without abnormal findings: Secondary | ICD-10-CM

## 2019-05-30 NOTE — Progress Notes (Signed)
Upon rooming patient for her visit today for [facility], patient stated that she was looking for an integrative medicine doctor and I advised patient that Dr. Rogers Blocker was not.  Patient expressed that she felt that she would prefer not to see Dr. Rogers Blocker and I told her that I completely understood and wished her well.  She left w/o being seen but was appreciative of the fact that I explained Dr. Shelby Mattocks stance on integrative medicine.

## 2019-05-31 NOTE — Progress Notes (Signed)
Did not see patient. She was looking for an integrative medicine doctor which I am not. She would like to go elsewhere and I will no charge her for today.  Orma Flaming, MD [place]

## 2019-06-01 ENCOUNTER — Ambulatory Visit: Payer: Medicare Other | Admitting: Physician Assistant

## 2019-06-01 ENCOUNTER — Other Ambulatory Visit: Payer: Medicare Other

## 2019-06-04 HISTORY — PX: SKIN BIOPSY: SHX1

## 2019-06-07 ENCOUNTER — Encounter: Payer: Self-pay | Admitting: Family Medicine

## 2019-06-07 ENCOUNTER — Telehealth: Payer: Self-pay | Admitting: Obstetrics and Gynecology

## 2019-06-07 NOTE — Telephone Encounter (Signed)
Spoke with patient. Patient reports scant amount of vaginal bleeding this morning. Denies any other symptoms. Hx of D&C 07/2018. [facility] 08/2018. No new medications or hormones. Advised patient further evaluation is recommended. Will review with Dr. Quincy Simmonds and return call with recommendations and assist with scheduling. Patient agreeable.   Dr. Quincy Simmonds -please advise. Will patient need PUS or EMB?

## 2019-06-07 NOTE — Telephone Encounter (Signed)
Patient is experiencing post-menopausal bleeding. No other symptoms.

## 2019-06-08 ENCOUNTER — Other Ambulatory Visit: Payer: Self-pay | Admitting: *Deleted

## 2019-06-08 DIAGNOSIS — N950 Postmenopausal bleeding: Secondary | ICD-10-CM

## 2019-06-08 NOTE — Telephone Encounter (Signed)
Left message to call Novia Lansberry, RN at [facility] [phone].   

## 2019-06-08 NOTE — Telephone Encounter (Signed)
Spoke with patient, advised as seen below per Dr. Quincy Simmonds. Patient agreeable to proceed with PUS and EMB. PUS and EMB scheduled for 1/21 at 10am, consult with Dr. Quincy Simmonds at 10:30am. Orders placed for precert. Advised patient to take Motrin 800 mg with food and water one hour before procedure. Patient verbalizes understanding and is agreeable.   Routing to provider for final review. Patient is agreeable to disposition. Will close encounter.  Cc: Lerry Liner, Magdalene Patricia

## 2019-06-08 NOTE — Telephone Encounter (Signed)
I recommend appointment for pelvic US and EMB with me.

## 2019-06-08 NOTE — Telephone Encounter (Signed)
Patient returned call

## 2019-06-14 ENCOUNTER — Ambulatory Visit (INDEPENDENT_AMBULATORY_CARE_PROVIDER_SITE_OTHER): Payer: Medicare Other

## 2019-06-14 ENCOUNTER — Ambulatory Visit: Payer: Medicare Other | Admitting: Obstetrics and Gynecology

## 2019-06-14 ENCOUNTER — Other Ambulatory Visit: Payer: Self-pay

## 2019-06-14 ENCOUNTER — Encounter: Payer: Self-pay | Admitting: Obstetrics and Gynecology

## 2019-06-14 VITALS — BP 126/80 | HR 70 | Temp 96.8°F | Ht 63.75 in | Wt 138.4 lb

## 2019-06-14 DIAGNOSIS — N950 Postmenopausal bleeding: Secondary | ICD-10-CM | POA: Diagnosis not present

## 2019-06-14 NOTE — Progress Notes (Signed)
GYNECOLOGY  VISIT   HPI: 69 y.o.   Married  Caucasian  female   G2P2 with Patient's last menstrual period was 05/24/2006.   here for pelvic ultrasound for recurrent postmenopausal bleeding.   She had one day of bleeding with wiping after a shower.  No pain.  Patient has a hx of prior benign endometrial polyp.  GYNECOLOGIC HISTORY: Patient's last menstrual period was 05/24/2006. Contraception: Tubal Menopausal hormone therapy:  none Last mammogram: 04-04-19 3D/Neg/density B/BiRads1 Last pap smear: 01-12-18  Neg, 12-26-14 Neg:Neg HR HPV        OB History    Gravida  2   Para  2   Term      Preterm      AB      Living  2     SAB      TAB      Ectopic      Multiple      Live Births                 Patient Active Problem List   Diagnosis Date Noted  . Leukopenia 02/23/2019  . Adult acne 07/20/2018  . Postmenopausal bleeding 05/15/2018  . High risk medication use 04/03/2018  . Chronic maxillary sinusitis 04/03/2018  . Elevated testosterone level in female 04/03/2018  . Allergic conjunctivitis and rhinitis, bilateral 04/03/2018  . Celiac sprue 07/02/2015  . Allergic rhinitis due to pollen 12/18/2014  . H/O resection of large bowel 12/15/2013  . 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 Asthma 10/18/2011  . Rectal Perforation due to fecal impaction s/p Hartmann procedure 5/16 10/08/2011    Past Medical History:  Diagnosis Date  . Allergic rhinitis   . Anxiety   . Asthma   . Bipolar disorder ([place])   . Celiac disease   . Depression   . Elevated liver function tests 2019  . Elevated testosterone level 2019  . Elevated vitamin B12 level 2019  . History of colonic polyps   . Hypothyroidism   . Ileus following gastrointestinal surgery ([place]) 10/18/2011  . Leukopenia   . Low TSH level 2019  . Malnutrition following gastrointestinal surgery 10/18/2011  . Osteoporosis 2018  . RBBB (right bundle branch block)   . Renal stone 2020     Past Surgical History:  Procedure Laterality Date  . COLON SURGERY    . COLONOSCOPY W/ POLYPECTOMY    . COLOSTOMY TAKEDOWN  05/29/2012   Procedure: LAPAROSCOPIC COLOSTOMY TAKEDOWN;  Surgeon: Odis Hollingshead, MD;  Location: [facility];  Service: General;  Laterality: N/A;  Laparoscopic Assisted Colostomy Closure  with proctoscopy  . CYSTECTOMY     LEFT FOOT, RIGHT HAND  . DILATATION & CURETTAGE/HYSTEROSCOPY WITH MYOSURE N/A 07/25/2018   Procedure: DILATATION & CURETTAGE/HYSTEROSCOPY WITH MYOSURE RESECTION OF ENDOMETRIAL AND ENDOCERVICAL POLYP;  Surgeon: Nunzio Cobbs, MD;  Location: [facility];  Service: Gynecology;  Laterality: N/A;  . FOOT SURGERY    . FOOT SURGERY Left 12/07/2018   cyst removed  . LAPAROTOMY  10/07/2011   Procedure: EXPLORATORY LAPAROTOMY;  Surgeon: Odis Hollingshead, MD;  Location: [facility];  Service: General;  Laterality: N/A;  hartmann procedure and creation of colostomy  . NASAL SEPTOPLASTY W/ TURBINOPLASTY Bilateral 11/11/2015   Procedure: SEPTOPLASTY BILATERAL TURBINATE  REDUCTION ;  Surgeon: Leta Baptist, MD;  Location: [place];  Service: ENT;  Laterality: Bilateral;  . NASAL SINUS SURGERY Bilateral 11/11/2015   Procedure: BILATERAL ENDOSCOPIC  MAXILLARY ANTROSTOMY;  Surgeon: Leta Baptist, MD;  Location: [place];  Service: ENT;  Laterality: Bilateral;  . SKIN BIOPSY Right 06/04/2019   basal cell carcinoma , nodular pattern  . TONSILLECTOMY AND ADENOIDECTOMY    . TUBAL LIGATION    . UPPER GASTROINTESTINAL ENDOSCOPY  01/19/12    Current Outpatient Medications  Medication Sig Dispense Refill  . ADVAIR DISKUS 100-50 MCG/DOSE AEPB Inhale 1 puff into the lungs 2 (two) times daily.     Francia Greaves THYROID 120 MG tablet Take 1 tablet by mouth daily.    Marland Kitchen azelastine (OPTIVAR) 0.05 % ophthalmic solution as needed.    . Azelastine-Fluticasone (DYMISTA) 137-50 MCG/ACT SUSP Place 2 sprays into the nose daily.     . cetirizine  (ZYRTEC) 10 MG tablet Take 10 mg by mouth every evening.     Marland Kitchen EPINEPHrine (EPI-PEN) 0.3 mg/0.3 mL DEVI Inject 0.3 mg into the muscle once. For previous allergies, maybe gluten    . FLUoxetine (PROZAC) 10 MG capsule Take 3 capsules (30 mg total) by mouth daily. Takes 3 tablets in AM 270 capsule 3  . magnesium gluconate (MAGONATE) 500 MG tablet Take 1,500 mg by mouth every evening.     . Multiple Minerals-Vitamins (NUTRA-SUPPORT BONE) CAPS Take 1-2 tablets by mouth See admin instructions. Take 1 tablet every morning and 2 tablets every night    . Probiotic Product (PROBIOTIC DAILY PO) Take by mouth. metaflora IB    . vitamin C (ASCORBIC ACID) 500 MG tablet Take 500 mg by mouth daily.     No current facility-administered medications for this visit.     ALLERGIES: Augmentin [amoxicillin-pot clavulanate], Chocolate, Other, Prevnar [pneumococcal 13-val conj vacc], Sulfonamide derivatives, and Tape  Family History  Problem Relation Age of Onset  . Heart disease Father   . Osteoporosis Father   . Osteoporosis Mother   . Mental illness Paternal Aunt   . Skin cancer Maternal Grandmother   . Hypertension Maternal Grandfather   . Heart disease Paternal Grandfather   . Celiac disease Grandchild     Social History   Socioeconomic History  . Marital status: Married    Spouse name: Not on file  . Number of children: Not on file  . Years of education: Not on file  . Highest education level: Not on file  Occupational History    Employer: [employer]  Tobacco Use  . Smoking status: Never Smoker  . Smokeless tobacco: Never Used  Substance and Sexual Activity  . Alcohol use: Yes    Alcohol/week: 4.0 - 5.0 standard drinks    Types: 4 - 5 Standard drinks or equivalent per week  . Drug use: No  . Sexual activity: Not Currently    Partners: Male    Birth control/protection: Post-menopausal  Other Topics Concern  . Not on file  Social History Narrative   Married, lives with husband and  son Jenny Reichmann.  [employer] ([employer]).  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   Social Determinants of Health   Financial Resource Strain:   . Difficulty of Paying Living Expenses: Not on file  Food Insecurity:   . Worried About Charity fundraiser in the Last Year: Not on file  . Ran Out of Food in the Last Year: Not on file  Transportation Needs:   . Lack of Transportation (Medical): Not on file  . Lack of Transportation (Non-Medical): Not on file  Physical Activity:   . Days of Exercise per Week: Not on  file  . Minutes of Exercise per Session: Not on file  Stress:   . Feeling of Stress : Not on file  Social Connections:   . Frequency of Communication with Friends and Family: Not on file  . Frequency of Social Gatherings with Friends and Family: Not on file  . Attends Religious Services: Not on file  . Active Member of Clubs or Organizations: Not on file  . Attends Archivist Meetings: Not on file  . Marital Status: Not on file  Intimate Partner Violence:   . Fear of Current or Ex-Partner: Not on file  . Emotionally Abused: Not on file  . Physically Abused: Not on file  . Sexually Abused: Not on file    Review of Systems  All other systems reviewed and are negative.   PHYSICAL EXAMINATION:    BP 126/80   Pulse 70   Temp (!) 96.8 F (36 C) (Temporal)   Ht 5' 3.75" (1.619 m)   Wt 138 lb 6.4 oz (62.8 kg)   LMP 05/24/2006   BMI 23.94 kg/m     General appearance: alert, cooperative and appears stated age   Pelvic US  Uterus no masses.  EMS 1.78 mm. Ovaries atrophic.  No free fluid.  ASSESSMENT  Postmenopausal bleeding.  Current normal pelvic US.   Likely has atrophy.  Hx endometrial polyp, resected with Myosure.   PLAN  Phone call to patient to discuss the normal and reassuring ultrasound findings from 06/14/19, as I was not in the office that day. No EMB or sonohysterogram is needed.  I would recommend observational management.  She will call  for any future vaginal bleeding.

## 2019-06-20 ENCOUNTER — Telehealth: Payer: Self-pay | Admitting: Obstetrics and Gynecology

## 2019-06-20 NOTE — Telephone Encounter (Signed)
Patient cancelled her appointment Thursday for EMB. Patient said she spoke to Dr Quincy Simmonds and appointment is not needed.

## 2019-06-20 NOTE — Telephone Encounter (Signed)
Per review of OV notes dated 06/14/19, no EMB or SHGM needed, per Dr. Quincy Simmonds.   Routing to Dr. Quincy Simmonds.   Encounter closed.

## 2019-06-21 ENCOUNTER — Ambulatory Visit: Payer: Medicare Other | Admitting: Obstetrics and Gynecology

## 2019-09-27 ENCOUNTER — Ambulatory Visit: Payer: Medicare Other | Admitting: Internal Medicine

## 2019-09-27 ENCOUNTER — Other Ambulatory Visit: Payer: Medicare Other

## 2019-10-04 ENCOUNTER — Inpatient Hospital Stay: Payer: Medicare Other | Attending: Internal Medicine

## 2019-10-04 ENCOUNTER — Other Ambulatory Visit: Payer: Self-pay

## 2019-10-04 ENCOUNTER — Inpatient Hospital Stay: Payer: Medicare Other | Admitting: Internal Medicine

## 2019-10-04 ENCOUNTER — Encounter: Payer: Self-pay | Admitting: Internal Medicine

## 2019-10-04 ENCOUNTER — Other Ambulatory Visit: Payer: Self-pay | Admitting: Medical Oncology

## 2019-10-04 VITALS — BP 130/65 | HR 61 | Temp 98.3°F | Resp 18 | Ht 63.75 in | Wt 135.6 lb

## 2019-10-04 DIAGNOSIS — Z880 Allergy status to penicillin: Secondary | ICD-10-CM | POA: Insufficient documentation

## 2019-10-04 DIAGNOSIS — D72819 Decreased white blood cell count, unspecified: Secondary | ICD-10-CM | POA: Diagnosis present

## 2019-10-04 DIAGNOSIS — D708 Other neutropenia: Secondary | ICD-10-CM | POA: Diagnosis not present

## 2019-10-04 DIAGNOSIS — Z85828 Personal history of other malignant neoplasm of skin: Secondary | ICD-10-CM | POA: Insufficient documentation

## 2019-10-04 DIAGNOSIS — Z888 Allergy status to other drugs, medicaments and biological substances status: Secondary | ICD-10-CM | POA: Insufficient documentation

## 2019-10-04 DIAGNOSIS — Z79899 Other long term (current) drug therapy: Secondary | ICD-10-CM | POA: Diagnosis not present

## 2019-10-04 DIAGNOSIS — Z8719 Personal history of other diseases of the digestive system: Secondary | ICD-10-CM | POA: Insufficient documentation

## 2019-10-04 DIAGNOSIS — D709 Neutropenia, unspecified: Secondary | ICD-10-CM

## 2019-10-04 DIAGNOSIS — Z882 Allergy status to sulfonamides status: Secondary | ICD-10-CM | POA: Diagnosis not present

## 2019-10-04 DIAGNOSIS — Z881 Allergy status to other antibiotic agents status: Secondary | ICD-10-CM | POA: Diagnosis not present

## 2019-10-04 DIAGNOSIS — Z87442 Personal history of urinary calculi: Secondary | ICD-10-CM | POA: Diagnosis not present

## 2019-10-04 LAB — CMP (CANCER CENTER ONLY)
ALT: 19 U/L (ref 0–44)
AST: 26 U/L (ref 15–41)
Albumin: 3.8 g/dL (ref 3.5–5.0)
Alkaline Phosphatase: 105 U/L (ref 38–126)
Anion gap: 7 (ref 5–15)
BUN: 14 mg/dL (ref 8–23)
CO2: 26 mmol/L (ref 22–32)
Calcium: 8.8 mg/dL — ABNORMAL LOW (ref 8.9–10.3)
Chloride: 101 mmol/L (ref 98–111)
Creatinine: 0.71 mg/dL (ref 0.44–1.00)
GFR, Est AFR Am: >60 mL/min (ref >60)
GFR, Estimated: >60 mL/min (ref >60)
Glucose, Bld: 97 mg/dL (ref 70–99)
Potassium: 4.3 mmol/L (ref 3.5–5.1)
Sodium: 134 mmol/L — ABNORMAL LOW (ref 135–145)
Total Bilirubin: 0.6 mg/dL (ref 0.3–1.2)
Total Protein: 7.2 g/dL (ref 6.5–8.1)

## 2019-10-04 LAB — CBC WITH DIFFERENTIAL (CANCER CENTER ONLY)
Abs Immature Granulocytes: 0.01 10*3/uL (ref 0.00–0.07)
Basophils Absolute: 0 10*3/uL (ref 0.0–0.1)
Basophils Relative: 1 %
Eosinophils Absolute: 0.1 10*3/uL (ref 0.0–0.5)
Eosinophils Relative: 3 %
HCT: 37.2 % (ref 36.0–46.0)
Hemoglobin: 12.5 g/dL (ref 12.0–15.0)
Immature Granulocytes: 1 %
Lymphocytes Relative: 17 %
Lymphs Abs: 0.4 10*3/uL — ABNORMAL LOW (ref 0.7–4.0)
MCH: 31.6 pg (ref 26.0–34.0)
MCHC: 33.6 g/dL (ref 30.0–36.0)
MCV: 94.2 fL (ref 80.0–100.0)
Monocytes Absolute: 0.3 10*3/uL (ref 0.1–1.0)
Monocytes Relative: 14 %
Neutro Abs: 1.4 10*3/uL — ABNORMAL LOW (ref 1.7–7.7)
Neutrophils Relative %: 64 %
Platelet Count: 187 10*3/uL (ref 150–400)
RBC: 3.95 MIL/uL (ref 3.87–5.11)
RDW: 13.1 % (ref 11.5–15.5)
WBC Count: 2.1 10*3/uL — ABNORMAL LOW (ref 4.0–10.5)
nRBC: 0 % (ref 0.0–0.2)

## 2019-10-04 LAB — LACTATE DEHYDROGENASE: LDH: 179 U/L (ref 98–192)

## 2019-10-04 NOTE — Progress Notes (Signed)
[place] [phone]   [phone]  OFFICE PROGRESS NOTE  Pendergraft, Darrick Penna, MD No address on file  DIAGNOSIS: Leukocytopenia of unclear etiology diagnosed in October 2020.  PRIOR THERAPY: None  CURRENT THERAPY: Observation.  INTERVAL HISTORY: Mary Leach 69 y.o. female returns to the clinic today for returns to the clinic today for follow-up visit.  The patient is feeling fine today with no concerning complaints.  She was recently treated for fungal infection with fluconazole.  She denied having any current chest pain, shortness of breath, cough or hemoptysis.  She denied having any fever or chills.  She has no nausea, vomiting, diarrhea or constipation.  She denied having any headache or visual changes.  She is here today for evaluation and repeat CBC.  MEDICAL HISTORY: Past Medical History:  Diagnosis Date  . Allergic rhinitis   . Anxiety   . Asthma   . Bipolar disorder ([place])   . Celiac disease   . Depression   . Elevated liver function tests 2019  . Elevated testosterone level 2019  . Elevated vitamin B12 level 2019  . History of colonic polyps   . Hypothyroidism   . Ileus following gastrointestinal surgery ([place]) 10/18/2011  . Leukopenia   . Low TSH level 2019  . Malnutrition following gastrointestinal surgery 10/18/2011  . Osteoporosis 2018  . RBBB (right bundle branch block)   . Renal stone 2020    ALLERGIES:  is allergic to augmentin [amoxicillin-pot clavulanate]; chocolate; other; prevnar [pneumococcal 13-val conj vacc]; sulfonamide derivatives; and tape.  MEDICATIONS:  Current Outpatient Medications  Medication Sig Dispense Refill  . ADVAIR DISKUS 100-50 MCG/DOSE AEPB Inhale 1 puff into the lungs 2 (two) times daily.     Francia Greaves THYROID 120 MG tablet Take 1 tablet by mouth daily.    Marland Kitchen azelastine (OPTIVAR) 0.05 % ophthalmic solution as needed.    . Azelastine-Fluticasone (DYMISTA) 137-50 MCG/ACT SUSP Place 2 sprays  into the nose daily.     . cetirizine (ZYRTEC) 10 MG tablet Take 10 mg by mouth every evening.     Marland Kitchen EPINEPHrine (EPI-PEN) 0.3 mg/0.3 mL DEVI Inject 0.3 mg into the muscle once. For previous allergies, maybe gluten    . FLUoxetine (PROZAC) 10 MG capsule Take 3 capsules (30 mg total) by mouth daily. Takes 3 tablets in AM 270 capsule 3  . magnesium gluconate (MAGONATE) 500 MG tablet Take 1,500 mg by mouth every evening.     . Multiple Minerals-Vitamins (NUTRA-SUPPORT BONE) CAPS Take 1-2 tablets by mouth See admin instructions. Take 1 tablet every morning and 2 tablets every night    . Probiotic Product (PROBIOTIC DAILY PO) Take by mouth. metaflora IB    . vitamin C (ASCORBIC ACID) 500 MG tablet Take 500 mg by mouth daily.     No current facility-administered medications for this visit.    SURGICAL HISTORY:  Past Surgical History:  Procedure Laterality Date  . COLON SURGERY    . COLONOSCOPY W/ POLYPECTOMY    . COLOSTOMY TAKEDOWN  05/29/2012   Procedure: LAPAROSCOPIC COLOSTOMY TAKEDOWN;  Surgeon: Odis Hollingshead, MD;  [facility]: [facility];  Service: General;  Laterality: N/A;  Laparoscopic Assisted Colostomy Closure  with proctoscopy  . CYSTECTOMY     LEFT FOOT, RIGHT HAND  . DILATATION & CURETTAGE/HYSTEROSCOPY WITH MYOSURE N/A 07/25/2018   Procedure: DILATATION & CURETTAGE/HYSTEROSCOPY WITH MYOSURE RESECTION OF ENDOMETRIAL AND ENDOCERVICAL POLYP;  Surgeon: Nunzio Cobbs, MD;  Location:  [place];  Service: Gynecology;  Laterality: N/A;  . FOOT SURGERY    . FOOT SURGERY Left 12/07/2018   cyst removed  . LAPAROTOMY  10/07/2011   Procedure: EXPLORATORY LAPAROTOMY;  Surgeon: Odis Hollingshead, MD;  Location: [facility];  Service: General;  Laterality: N/A;  hartmann procedure and creation of colostomy  . NASAL SEPTOPLASTY W/ TURBINOPLASTY Bilateral 11/11/2015   Procedure: SEPTOPLASTY BILATERAL TURBINATE  REDUCTION ;  Surgeon: Leta Baptist, MD;  Location: [place];   Service: ENT;  Laterality: Bilateral;  . NASAL SINUS SURGERY Bilateral 11/11/2015   Procedure: BILATERAL ENDOSCOPIC  MAXILLARY ANTROSTOMY;  Surgeon: Leta Baptist, MD;  Location: [place];  Service: ENT;  Laterality: Bilateral;  . SKIN BIOPSY Right 06/04/2019   basal cell carcinoma , nodular pattern  . TONSILLECTOMY AND ADENOIDECTOMY    . TUBAL LIGATION    . UPPER GASTROINTESTINAL ENDOSCOPY  01/19/12    REVIEW OF SYSTEMS:  A comprehensive review of systems was negative.   PHYSICAL EXAMINATION: General appearance: alert, cooperative and no distress Head: Normocephalic, without obvious abnormality, atraumatic Neck: no adenopathy, no JVD, supple, symmetrical, trachea midline and thyroid not enlarged, symmetric, no tenderness/mass/nodules Lymph nodes: Cervical, supraclavicular, and axillary nodes normal. Resp: clear to auscultation bilaterally Back: symmetric, no curvature. ROM normal. No CVA tenderness. Cardio: regular rate and rhythm, S1, S2 normal, no murmur, click, rub or gallop GI: soft, non-tender; bowel sounds normal; no masses,  no organomegaly Extremities: extremities normal, atraumatic, no cyanosis or edema  ECOG PERFORMANCE STATUS: 1 - Symptomatic but completely ambulatory  Blood pressure 130/65, pulse 61, temperature 98.3 F (36.8 C), temperature source Temporal, resp. rate 18, height 5' 3.75" (1.619 m), weight 135 lb 9.6 oz (61.5 kg), last menstrual period 05/24/2006, SpO2 100 %.  LABORATORY DATA: Lab Results  Component Value Date   WBC 2.1 (L) 10/04/2019   HGB 12.5 10/04/2019   HCT 37.2 10/04/2019   MCV 94.2 10/04/2019   PLT 187 10/04/2019      Chemistry      Component Value Date/Time   NA 136 02/23/2019 1051   NA 134 (A) 07/22/2018 0000   K 4.1 02/23/2019 1051   CL 105 02/23/2019 1051   CO2 25 02/23/2019 1051   BUN 19 02/23/2019 1051   BUN 24 (A) 07/22/2018 0000   CREATININE 1.07 (H) 02/23/2019 1051   CREATININE 0.57 12/26/2014 1423   GLU 92  07/22/2018 0000      Component Value Date/Time   CALCIUM 9.0 02/23/2019 1051   ALKPHOS 87 02/23/2019 1051   AST 24 02/23/2019 1051   ALT 20 02/23/2019 1051   BILITOT 0.5 02/23/2019 1051       RADIOGRAPHIC STUDIES: No results found.  ASSESSMENT AND PLAN: This is a very pleasant 69 years old white female with persistent leukocytopenia of unclear etiology.  She is currently treated for fungal infection and her allergy test was positive for many fungal types.  This could be an underlying etiology for her condition. CBC today showed the persistent leukocytopenia with absolute neutrophil count of 1400.  The patient is asymptomatic. I recommended for her to continue on observation with repeat CBC in 6 months. She was advised to call immediately if she has any concerning symptoms in the interval especially any infectious issues or fever and chills. The patient voices understanding of current disease status and treatment options and is in agreement with the current care plan.  All questions were answered. The patient knows  to call the clinic with any problems, questions or concerns. We can certainly see the patient much sooner if necessary.  Disclaimer: This note was dictated with voice recognition software. Similar sounding words can inadvertently be transcribed and may not be corrected upon review.

## 2019-10-05 ENCOUNTER — Telehealth: Payer: Self-pay | Admitting: Internal Medicine

## 2019-10-05 NOTE — Telephone Encounter (Signed)
Scheduled per los. Called and left msg. Mailed printout  °

## 2019-10-24 DIAGNOSIS — E7210 Disorders of sulfur-bearing amino-acid metabolism, unspecified: Secondary | ICD-10-CM | POA: Insufficient documentation

## 2019-10-24 DIAGNOSIS — R5383 Other fatigue: Secondary | ICD-10-CM | POA: Insufficient documentation

## 2019-10-24 DIAGNOSIS — R739 Hyperglycemia, unspecified: Secondary | ICD-10-CM | POA: Insufficient documentation

## 2019-10-24 DIAGNOSIS — E559 Vitamin D deficiency, unspecified: Secondary | ICD-10-CM | POA: Insufficient documentation

## 2019-11-22 HISTORY — PX: CATARACT EXTRACTION: SUR2

## 2019-12-14 ENCOUNTER — Other Ambulatory Visit: Payer: Self-pay | Admitting: Obstetrics and Gynecology

## 2019-12-14 DIAGNOSIS — Z1231 Encounter for screening mammogram for malignant neoplasm of breast: Secondary | ICD-10-CM

## 2020-01-15 NOTE — Progress Notes (Signed)
69 y.o. G2P2 Married Caucasian female here for annual exam.    No more vaginal bleeding.  Off all hormonal therapy.  Doing ok.   Had cataract surgery.   Dealing with chronic infection.   Had Covid vaccination, completed in April.   Sister ill with a glioblastoma.   PCP:  Esmeralda Links, MD   Patient's last menstrual period was 05/24/2006.           Sexually active: No.  The current method of family planning is tubal ligation.    Exercising: No.  swimming, walking and weight Smoker:  no  Health Maintenance: Pap: 01-12-18  Neg, 12-26-14 Neg:Neg HR HPV History of abnormal Pap:  no MMG: 04-03-19 3D/Neg/density B/Birads1.  Has a appointment.  Colonoscopy: 12/08/15 - 1 polyp;next due 2022  BMD: 04-03-19  Result :Osteoporosis of forearm and Osteopenia of hip--stable. TDaP: PCP Gardasil:   no HIV:04-06-16 NR Hep C:04-06-16 Neg Screening Labs:  PCP   reports that she has never smoked. She has never used smokeless tobacco. She reports current alcohol use of about 4.0 - 5.0 standard drinks of alcohol per week. She reports that she does not use drugs.  Past Medical History:  Diagnosis Date  . Allergic rhinitis   . Anxiety   . Asthma   . Bipolar disorder ([place])   . Celiac disease   . Depression   . Elevated liver function tests 2019  . Elevated testosterone level 2019  . Elevated vitamin B12 level 2019  . History of colonic polyps   . Hypothyroidism   . Ileus following gastrointestinal surgery ([place]) 10/18/2011  . Leukopenia   . Low TSH level 2019  . Malnutrition following gastrointestinal surgery 10/18/2011  . Osteoporosis 2018  . RBBB (right bundle branch block)   . Renal stone 2020    Past Surgical History:  Procedure Laterality Date  . CATARACT EXTRACTION Bilateral 11/2019  . COLON SURGERY    . COLONOSCOPY W/ POLYPECTOMY    . COLOSTOMY TAKEDOWN  05/29/2012   Procedure: LAPAROSCOPIC COLOSTOMY TAKEDOWN;  Surgeon: Odis Hollingshead, MD;  [facility];  Service:  General;  Laterality: N/A;  Laparoscopic Assisted Colostomy Closure  with proctoscopy  . CYSTECTOMY     LEFT FOOT, RIGHT HAND  . DILATATION & CURETTAGE/HYSTEROSCOPY WITH MYOSURE N/A 07/25/2018   Procedure: DILATATION & CURETTAGE/HYSTEROSCOPY WITH MYOSURE RESECTION OF ENDOMETRIAL AND ENDOCERVICAL POLYP;  Surgeon: Nunzio Cobbs, MD;  [facility];  Service: Gynecology;  Laterality: N/A;  . FOOT SURGERY    . FOOT SURGERY Left 12/07/2018   cyst removed  . LAPAROTOMY  10/07/2011   Procedure: EXPLORATORY LAPAROTOMY;  Surgeon: Odis Hollingshead, MD;  [facility];  Service: General;  Laterality: N/A;  hartmann procedure and creation of colostomy  . NASAL SEPTOPLASTY W/ TURBINOPLASTY Bilateral 11/11/2015   Procedure: SEPTOPLASTY BILATERAL TURBINATE  REDUCTION ;  Surgeon: Leta Baptist, MD;  Location: [place];  Service: ENT;  Laterality: Bilateral;  . NASAL SINUS SURGERY Bilateral 11/11/2015   Procedure: BILATERAL ENDOSCOPIC  MAXILLARY ANTROSTOMY;  Surgeon: Leta Baptist, MD;  Location: [place];  Service: ENT;  Laterality: Bilateral;  . SKIN BIOPSY Right 06/04/2019   basal cell carcinoma , nodular pattern  . TONSILLECTOMY AND ADENOIDECTOMY    . TUBAL LIGATION    . UPPER GASTROINTESTINAL ENDOSCOPY  01/19/12    Current Outpatient Medications  Medication Sig Dispense Refill  . ADVAIR DISKUS 100-50 MCG/DOSE AEPB Inhale 1 puff into the  lungs 2 (two) times daily.     Francia Greaves THYROID 120 MG tablet Take 1 tablet by mouth daily.    . cetirizine (ZYRTEC) 10 MG tablet Take 10 mg by mouth every evening.     Marland Kitchen EPINEPHrine (EPI-PEN) 0.3 mg/0.3 mL DEVI Inject 0.3 mg into the muscle once. For previous allergies, maybe gluten    . FLUoxetine (PROZAC) 10 MG capsule Take 3 capsules (30 mg total) by mouth daily. Takes 3 tablets in AM 270 capsule 3  . magnesium gluconate (MAGONATE) 500 MG tablet Take 1,500 mg by mouth every evening.     . Multiple  Minerals-Vitamins (NUTRA-SUPPORT BONE) CAPS Take 1-2 tablets by mouth See admin instructions. Take 1 tablet every morning and 2 tablets every night    . Prednisol Ace-Moxiflox-Bromfen 1-0.5-0.075 % SUSP Place 1 drop into the right eye 2 (two) times daily.    . Probiotic Product (PROBIOTIC DAILY PO) Take by mouth. metaflora IB    . vitamin C (ASCORBIC ACID) 500 MG tablet Take 500 mg by mouth daily.     No current facility-administered medications for this visit.    Family History  Problem Relation Age of Onset  . Heart disease Father   . Osteoporosis Father   . Osteoporosis Mother   . Mental illness Paternal Aunt   . Skin cancer Maternal Grandmother   . Hypertension Maternal Grandfather   . Heart disease Paternal Grandfather   . Celiac disease Grandchild     Review of Systems  All other systems reviewed and are negative.   Exam:   BP 120/72   Pulse 76   Resp 16   Ht 5' 3.5" (1.613 m)   Wt 133 lb (60.3 kg)   LMP 05/24/2006   BMI 23.19 kg/m     General appearance: alert, cooperative and appears stated age Head: normocephalic, without obvious abnormality, atraumatic Neck: no adenopathy, supple, symmetrical, trachea midline and thyroid normal to inspection and palpation Lungs: clear to auscultation bilaterally Breasts: normal appearance, no masses or tenderness, No nipple retraction or dimpling, No nipple discharge or bleeding, No axillary adenopathy Heart: regular rate and rhythm Abdomen: soft, non-tender; no masses, no organomegaly Extremities: extremities normal, atraumatic, no cyanosis or edema Skin: skin color, texture, turgor normal. No rashes or lesions Lymph nodes: cervical, supraclavicular, and axillary nodes normal. Neurologic: grossly normal  Pelvic: External genitalia:  no lesions              No abnormal inguinal nodes palpated.              Urethra:  normal appearing urethra with no masses, tenderness or lesions              Bartholins and Skenes: normal                  Vagina: normal appearing vagina with normal color and discharge, no lesions              Cervix: no lesions.  Ectropion noted.               Pap taken: Yes.   Bimanual Exam:  Uterus:  normal size, contour, position, consistency, mobility, non-tender              Adnexa: no mass, fullness, tenderness              Rectal exam: Yes.  .  Confirms.              Anus:  normal  sphincter tone, no lesions  Chaperone was present for exam.  Assessment:   Well woman visit with normal exam. Osteoporosis of wrist, osteopenia of hip.   Plan: Mammogram screening discussed. Self breast awareness reviewed. Pap and HR HPV as above. Guidelines for Calcium, Vitamin D, regular exercise program including cardiovascular and weight bearing exercise. BMD in 2022.  Follow up annually and prn.

## 2020-01-16 ENCOUNTER — Other Ambulatory Visit: Payer: Self-pay

## 2020-01-16 ENCOUNTER — Other Ambulatory Visit (HOSPITAL_COMMUNITY)
Admission: RE | Admit: 2020-01-16 | Discharge: 2020-01-16 | Disposition: A | Discharge status: Home / Self Care | Payer: Medicare Other | Source: Ambulatory Visit | Attending: Obstetrics and Gynecology | Admitting: Obstetrics and Gynecology

## 2020-01-16 ENCOUNTER — Ambulatory Visit (INDEPENDENT_AMBULATORY_CARE_PROVIDER_SITE_OTHER): Payer: Medicare Other | Admitting: Obstetrics and Gynecology

## 2020-01-16 ENCOUNTER — Encounter: Payer: Self-pay | Admitting: Obstetrics and Gynecology

## 2020-01-16 VITALS — BP 120/72 | HR 76 | Resp 16 | Ht 63.5 in | Wt 133.0 lb

## 2020-01-16 DIAGNOSIS — Z01419 Encounter for gynecological examination (general) (routine) without abnormal findings: Secondary | ICD-10-CM | POA: Insufficient documentation

## 2020-01-16 NOTE — Patient Instructions (Signed)

## 2020-01-17 LAB — CYTOLOGY - PAP: Diagnosis: NEGATIVE

## 2020-04-07 ENCOUNTER — Encounter: Payer: Self-pay | Admitting: Internal Medicine

## 2020-04-07 ENCOUNTER — Other Ambulatory Visit: Payer: Self-pay

## 2020-04-07 ENCOUNTER — Inpatient Hospital Stay: Payer: Medicare Other | Attending: Internal Medicine | Admitting: Internal Medicine

## 2020-04-07 ENCOUNTER — Inpatient Hospital Stay: Payer: Medicare Other

## 2020-04-07 VITALS — BP 124/64 | HR 72 | Temp 98.6°F | Resp 17 | Ht 63.5 in | Wt 131.0 lb

## 2020-04-07 DIAGNOSIS — Z888 Allergy status to other drugs, medicaments and biological substances status: Secondary | ICD-10-CM | POA: Diagnosis not present

## 2020-04-07 DIAGNOSIS — Z85828 Personal history of other malignant neoplasm of skin: Secondary | ICD-10-CM | POA: Diagnosis not present

## 2020-04-07 DIAGNOSIS — Z880 Allergy status to penicillin: Secondary | ICD-10-CM | POA: Diagnosis not present

## 2020-04-07 DIAGNOSIS — D708 Other neutropenia: Secondary | ICD-10-CM

## 2020-04-07 DIAGNOSIS — Z8719 Personal history of other diseases of the digestive system: Secondary | ICD-10-CM | POA: Insufficient documentation

## 2020-04-07 DIAGNOSIS — Z79899 Other long term (current) drug therapy: Secondary | ICD-10-CM | POA: Insufficient documentation

## 2020-04-07 DIAGNOSIS — Z881 Allergy status to other antibiotic agents status: Secondary | ICD-10-CM | POA: Diagnosis not present

## 2020-04-07 DIAGNOSIS — D72819 Decreased white blood cell count, unspecified: Secondary | ICD-10-CM | POA: Insufficient documentation

## 2020-04-07 DIAGNOSIS — D709 Neutropenia, unspecified: Secondary | ICD-10-CM | POA: Diagnosis not present

## 2020-04-07 DIAGNOSIS — Z882 Allergy status to sulfonamides status: Secondary | ICD-10-CM | POA: Diagnosis not present

## 2020-04-07 DIAGNOSIS — Z87442 Personal history of urinary calculi: Secondary | ICD-10-CM | POA: Diagnosis not present

## 2020-04-07 LAB — CBC WITH DIFFERENTIAL (CANCER CENTER ONLY)
Abs Immature Granulocytes: 0 10*3/uL (ref 0.00–0.07)
Basophils Absolute: 0 10*3/uL (ref 0.0–0.1)
Basophils Relative: 0 %
Eosinophils Absolute: 0 10*3/uL (ref 0.0–0.5)
Eosinophils Relative: 1 %
HCT: 37.7 % (ref 36.0–46.0)
Hemoglobin: 12.8 g/dL (ref 12.0–15.0)
Immature Granulocytes: 0 %
Lymphocytes Relative: 18 %
Lymphs Abs: 0.4 10*3/uL — ABNORMAL LOW (ref 0.7–4.0)
MCH: 32.3 pg (ref 26.0–34.0)
MCHC: 34 g/dL (ref 30.0–36.0)
MCV: 95.2 fL (ref 80.0–100.0)
Monocytes Absolute: 0.3 10*3/uL (ref 0.1–1.0)
Monocytes Relative: 14 %
Neutro Abs: 1.5 10*3/uL — ABNORMAL LOW (ref 1.7–7.7)
Neutrophils Relative %: 67 %
Platelet Count: 214 10*3/uL (ref 150–400)
RBC: 3.96 MIL/uL (ref 3.87–5.11)
RDW: 12.5 % (ref 11.5–15.5)
WBC Count: 2.3 10*3/uL — ABNORMAL LOW (ref 4.0–10.5)
nRBC: 0 % (ref 0.0–0.2)

## 2020-04-07 LAB — LACTATE DEHYDROGENASE: LDH: 163 U/L (ref 98–192)

## 2020-04-07 NOTE — Progress Notes (Signed)
[place] [phone]   [phone]  OFFICE PROGRESS NOTE  Pendergraft, Darrick Penna, MD No address on file  DIAGNOSIS: Leukocytopenia of unclear etiology diagnosed in October 2020.  PRIOR THERAPY: None  CURRENT THERAPY: Observation.  INTERVAL HISTORY: Mary Leach 69 y.o. female returns to the clinic today for 6 months follow-up visit.  The patient is feeling fine today with no concerning complaints.  Unfortunately her sister was diagnosed with glioblastoma 6 months ago.  The patient denied having any chest pain, shortness of breath, cough or hemoptysis.  She denied having any nausea, vomiting, diarrhea or constipation.  She has no headache or visual changes.  She has been going treatment for MRSA of the sinuses under ENT at  [facility] [place].  The patient is here today for evaluation and repeat blood work for close monitoring of her neutropenia.  MEDICAL HISTORY: Past Medical History:  Diagnosis Date  . Allergic rhinitis   . Anxiety   . Asthma   . Bipolar disorder ([place])   . Celiac disease   . Depression   . Elevated liver function tests 2019  . Elevated testosterone level 2019  . Elevated vitamin B12 level 2019  . History of colonic polyps   . Hypothyroidism   . Ileus following gastrointestinal surgery ([place]) 10/18/2011  . Leukopenia   . Low TSH level 2019  . Malnutrition following gastrointestinal surgery 10/18/2011  . Osteoporosis 2018  . RBBB (right bundle branch block)   . Renal stone 2020    ALLERGIES:  is allergic to gluten meal, corn oil, soy allergy, augmentin [amoxicillin-pot clavulanate], chocolate, food, other, prevnar [pneumococcal 13-val conj vacc], sulfonamide derivatives, and tape.  MEDICATIONS:  Current Outpatient Medications  Medication Sig Dispense Refill  . ADVAIR DISKUS 100-50 MCG/DOSE AEPB Inhale 1 puff into the lungs 2 (two) times daily.     Francia Greaves THYROID 120 MG tablet Take 1 tablet by mouth daily.    .  cetirizine (ZYRTEC) 10 MG tablet Take 10 mg by mouth every evening.     Marland Kitchen EPINEPHrine (EPI-PEN) 0.3 mg/0.3 mL DEVI Inject 0.3 mg into the muscle once. For previous allergies, maybe gluten    . FLUoxetine (PROZAC) 10 MG capsule Take 3 capsules (30 mg total) by mouth daily. Takes 3 tablets in AM 270 capsule 3  . magnesium gluconate (MAGONATE) 500 MG tablet Take 1,500 mg by mouth every evening.     . Multiple Minerals-Vitamins (NUTRA-SUPPORT BONE) CAPS Take 1-2 tablets by mouth See admin instructions. Take 1 tablet every morning and 2 tablets every night    . Prednisol Ace-Moxiflox-Bromfen 1-0.5-0.075 % SUSP Place 1 drop into the right eye 2 (two) times daily.    . Probiotic Product (PROBIOTIC DAILY PO) Take by mouth. metaflora IB    . vitamin C (ASCORBIC ACID) 500 MG tablet Take 500 mg by mouth daily.     No current facility-administered medications for this visit.    SURGICAL HISTORY:  Past Surgical History:  Procedure Laterality Date  . CATARACT EXTRACTION Bilateral 11/2019  . COLON SURGERY    . COLONOSCOPY W/ POLYPECTOMY    . COLOSTOMY TAKEDOWN  05/29/2012   Procedure: LAPAROSCOPIC COLOSTOMY TAKEDOWN;  Surgeon: Odis Hollingshead, MD;  Location: [facility];  Service: General;  Laterality: N/A;  Laparoscopic Assisted Colostomy Closure  with proctoscopy  . CYSTECTOMY     LEFT FOOT, RIGHT HAND  . DILATATION & CURETTAGE/HYSTEROSCOPY WITH MYOSURE N/A 07/25/2018   Procedure: DILATATION &  CURETTAGE/HYSTEROSCOPY WITH MYOSURE RESECTION OF ENDOMETRIAL AND ENDOCERVICAL POLYP;  Surgeon: Nunzio Cobbs, MD;  Location: [facility];  Service: Gynecology;  Laterality: N/A;  . FOOT SURGERY    . FOOT SURGERY Left 12/07/2018   cyst removed  . LAPAROTOMY  10/07/2011   Procedure: EXPLORATORY LAPAROTOMY;  Surgeon: Odis Hollingshead, MD;  Location: [facility];  Service: General;  Laterality: N/A;  hartmann procedure and creation of colostomy  . NASAL SEPTOPLASTY W/ TURBINOPLASTY Bilateral  11/11/2015   Procedure: SEPTOPLASTY BILATERAL TURBINATE  REDUCTION ;  Surgeon: Leta Baptist, MD;  Location: [place];  Service: ENT;  Laterality: Bilateral;  . NASAL SINUS SURGERY Bilateral 11/11/2015   Procedure: BILATERAL ENDOSCOPIC  MAXILLARY ANTROSTOMY;  Surgeon: Leta Baptist, MD;  Location: [place];  Service: ENT;  Laterality: Bilateral;  . SKIN BIOPSY Right 06/04/2019   basal cell carcinoma , nodular pattern  . TONSILLECTOMY AND ADENOIDECTOMY    . TUBAL LIGATION    . UPPER GASTROINTESTINAL ENDOSCOPY  01/19/12    REVIEW OF SYSTEMS:  A comprehensive review of systems was negative.   PHYSICAL EXAMINATION: General appearance: alert, cooperative and no distress Head: Normocephalic, without obvious abnormality, atraumatic Neck: no adenopathy, no JVD, supple, symmetrical, trachea midline and thyroid not enlarged, symmetric, no tenderness/mass/nodules Lymph nodes: Cervical, supraclavicular, and axillary nodes normal. Resp: clear to auscultation bilaterally Back: symmetric, no curvature. ROM normal. No CVA tenderness. Cardio: regular rate and rhythm, S1, S2 normal, no murmur, click, rub or gallop GI: soft, non-tender; bowel sounds normal; no masses,  no organomegaly Extremities: extremities normal, atraumatic, no cyanosis or edema  ECOG PERFORMANCE STATUS: 1 - Symptomatic but completely ambulatory  Blood pressure 124/64, pulse 72, temperature 98.6 F (37 C), temperature source Tympanic, resp. rate 17, height 5' 3.5" (1.613 m), weight 131 lb (59.4 kg), last menstrual period 05/24/2006, SpO2 100 %.  LABORATORY DATA: Lab Results  Component Value Date   WBC 2.3 (L) 04/07/2020   HGB 12.8 04/07/2020   HCT 37.7 04/07/2020   MCV 95.2 04/07/2020   PLT 214 04/07/2020      Chemistry      Component Value Date/Time   NA 134 (L) 10/04/2019 1001   NA 134 (A) 07/22/2018 0000   K 4.3 10/04/2019 1001   CL 101 10/04/2019 1001   CO2 26 10/04/2019 1001   BUN 14 10/04/2019  1001   BUN 24 (A) 07/22/2018 0000   CREATININE 0.71 10/04/2019 1001   CREATININE 0.57 12/26/2014 1423   GLU 92 07/22/2018 0000      Component Value Date/Time   CALCIUM 8.8 (L) 10/04/2019 1001   ALKPHOS 105 10/04/2019 1001   AST 26 10/04/2019 1001   ALT 19 10/04/2019 1001   BILITOT 0.6 10/04/2019 1001       RADIOGRAPHIC STUDIES: No results found.  ASSESSMENT AND PLAN: This is a very pleasant 69 years old white female with persistent leukocytopenia of unclear etiology.  She is currently treated for fungal infection and her allergy test was positive for many fungal types.  She also has MRSA infection of the sinuses. The patient is currently on observation and she is feeling fine. Repeat CBC today showed persistent but mildly improved leukopenia and neutropenia.  Her absolute neutrophil count is 1500. I recommended for the patient to continue on observation with repeat CBC, comprehensive metabolic panel and LDH in 6 months. The patient was advised to call immediately if she has any concerning symptoms in the interval.  The patient voices understanding of current disease status and treatment options and is in agreement with the current care plan.  All questions were answered. The patient knows to call the clinic with any problems, questions or concerns. We can certainly see the patient much sooner if necessary.  Disclaimer: This note was dictated with voice recognition software. Similar sounding words can inadvertently be transcribed and may not be corrected upon review.

## 2020-04-08 ENCOUNTER — Telehealth: Payer: Self-pay | Admitting: Internal Medicine

## 2020-04-08 NOTE — Telephone Encounter (Signed)
Scheduled per los. Called and spoke with patient. Confirmed appt 

## 2020-04-23 ENCOUNTER — Other Ambulatory Visit: Payer: Self-pay

## 2020-04-23 ENCOUNTER — Ambulatory Visit
Admission: RE | Admit: 2020-04-23 | Discharge: 2020-04-23 | Disposition: A | Discharge status: Home / Self Care | Payer: Medicare Other | Source: Ambulatory Visit | Attending: Obstetrics and Gynecology | Admitting: Obstetrics and Gynecology

## 2020-04-23 DIAGNOSIS — Z1231 Encounter for screening mammogram for malignant neoplasm of breast: Secondary | ICD-10-CM

## 2020-06-13 ENCOUNTER — Other Ambulatory Visit: Payer: Self-pay

## 2020-06-13 ENCOUNTER — Other Ambulatory Visit: Payer: Medicare Other

## 2020-06-13 DIAGNOSIS — Z20822 Contact with and (suspected) exposure to covid-19: Secondary | ICD-10-CM

## 2020-06-14 LAB — SARS-COV-2, NAA 2 DAY TAT

## 2020-06-14 LAB — NOVEL CORONAVIRUS, NAA: SARS-CoV-2, NAA: DETECTED — AB

## 2020-06-15 ENCOUNTER — Other Ambulatory Visit (HOSPITAL_COMMUNITY): Payer: Self-pay | Admitting: Infectious Diseases

## 2020-06-15 ENCOUNTER — Telehealth: Payer: Self-pay | Admitting: Infectious Diseases

## 2020-06-15 MED ORDER — MOLNUPIRAVIR EUA 200MG CAPSULE: 4.0000 | ORAL_CAPSULE | Freq: Two times a day (BID) | ORAL | 0 refills | Status: AC

## 2020-06-15 NOTE — Telephone Encounter (Signed)
Outpatient Oral COVID Treatment Note  I connected with Mary Leach on 06/15/2020/3:34 PM by telephone and verified that I am speaking with the correct person using two identifiers.  I discussed the limitations, risks, security, and privacy concerns of performing an evaluation and management service by telephone and the availability of in person appointments. I also discussed with the patient that there may be a patient responsible charge related to this service. The patient expressed understanding and agreed to proceed.  Patient location: [facility]  Provider location: Provider Home, [place]   Diagnosis: COVID-19 infection  Purpose of visit: Discussion of potential use of Molnupiravir or Paxlovid, a new treatment for mild to moderate COVID-19 viral infection in non-hospitalized patients.   Subjective: Patient is a 70 y.o. female who has been diagnosed with COVID 19 viral infection.  Their symptoms began on 06/13/2020 with fevers, fatigue, body aches .    Past Medical History:  Diagnosis Date  . Allergic rhinitis   . Anxiety   . Asthma   . Bipolar disorder ([place])   . Celiac disease   . Depression   . Elevated liver function tests 2019  . Elevated testosterone level 2019  . Elevated vitamin B12 level 2019  . History of colonic polyps   . Hypothyroidism   . Ileus following gastrointestinal surgery ([place]) 10/18/2011  . Leukopenia   . Low TSH level 2019  . Malnutrition following gastrointestinal surgery 10/18/2011  . Osteoporosis 2018  . RBBB (right bundle branch block)   . Renal stone 2020    Allergies  Allergen Reactions  . Gluten Meal Other (See Comments)  . Corn Oil Diarrhea  . Soy Allergy Diarrhea  . Augmentin [Amoxicillin-Pot Clavulanate] Diarrhea  . Chocolate   . Food   . Other     **Celiac Disease* Multiple food intolerances. Eggs, corn, potatoes, yeast, [place], buckwheat, sesame , soy.  Also observes a dairy and wheat free diet.  Marland Kitchen Prevnar [Pneumococcal 13-Val Conj  Vacc]     Vaccine contains yeast   . Sulfa Antibiotics   . Sulfonamide Derivatives Rash    Arms only.  . Tape Rash     Current Outpatient Medications:  .  ARMOUR THYROID 120 MG tablet, Take 1 tablet by mouth daily., Disp: , Rfl:  .  cetirizine (ZYRTEC) 10 MG tablet, Take 10 mg by mouth every evening. , Disp: , Rfl:  .  cholecalciferol (VITAMIN D3) 25 MCG (1000 UNIT) tablet, Take 1,000 Units by mouth daily., Disp: , Rfl:  .  FLUoxetine (PROZAC) 10 MG capsule, Take 3 capsules (30 mg total) by mouth daily. Takes 3 tablets in AM, Disp: 270 capsule, Rfl: 3 .  magnesium gluconate (MAGONATE) 500 MG tablet, Take 1,500 mg by mouth every evening. , Disp: , Rfl:  .  Multiple Minerals-Vitamins (NUTRA-SUPPORT BONE) CAPS, Take 1-2 tablets by mouth See admin instructions. Take 1 tablet every morning and 2 tablets every night, Disp: , Rfl:  .  Probiotic Product (PROBIOTIC DAILY PO), Take by mouth. metaflora IB, Disp: , Rfl:  .  vitamin C (ASCORBIC ACID) 500 MG tablet, Take 500 mg by mouth daily., Disp: , Rfl:  .  vitamin k 100 MCG tablet, Take 100 mcg by mouth daily., Disp: , Rfl:   Objective: Patient appears/sounds mildly ill.  They are in no apparent distress.  Breathing is non labored.  Mood and behavior are normal.  Laboratory Data:  Recent Results (from the past 2160 hour(s))  Lactate dehydrogenase (LDH)  Status: None   Collection Time: 04/07/20  9:18 AM  Result Value Ref Range   LDH 163 98 - 192 U/L    Comment: Performed at [facility], [place] [address], [place], [place] [address]  CBC with Differential ([place] Only)     Status: Abnormal   Collection Time: 04/07/20  9:18 AM  Result Value Ref Range   WBC Count 2.3 (L) 4.0 - 10.5 K/uL   RBC 3.96 3.87 - 5.11 MIL/uL   Hemoglobin 12.8 12.0 - 15.0 g/dL   HCT 37.7 36.0 - 46.0 %   MCV 95.2 80.0 - 100.0 fL   MCH 32.3 26.0 - 34.0 pg   MCHC 34.0 30.0 - 36.0 g/dL   RDW 12.5 11.5 - 15.5 %   Platelet Count 214 150 -  400 K/uL   nRBC 0.0 0.0 - 0.2 %   Neutrophils Relative % 67 %   Neutro Abs 1.5 (L) 1.7 - 7.7 K/uL   Lymphocytes Relative 18 %   Lymphs Abs 0.4 (L) 0.7 - 4.0 K/uL   Monocytes Relative 14 %   Monocytes Absolute 0.3 0.1 - 1.0 K/uL   Eosinophils Relative 1 %   Eosinophils Absolute 0.0 0.0 - 0.5 K/uL   Basophils Relative 0 %   Basophils Absolute 0.0 0.0 - 0.1 K/uL   Immature Granulocytes 0 %   Abs Immature Granulocytes 0.00 0.00 - 0.07 K/uL    Comment: Performed at [facility], [place] [address], [place], [place] [address]  Novel Coronavirus, NAA ([facility])     Status: Abnormal   Collection Time: 06/13/20 10:26 AM   Specimen: Nasopharyngeal(NP) swabs in vial transport medium   Nasopharynge  Screenin  Result Value Ref Range   SARS-CoV-2, NAA Detected (A) Not Detected    Comment: Patients who have a positive COVID-19 test result may now have treatment options. Treatment options are available for patients with mild to moderate symptoms and for hospitalized patients. Visit our website at [email or web address] for resources and information. This nucleic acid amplification test was developed and its performance characteristics determined by [employer]. Nucleic acid amplification tests include RT-PCR and TMA. This test has not been FDA cleared or approved. This test has been authorized by FDA under an Emergency Use Authorization (EUA). This test is only authorized for the duration of time the declaration that circumstances exist justifying the authorization of the emergency use of in vitro diagnostic tests for detection of SARS-CoV-2 virus and/or diagnosis of COVID-19 infection under section 564(b)(1) of the Act, 21 U.S.C. 093OIZ-1(I) (1), unless the authorization is terminated or revoked sooner. When diagnostic testing is negativ e, the possibility of a false negative result should be considered in the context of a patient's recent exposures and  the presence of clinical signs and symptoms consistent with COVID-19. An individual without symptoms of COVID-19 and who is not shedding SARS-CoV-2 virus would expect to have a negative (not detected) result in this assay.   SARS-COV-2, NAA 2 DAY TAT     Status: None   Collection Time: 06/13/20 10:26 AM   Nasopharynge  Screenin  Result Value Ref Range   SARS-CoV-2, NAA 2 DAY TAT Performed      Assessment: 70 y.o. female with mild/moderate COVID 19 viral infection diagnosed on 06/14/2020 at high risk for progression to severe COVID 19.  Plan:  This patient is a 70 y.o. female that meets the following criteria for Emergency Use Authorization of: Molnupiravir  1. Age >10  yr 2. SARS-COV-2 positive test 3. Symptom onset < 5 days 4. Mild-to-moderate COVID disease with high risk for severe progression to hospitalization or death   I have spoken and communicated the following to the patient or parent/caregiver regarding: 1. Molnupiravir is an unapproved drug that is authorized for use under an [employer] production pl[employer].  2. There are no adequate, approved, available products for the treatment of COVID-19 in adults who have mild-to-moderate COVID-19 and are at high risk for progressing to severe COVID-19, including hospitalization or death. 3. Other therapeutics are currently authorized. For additional information on all products authorized for treatment or prevention of COVID-19, please see [email or web address].  4. There are benefits and risks of taking this treatment as outlined in the "Fact Sheet for Patients and Caregivers."  5. "Fact Sheet for Patients and Caregivers" was reviewed with patient. A hard copy will be provided to patient from pharmacy prior to the patient receiving treatment. 6. Patients should continue to self-isolate and use infection control measures (e.g., wear mask,  isolate, social distance, avoid sharing personal items, clean and disinfect "high touch" surfaces, and frequent handwashing) according to CDC guidelines.  7. The patient or parent/caregiver has the option to accept or refuse treatment. 8. [facility] has established a pregnancy surveillance program. 9. Females of childbearing potential should use a reliable method of contraception correctly and consistently, as applicable, for the duration of treatment and for 4 days after the last dose of Molnupiravir. 3. Males of reproductive potential who are sexually active with females of childbearing potential should use a reliable method of contraception correctly and consistently during treatment and for at least 3 months after the last dose. 11. Pregnancy status and risk was assessed. Patient verbalized understanding of precautions.   After reviewing above information with the patient, the patient agrees to receive molnupiravir.  Follow up instructions:    . Take prescription BID x 5 days as directed . Reach out to pharmacist for counseling on medication if desired . For concerns regarding further COVID symptoms please follow up with your PCP or urgent care . For urgent or life-threatening issues, seek care at your local emergency department  The patient was provided an opportunity to ask questions, and all were answered. The patient agreed with the plan and demonstrated an understanding of the instructions.   Script sent to [facility] and opted to pick up RX.  The patient was advised to call their PCP or seek an in-person evaluation if the symptoms worsen or if the condition fails to improve as anticipated.   I provided 15 minutes of non face-to-face telephone visit time during this encounter, and > 50% was spent counseling as documented under my assessment & plan.  Janene Madeira, NP 06/15/2020 /3:34 PM

## 2020-06-16 ENCOUNTER — Telehealth (HOSPITAL_COMMUNITY): Payer: Self-pay

## 2020-06-16 MED FILL — MOLNUPIRAVIR 200 MG CAPS: 200 | 5 days supply | Qty: 40 | Fill #0

## 2020-06-16 NOTE — Telephone Encounter (Signed)
Patient was prescribed oral covid treatment Molnupiravir and treatment note was reviewed. Medication has been received by [facility] and reviewed for appropriateness.  Drug Interactions or Dosage Adjustments Noted: none  Delivery Method: pick up  Patient contacted for counseling on 06/16/20 and verbalized understanding.   Delivery or Pick-Up Date: 06/16/20   Alinda Dooms 06/16/2020, 8:44 AM [facility] Outpatient Pharmacist Phone# [phone]

## 2020-09-15 ENCOUNTER — Other Ambulatory Visit (HOSPITAL_COMMUNITY): Payer: Self-pay

## 2020-10-06 ENCOUNTER — Inpatient Hospital Stay: Payer: Medicare Other

## 2020-10-06 ENCOUNTER — Inpatient Hospital Stay: Payer: Medicare Other | Attending: Internal Medicine | Admitting: Internal Medicine

## 2020-10-06 ENCOUNTER — Encounter: Payer: Self-pay | Admitting: Internal Medicine

## 2020-10-06 ENCOUNTER — Other Ambulatory Visit: Payer: Self-pay

## 2020-10-06 VITALS — BP 147/77 | HR 74 | Temp 98.3°F | Resp 19 | Ht 63.5 in | Wt 129.0 lb

## 2020-10-06 DIAGNOSIS — Z888 Allergy status to other drugs, medicaments and biological substances status: Secondary | ICD-10-CM | POA: Diagnosis not present

## 2020-10-06 DIAGNOSIS — Z882 Allergy status to sulfonamides status: Secondary | ICD-10-CM | POA: Diagnosis not present

## 2020-10-06 DIAGNOSIS — Z79899 Other long term (current) drug therapy: Secondary | ICD-10-CM | POA: Diagnosis not present

## 2020-10-06 DIAGNOSIS — D709 Neutropenia, unspecified: Secondary | ICD-10-CM

## 2020-10-06 DIAGNOSIS — D539 Nutritional anemia, unspecified: Secondary | ICD-10-CM

## 2020-10-06 DIAGNOSIS — Z880 Allergy status to penicillin: Secondary | ICD-10-CM | POA: Insufficient documentation

## 2020-10-06 DIAGNOSIS — D72819 Decreased white blood cell count, unspecified: Secondary | ICD-10-CM | POA: Insufficient documentation

## 2020-10-06 DIAGNOSIS — Z85828 Personal history of other malignant neoplasm of skin: Secondary | ICD-10-CM | POA: Insufficient documentation

## 2020-10-06 DIAGNOSIS — R5383 Other fatigue: Secondary | ICD-10-CM | POA: Diagnosis not present

## 2020-10-06 DIAGNOSIS — Z881 Allergy status to other antibiotic agents status: Secondary | ICD-10-CM | POA: Diagnosis not present

## 2020-10-06 LAB — CMP (CANCER CENTER ONLY)
ALT: 21 U/L (ref 0–44)
AST: 25 U/L (ref 15–41)
Albumin: 3.5 g/dL (ref 3.5–5.0)
Alkaline Phosphatase: 117 U/L (ref 38–126)
Anion gap: 5 (ref 5–15)
BUN: 16 mg/dL (ref 8–23)
CO2: 27 mmol/L (ref 22–32)
Calcium: 8.7 mg/dL — ABNORMAL LOW (ref 8.9–10.3)
Chloride: 102 mmol/L (ref 98–111)
Creatinine: 0.72 mg/dL (ref 0.44–1.00)
GFR, Estimated: >60 mL/min
Glucose, Bld: 115 mg/dL — ABNORMAL HIGH (ref 70–99)
Potassium: 4.1 mmol/L (ref 3.5–5.1)
Sodium: 134 mmol/L — ABNORMAL LOW (ref 135–145)
Total Bilirubin: 0.2 mg/dL — ABNORMAL LOW (ref 0.3–1.2)
Total Protein: 6.8 g/dL (ref 6.5–8.1)

## 2020-10-06 LAB — CBC WITH DIFFERENTIAL (CANCER CENTER ONLY)
Abs Immature Granulocytes: 0 10*3/uL (ref 0.00–0.07)
Basophils Absolute: 0 10*3/uL (ref 0.0–0.1)
Basophils Relative: 0 %
Eosinophils Absolute: 0.1 10*3/uL (ref 0.0–0.5)
Eosinophils Relative: 3 %
HCT: 28.5 % — ABNORMAL LOW (ref 36.0–46.0)
Hemoglobin: 9.6 g/dL — ABNORMAL LOW (ref 12.0–15.0)
Immature Granulocytes: 0 %
Lymphocytes Relative: 19 %
Lymphs Abs: 0.5 10*3/uL — ABNORMAL LOW (ref 0.7–4.0)
MCH: 30.1 pg (ref 26.0–34.0)
MCHC: 33.7 g/dL (ref 30.0–36.0)
MCV: 89.3 fL (ref 80.0–100.0)
Monocytes Absolute: 0.3 10*3/uL (ref 0.1–1.0)
Monocytes Relative: 11 %
Neutro Abs: 1.6 10*3/uL — ABNORMAL LOW (ref 1.7–7.7)
Neutrophils Relative %: 67 %
Platelet Count: 216 10*3/uL (ref 150–400)
RBC: 3.19 MIL/uL — ABNORMAL LOW (ref 3.87–5.11)
RDW: 13 % (ref 11.5–15.5)
WBC Count: 2.4 10*3/uL — ABNORMAL LOW (ref 4.0–10.5)
nRBC: 0 % (ref 0.0–0.2)

## 2020-10-06 LAB — LACTATE DEHYDROGENASE: LDH: 148 U/L (ref 98–192)

## 2020-10-06 NOTE — Progress Notes (Signed)
[place] [phone]   [phone]  OFFICE PROGRESS NOTE  Pendergraft, Darrick Penna, MD No address on file  DIAGNOSIS: Leukocytopenia of unclear etiology diagnosed in October 2020.  PRIOR THERAPY: None  CURRENT THERAPY: Observation.  INTERVAL HISTORY: Mary Leach 70 y.o. female returns to the clinic today for follow-up visit.  The patient is feeling fine except for fatigue.  She was blaming her fatigue on taking care of her grandchildren.  She denied having any current chest pain, shortness of breath, cough or hemoptysis.  She denied having any fever or chills.  She has no nausea, vomiting, diarrhea or constipation.  She denied having any bleeding, bruises or ecchymosis.  She is here today for evaluation and repeat CBC.  MEDICAL HISTORY: Past Medical History:  Diagnosis Date  . Allergic rhinitis   . Anxiety   . Asthma   . Bipolar disorder ([place])   . Celiac disease   . Depression   . Elevated liver function tests 2019  . Elevated testosterone level 2019  . Elevated vitamin B12 level 2019  . History of colonic polyps   . Hypothyroidism   . Ileus following gastrointestinal surgery ([place]) 10/18/2011  . Leukopenia   . Low TSH level 2019  . Malnutrition following gastrointestinal surgery 10/18/2011  . Osteoporosis 2018  . RBBB (right bundle branch block)   . Renal stone 2020    ALLERGIES:  is allergic to gluten meal, corn oil, soy allergy, augmentin [amoxicillin-pot clavulanate], chocolate, food, other, prevnar [pneumococcal 13-val conj vacc], sulfa antibiotics, sulfonamide derivatives, and tape.  MEDICATIONS:  Current Outpatient Medications  Medication Sig Dispense Refill  . ARMOUR THYROID 120 MG tablet Take 1 tablet by mouth daily.    . cetirizine (ZYRTEC) 10 MG tablet Take 10 mg by mouth every evening.     . cholecalciferol (VITAMIN D3) 25 MCG (1000 UNIT) tablet Take 1,000 Units by mouth daily.    Marland Kitchen FLUoxetine (PROZAC) 10 MG  capsule Take 3 capsules (30 mg total) by mouth daily. Takes 3 tablets in AM 270 capsule 3  . magnesium gluconate (MAGONATE) 500 MG tablet Take 1,500 mg by mouth every evening.     . Molnupiravir 200 MG CAPS TAKE 4 CAPSULES (800 MG TOTAL) BY MOUTH 2 (TWO) TIMES DAILY FOR 5 DAYS. 40 capsule 0  . Multiple Minerals-Vitamins (NUTRA-SUPPORT BONE) CAPS Take 1-2 tablets by mouth See admin instructions. Take 1 tablet every morning and 2 tablets every night    . Probiotic Product (PROBIOTIC DAILY PO) Take by mouth. metaflora IB    . vitamin C (ASCORBIC ACID) 500 MG tablet Take 500 mg by mouth daily.    . vitamin k 100 MCG tablet Take 100 mcg by mouth daily.     No current facility-administered medications for this visit.    SURGICAL HISTORY:  Past Surgical History:  Procedure Laterality Date  . CATARACT EXTRACTION Bilateral 11/2019  . COLON SURGERY    . COLONOSCOPY W/ POLYPECTOMY    . COLOSTOMY TAKEDOWN  05/29/2012   Procedure: LAPAROSCOPIC COLOSTOMY TAKEDOWN;  Surgeon: Odis Hollingshead, MD;  Location: [facility];  Service: General;  Laterality: N/A;  Laparoscopic Assisted Colostomy Closure  with proctoscopy  . CYSTECTOMY     LEFT FOOT, RIGHT HAND  . DILATATION & CURETTAGE/HYSTEROSCOPY WITH MYOSURE N/A 07/25/2018   Procedure: DILATATION & CURETTAGE/HYSTEROSCOPY WITH MYOSURE RESECTION OF ENDOMETRIAL AND ENDOCERVICAL POLYP;  Surgeon: Nunzio Cobbs, MD;  Location: [facility];  Service: Gynecology;  Laterality: N/A;  . FOOT SURGERY    . FOOT SURGERY Left 12/07/2018   cyst removed  . LAPAROTOMY  10/07/2011   Procedure: EXPLORATORY LAPAROTOMY;  Surgeon: Odis Hollingshead, MD;  Location: [facility];  Service: General;  Laterality: N/A;  hartmann procedure and creation of colostomy  . NASAL SEPTOPLASTY W/ TURBINOPLASTY Bilateral 11/11/2015   Procedure: SEPTOPLASTY BILATERAL TURBINATE  REDUCTION ;  Surgeon: Leta Baptist, MD;  Location: [place];  Service: ENT;  Laterality:  Bilateral;  . NASAL SINUS SURGERY Bilateral 11/11/2015   Procedure: BILATERAL ENDOSCOPIC  MAXILLARY ANTROSTOMY;  Surgeon: Leta Baptist, MD;  Location: [place];  Service: ENT;  Laterality: Bilateral;  . SKIN BIOPSY Right 06/04/2019   basal cell carcinoma , nodular pattern  . TONSILLECTOMY AND ADENOIDECTOMY    . TUBAL LIGATION    . UPPER GASTROINTESTINAL ENDOSCOPY  01/19/12    REVIEW OF SYSTEMS:  A comprehensive review of systems was negative except for: Constitutional: positive for fatigue   PHYSICAL EXAMINATION: General appearance: alert, cooperative, fatigued and no distress Head: Normocephalic, without obvious abnormality, atraumatic Neck: no adenopathy, no JVD, supple, symmetrical, trachea midline and thyroid not enlarged, symmetric, no tenderness/mass/nodules Lymph nodes: Cervical, supraclavicular, and axillary nodes normal. Resp: clear to auscultation bilaterally Back: symmetric, no curvature. ROM normal. No CVA tenderness. Cardio: regular rate and rhythm, S1, S2 normal, no murmur, click, rub or gallop GI: soft, non-tender; bowel sounds normal; no masses,  no organomegaly Extremities: extremities normal, atraumatic, no cyanosis or edema  ECOG PERFORMANCE STATUS: 1 - Symptomatic but completely ambulatory  Blood pressure (!) 147/77, pulse 74, temperature 98.3 F (36.8 C), temperature source Tympanic, resp. rate 19, height 5' 3.5" (1.613 m), weight 129 lb (58.5 kg), last menstrual period 05/24/2006, SpO2 100 %.  LABORATORY DATA: Lab Results  Component Value Date   WBC 2.4 (L) 10/06/2020   HGB 9.6 (L) 10/06/2020   HCT 28.5 (L) 10/06/2020   MCV 89.3 10/06/2020   PLT 216 10/06/2020      Chemistry      Component Value Date/Time   NA 134 (L) 10/04/2019 1001   NA 134 (A) 07/22/2018 0000   K 4.3 10/04/2019 1001   CL 101 10/04/2019 1001   CO2 26 10/04/2019 1001   BUN 14 10/04/2019 1001   BUN 24 (A) 07/22/2018 0000   CREATININE 0.71 10/04/2019 1001   CREATININE  0.57 12/26/2014 1423   GLU 92 07/22/2018 0000      Component Value Date/Time   CALCIUM 8.8 (L) 10/04/2019 1001   ALKPHOS 105 10/04/2019 1001   AST 26 10/04/2019 1001   ALT 19 10/04/2019 1001   BILITOT 0.6 10/04/2019 1001       RADIOGRAPHIC STUDIES: No results found.  ASSESSMENT AND PLAN: This is a very pleasant 70 years old white female with persistent leukocytopenia of unclear etiology.  She is currently treated for fungal infection and her allergy test was positive for many fungal types.  She also has MRSA infection of the sinuses. The patient has been doing fine with no concerning complaints except for fatigue. Repeat CBC showed stable total white blood count as well as absolute neutrophil count but I noticed drop in her hemoglobin and hematocrit in the last few months.  She is a scheduled for colonoscopy in July 2022. I recommended for the patient to have stool for Hemoccult performed next week and if positive she will need to have GI evaluation sooner than July. I will  also arrange for the patient to have repeat CBC and anemia panel in 3 months.  She was encouraged to increase her oral iron intake. The patient was also advised to call immediately if she has any concerning symptoms in the interval. The patient voices understanding of current disease status and treatment options and is in agreement with the current care plan.  All questions were answered. The patient knows to call the clinic with any problems, questions or concerns. We can certainly see the patient much sooner if necessary.  Disclaimer: This note was dictated with voice recognition software. Similar sounding words can inadvertently be transcribed and may not be corrected upon review.

## 2020-10-09 ENCOUNTER — Telehealth: Payer: Self-pay | Admitting: Internal Medicine

## 2020-10-09 NOTE — Telephone Encounter (Signed)
Scheduled per los. Called, not able to leave msg. Mailed printout  

## 2020-10-10 DIAGNOSIS — D72819 Decreased white blood cell count, unspecified: Secondary | ICD-10-CM | POA: Diagnosis not present

## 2020-10-10 LAB — OCCULT BLOOD X 1 CARD TO LAB, STOOL
Fecal Occult Bld: POSITIVE — AB
Fecal Occult Bld: POSITIVE — AB
Fecal Occult Bld: POSITIVE — AB

## 2020-10-13 DIAGNOSIS — E618 Deficiency of other specified nutrient elements: Secondary | ICD-10-CM | POA: Insufficient documentation

## 2020-10-13 DIAGNOSIS — K921 Melena: Secondary | ICD-10-CM | POA: Insufficient documentation

## 2020-10-14 ENCOUNTER — Telehealth: Payer: Self-pay

## 2020-10-14 NOTE — Telephone Encounter (Signed)
Opened in error

## 2020-10-14 NOTE — Telephone Encounter (Signed)
Pt has returned my call and advised she see's Dr. Juanita Craver at [facility]. Pt actively see's this provider and was advised her stool cards were positive for blood and to please follow-up with her GI asap. Pt expressed understanding of this information and advised she will schedule an appt with them.

## 2020-10-14 NOTE — Telephone Encounter (Signed)
I LM for pt requesting a return call with this information.

## 2020-10-14 NOTE — Telephone Encounter (Signed)
-----   Message from Curt Bears, MD sent at 10/13/2020  9:17 PM EDT ----- We will need to refer her to gastroenterology.  Please check with the patient and see who is the gastroenterologist she has seen in the past.  Thank you. ----- Message ----- From: [place]: 10/06/2020   3:14 PM EDT To: Curt Bears, MD

## 2020-12-04 ENCOUNTER — Telehealth: Payer: Self-pay | Admitting: Medical Oncology

## 2020-12-04 NOTE — Telephone Encounter (Signed)
Referred back to [facility] for "Low iron "  By PCP  -Dr Carolynne [facility] [place], [place].  06/01 -pt read me the results  " Iron =25 Iron sat =7 Ferritin =10 She did not have CBC but will call back when she finds it.   She has  a Colonoscopy scheduled for  Monday July 18  with Dr Collene Mares.  I told her I will update Dr Julien Nordmann .   Next appt here is August 16th.

## 2020-12-05 ENCOUNTER — Telehealth: Payer: Self-pay | Admitting: Internal Medicine

## 2020-12-05 NOTE — Telephone Encounter (Signed)
Scheduled appointment per 07/15 sch msg. Patient is aware.

## 2020-12-16 ENCOUNTER — Other Ambulatory Visit: Payer: Self-pay

## 2020-12-16 ENCOUNTER — Inpatient Hospital Stay: Payer: Medicare Other

## 2020-12-16 ENCOUNTER — Inpatient Hospital Stay: Payer: Medicare Other | Attending: Internal Medicine | Admitting: Internal Medicine

## 2020-12-16 ENCOUNTER — Encounter: Payer: Self-pay | Admitting: Internal Medicine

## 2020-12-16 VITALS — BP 106/62 | HR 65 | Temp 98.6°F | Resp 18 | Wt 126.4 lb

## 2020-12-16 DIAGNOSIS — Z888 Allergy status to other drugs, medicaments and biological substances status: Secondary | ICD-10-CM | POA: Diagnosis not present

## 2020-12-16 DIAGNOSIS — Z87442 Personal history of urinary calculi: Secondary | ICD-10-CM | POA: Insufficient documentation

## 2020-12-16 DIAGNOSIS — Z880 Allergy status to penicillin: Secondary | ICD-10-CM | POA: Diagnosis not present

## 2020-12-16 DIAGNOSIS — R5383 Other fatigue: Secondary | ICD-10-CM | POA: Diagnosis not present

## 2020-12-16 DIAGNOSIS — D539 Nutritional anemia, unspecified: Secondary | ICD-10-CM

## 2020-12-16 DIAGNOSIS — Z881 Allergy status to other antibiotic agents status: Secondary | ICD-10-CM | POA: Diagnosis not present

## 2020-12-16 DIAGNOSIS — D72819 Decreased white blood cell count, unspecified: Secondary | ICD-10-CM | POA: Insufficient documentation

## 2020-12-16 DIAGNOSIS — Z882 Allergy status to sulfonamides status: Secondary | ICD-10-CM | POA: Insufficient documentation

## 2020-12-16 DIAGNOSIS — Z8719 Personal history of other diseases of the digestive system: Secondary | ICD-10-CM | POA: Insufficient documentation

## 2020-12-16 DIAGNOSIS — Z85828 Personal history of other malignant neoplasm of skin: Secondary | ICD-10-CM | POA: Insufficient documentation

## 2020-12-16 DIAGNOSIS — Z79899 Other long term (current) drug therapy: Secondary | ICD-10-CM | POA: Insufficient documentation

## 2020-12-16 LAB — CBC WITH DIFFERENTIAL (CANCER CENTER ONLY)
Abs Immature Granulocytes: 0.01 10*3/uL (ref 0.00–0.07)
Basophils Absolute: 0 10*3/uL (ref 0.0–0.1)
Basophils Relative: 1 %
Eosinophils Absolute: 0.1 10*3/uL (ref 0.0–0.5)
Eosinophils Relative: 3 %
HCT: 25.9 % — ABNORMAL LOW (ref 36.0–46.0)
Hemoglobin: 8.1 g/dL — ABNORMAL LOW (ref 12.0–15.0)
Immature Granulocytes: 1 %
Lymphocytes Relative: 18 %
Lymphs Abs: 0.4 10*3/uL — ABNORMAL LOW (ref 0.7–4.0)
MCH: 24.8 pg — ABNORMAL LOW (ref 26.0–34.0)
MCHC: 31.3 g/dL (ref 30.0–36.0)
MCV: 79.2 fL — ABNORMAL LOW (ref 80.0–100.0)
Monocytes Absolute: 0.3 10*3/uL (ref 0.1–1.0)
Monocytes Relative: 14 %
Neutro Abs: 1.4 10*3/uL — ABNORMAL LOW (ref 1.7–7.7)
Neutrophils Relative %: 63 %
Platelet Count: 254 10*3/uL (ref 150–400)
RBC: 3.27 MIL/uL — ABNORMAL LOW (ref 3.87–5.11)
RDW: 14.4 % (ref 11.5–15.5)
WBC Count: 2.2 10*3/uL — ABNORMAL LOW (ref 4.0–10.5)
nRBC: 0 % (ref 0.0–0.2)

## 2020-12-16 NOTE — Progress Notes (Signed)
[place] [phone]   Fax:(336) [phone]  OFFICE PROGRESS NOTE  Pendergraft, Darrick Penna, MD No address on file  DIAGNOSIS: Leukocytopenia of unclear etiology diagnosed in October 2020.  PRIOR THERAPY: None  CURRENT THERAPY: Observation.  INTERVAL HISTORY: Mary Leach 70 y.o. female returns to the clinic today for follow-up visit.  The patient is feeling fine today with no concerning complaints except for mild fatigue.  She had a positive stool for Hemoccult and she was recently evaluated by her gastroenterologist and was found to have gastritis as well as small colon polyps.  She started on Protonix.  She denied having any current chest pain, shortness of breath, cough or hemoptysis.  She denied having any nausea, vomiting, diarrhea or constipation.  She has no headache or visual changes.  She was referred back by her primary care physician for evaluation of her persistent anemia.  MEDICAL HISTORY: Past Medical History:  Diagnosis Date   Allergic rhinitis    Anxiety    Asthma    Bipolar disorder ([place])    Celiac disease    Depression    Elevated liver function tests 2019   Elevated testosterone level 2019   Elevated vitamin B12 level 2019   History of colonic polyps    Hypothyroidism    Ileus following gastrointestinal surgery ([place]) 10/18/2011   Leukopenia    Low TSH level 2019   Malnutrition following gastrointestinal surgery 10/18/2011   Osteoporosis 2018   RBBB (right bundle branch block)    Renal stone 2020    ALLERGIES:  is allergic to gluten meal, corn oil, soy allergy, augmentin [amoxicillin-pot clavulanate], chocolate, food, other, sulfa antibiotics, sulfonamide derivatives, and tape.  MEDICATIONS:  Current Outpatient Medications  Medication Sig Dispense Refill   ARMOUR THYROID 120 MG tablet Take 1 tablet by mouth daily.     cetirizine (ZYRTEC) 10 MG tablet Take 10 mg by mouth every evening.      cholecalciferol  (VITAMIN D3) 25 MCG (1000 UNIT) tablet Take 1,000 Units by mouth daily.     FLUoxetine (PROZAC) 10 MG capsule Take 3 capsules (30 mg total) by mouth daily. Takes 3 tablets in AM 270 capsule 3   magnesium gluconate (MAGONATE) 500 MG tablet Take 1,500 mg by mouth every evening.      Multiple Minerals-Vitamins (NUTRA-SUPPORT BONE) CAPS Take 1-2 tablets by mouth See admin instructions. Take 1 tablet every morning and 2 tablets every night     Multiple Vitamin (MULTIVITAMIN) tablet Take 1 tablet by mouth daily. PURE Encapsulations     pantoprazole (PROTONIX) 40 MG tablet Take 40 mg by mouth every morning.     Probiotic Product (PROBIOTIC DAILY PO) Take by mouth. metaflora IB     vitamin C (ASCORBIC ACID) 500 MG tablet Take 500 mg by mouth daily.     vitamin k 100 MCG tablet Take 100 mcg by mouth daily.     No current facility-administered medications for this visit.    SURGICAL HISTORY:  Past Surgical History:  Procedure Laterality Date   CATARACT EXTRACTION Bilateral 11/2019   COLON SURGERY     COLONOSCOPY W/ POLYPECTOMY     COLOSTOMY TAKEDOWN  05/29/2012   Procedure: LAPAROSCOPIC COLOSTOMY TAKEDOWN;  Surgeon: Odis Hollingshead, MD;  Location: [facility];  Service: General;  Laterality: N/A;  Laparoscopic Assisted Colostomy Closure  with proctoscopy   CYSTECTOMY     LEFT FOOT, RIGHT HAND   DILATATION & CURETTAGE/HYSTEROSCOPY WITH MYOSURE N/A 07/25/2018  Procedure: DILATATION & CURETTAGE/HYSTEROSCOPY WITH MYOSURE RESECTION OF ENDOMETRIAL AND ENDOCERVICAL POLYP;  Surgeon: Nunzio Cobbs, MD;  Location: [facility];  Service: Gynecology;  Laterality: N/A;   FOOT SURGERY     FOOT SURGERY Left 12/07/2018   cyst removed   LAPAROTOMY  10/07/2011   Procedure: EXPLORATORY LAPAROTOMY;  Surgeon: Odis Hollingshead, MD;  Location: [facility];  Service: General;  Laterality: N/A;  hartmann procedure and creation of colostomy   NASAL SEPTOPLASTY W/ TURBINOPLASTY Bilateral 11/11/2015    Procedure: SEPTOPLASTY BILATERAL TURBINATE  REDUCTION ;  Surgeon: Leta Baptist, MD;  Location: [place];  Service: ENT;  Laterality: Bilateral;   NASAL SINUS SURGERY Bilateral 11/11/2015   Procedure: BILATERAL ENDOSCOPIC  MAXILLARY ANTROSTOMY;  Surgeon: Leta Baptist, MD;  Location: [place];  Service: ENT;  Laterality: Bilateral;   SKIN BIOPSY Right 06/04/2019   basal cell carcinoma , nodular pattern   TONSILLECTOMY AND ADENOIDECTOMY     TUBAL LIGATION     UPPER GASTROINTESTINAL ENDOSCOPY  01/19/12    REVIEW OF SYSTEMS:  A comprehensive review of systems was negative except for: Constitutional: positive for fatigue   PHYSICAL EXAMINATION: General appearance: alert, cooperative, fatigued, and no distress Head: Normocephalic, without obvious abnormality, atraumatic Neck: no adenopathy, no JVD, supple, symmetrical, trachea midline, and thyroid not enlarged, symmetric, no tenderness/mass/nodules Lymph nodes: Cervical, supraclavicular, and axillary nodes normal. Resp: clear to auscultation bilaterally Back: symmetric, no curvature. ROM normal. No CVA tenderness. Cardio: regular rate and rhythm, S1, S2 normal, no murmur, click, rub or gallop GI: soft, non-tender; bowel sounds normal; no masses,  no organomegaly Extremities: extremities normal, atraumatic, no cyanosis or edema  ECOG PERFORMANCE STATUS: 1 - Symptomatic but completely ambulatory  Blood pressure 106/62, pulse 65, temperature 98.6 F (37 C), temperature source Tympanic, resp. rate 18, weight 126 lb 7 oz (57.4 kg), last menstrual period 05/24/2006, SpO2 100 %.  LABORATORY DATA: Lab Results  Component Value Date   WBC 2.4 (L) 10/06/2020   HGB 9.6 (L) 10/06/2020   HCT 28.5 (L) 10/06/2020   MCV 89.3 10/06/2020   PLT 216 10/06/2020      Chemistry      Component Value Date/Time   NA 134 (L) 10/06/2020 1500   NA 134 (A) 07/22/2018 0000   K 4.1 10/06/2020 1500   CL 102 10/06/2020 1500   CO2 27  10/06/2020 1500   BUN 16 10/06/2020 1500   BUN 24 (A) 07/22/2018 0000   CREATININE 0.72 10/06/2020 1500   CREATININE 0.57 12/26/2014 1423   GLU 92 07/22/2018 0000      Component Value Date/Time   CALCIUM 8.7 (L) 10/06/2020 1500   ALKPHOS 117 10/06/2020 1500   AST 25 10/06/2020 1500   ALT 21 10/06/2020 1500   BILITOT 0.2 (L) 10/06/2020 1500       RADIOGRAPHIC STUDIES: No results found.  ASSESSMENT AND PLAN: This is a very pleasant 70 years old white female with persistent leukocytopenia of unclear etiology.   The patient was found to have positive stool for Hemoccult but her gastrointestinal work-up including upper endoscopy and colonoscopy were unremarkable except for mild gastritis as well as a small colon polyp. The patient continues to have persistent anemia and she was referred back by her primary care physician for evaluation of her anemia and consideration of iron infusion if needed. I will repeat CBC, iron study and ferritin today.  If the patient has evidence for iron deficiency, I  will consider her for iron infusion, otherwise I will see her back for follow-up visit in 3 months for evaluation and repeat blood work. She was advised to call immediately if she has any concerning symptoms in the interval. The patient voices understanding of current disease status and treatment options and is in agreement with the current care plan.  All questions were answered. The patient knows to call the clinic with any problems, questions or concerns. We can certainly see the patient much sooner if necessary.  Disclaimer: This note was dictated with voice recognition software. Similar sounding words can inadvertently be transcribed and may not be corrected upon review.

## 2021-01-06 ENCOUNTER — Other Ambulatory Visit: Payer: Medicare Other

## 2021-01-06 ENCOUNTER — Ambulatory Visit: Payer: Medicare Other | Admitting: Internal Medicine

## 2021-01-20 ENCOUNTER — Ambulatory Visit: Payer: Medicare Other | Admitting: Obstetrics and Gynecology

## 2021-03-18 ENCOUNTER — Other Ambulatory Visit: Payer: Medicare Other

## 2021-03-18 ENCOUNTER — Ambulatory Visit: Payer: Medicare Other | Admitting: Internal Medicine

## 2021-03-25 ENCOUNTER — Other Ambulatory Visit: Payer: Self-pay | Admitting: Medical Oncology

## 2021-03-25 ENCOUNTER — Inpatient Hospital Stay: Payer: Medicare Other

## 2021-03-25 ENCOUNTER — Other Ambulatory Visit: Payer: Self-pay

## 2021-03-25 ENCOUNTER — Inpatient Hospital Stay: Payer: Medicare Other | Attending: Internal Medicine

## 2021-03-25 ENCOUNTER — Inpatient Hospital Stay: Payer: Medicare Other | Admitting: Internal Medicine

## 2021-03-25 ENCOUNTER — Encounter: Payer: Self-pay | Admitting: Internal Medicine

## 2021-03-25 VITALS — BP 123/54 | HR 78 | Temp 97.3°F | Resp 18 | Ht 63.5 in | Wt 122.4 lb

## 2021-03-25 DIAGNOSIS — R0609 Other forms of dyspnea: Secondary | ICD-10-CM | POA: Insufficient documentation

## 2021-03-25 DIAGNOSIS — Z880 Allergy status to penicillin: Secondary | ICD-10-CM | POA: Insufficient documentation

## 2021-03-25 DIAGNOSIS — Z882 Allergy status to sulfonamides status: Secondary | ICD-10-CM | POA: Diagnosis not present

## 2021-03-25 DIAGNOSIS — Z79899 Other long term (current) drug therapy: Secondary | ICD-10-CM | POA: Insufficient documentation

## 2021-03-25 DIAGNOSIS — Z8719 Personal history of other diseases of the digestive system: Secondary | ICD-10-CM | POA: Diagnosis not present

## 2021-03-25 DIAGNOSIS — D72819 Decreased white blood cell count, unspecified: Secondary | ICD-10-CM | POA: Diagnosis not present

## 2021-03-25 DIAGNOSIS — Z888 Allergy status to other drugs, medicaments and biological substances status: Secondary | ICD-10-CM | POA: Insufficient documentation

## 2021-03-25 DIAGNOSIS — D539 Nutritional anemia, unspecified: Secondary | ICD-10-CM

## 2021-03-25 DIAGNOSIS — Z87442 Personal history of urinary calculi: Secondary | ICD-10-CM | POA: Diagnosis not present

## 2021-03-25 DIAGNOSIS — R1013 Epigastric pain: Secondary | ICD-10-CM | POA: Diagnosis not present

## 2021-03-25 DIAGNOSIS — Z85828 Personal history of other malignant neoplasm of skin: Secondary | ICD-10-CM | POA: Insufficient documentation

## 2021-03-25 DIAGNOSIS — D509 Iron deficiency anemia, unspecified: Secondary | ICD-10-CM | POA: Diagnosis present

## 2021-03-25 DIAGNOSIS — R5383 Other fatigue: Secondary | ICD-10-CM | POA: Insufficient documentation

## 2021-03-25 DIAGNOSIS — Z881 Allergy status to other antibiotic agents status: Secondary | ICD-10-CM | POA: Diagnosis not present

## 2021-03-25 DIAGNOSIS — D61818 Other pancytopenia: Secondary | ICD-10-CM | POA: Insufficient documentation

## 2021-03-25 LAB — CBC WITH DIFFERENTIAL (CANCER CENTER ONLY)
Abs Immature Granulocytes: 0.01 10*3/uL (ref 0.00–0.07)
Basophils Absolute: 0 10*3/uL (ref 0.0–0.1)
Basophils Relative: 1 %
Eosinophils Absolute: 0.1 10*3/uL (ref 0.0–0.5)
Eosinophils Relative: 4 %
HCT: 20.3 % — ABNORMAL LOW (ref 36.0–46.0)
Hemoglobin: 5.9 g/dL — CL (ref 12.0–15.0)
Immature Granulocytes: 1 %
Lymphocytes Relative: 18 %
Lymphs Abs: 0.4 10*3/uL — ABNORMAL LOW (ref 0.7–4.0)
MCH: 19.6 pg — ABNORMAL LOW (ref 26.0–34.0)
MCHC: 29.1 g/dL — ABNORMAL LOW (ref 30.0–36.0)
MCV: 67.4 fL — ABNORMAL LOW (ref 80.0–100.0)
Monocytes Absolute: 0.3 10*3/uL (ref 0.1–1.0)
Monocytes Relative: 17 %
Neutro Abs: 1.2 10*3/uL — ABNORMAL LOW (ref 1.7–7.7)
Neutrophils Relative %: 59 %
Platelet Count: 345 10*3/uL (ref 150–400)
RBC: 3.01 MIL/uL — ABNORMAL LOW (ref 3.87–5.11)
RDW: 18.2 % — ABNORMAL HIGH (ref 11.5–15.5)
WBC Count: 2 10*3/uL — ABNORMAL LOW (ref 4.0–10.5)
nRBC: 0 % (ref 0.0–0.2)

## 2021-03-25 LAB — PREPARE RBC (CROSSMATCH)

## 2021-03-25 LAB — IRON AND TIBC
Iron: 14 ug/dL — ABNORMAL LOW (ref 41–142)
Saturation Ratios: 3 % — ABNORMAL LOW (ref 21–57)
TIBC: 488 ug/dL — ABNORMAL HIGH (ref 236–444)
UIBC: 474 ug/dL — ABNORMAL HIGH (ref 120–384)

## 2021-03-25 LAB — LACTATE DEHYDROGENASE: LDH: 165 U/L (ref 98–192)

## 2021-03-25 LAB — FERRITIN: Ferritin: <4 ng/mL — ABNORMAL LOW (ref 11–307)

## 2021-03-25 MED ORDER — SODIUM CHLORIDE 0.9% IV SOLUTION
250.0000 mL | Freq: Once | INTRAVENOUS | Status: AC
  Administered 2021-03-25: 250 mL via INTRAVENOUS

## 2021-03-25 MED ORDER — DIPHENHYDRAMINE HCL 25 MG PO CAPS
25.0000 mg | ORAL_CAPSULE | Freq: Once | ORAL | Status: AC
  Administered 2021-03-25: 25 mg via ORAL
  Filled 2021-03-25: qty 1

## 2021-03-25 MED ORDER — ACETAMINOPHEN 325 MG PO TABS
650.0000 mg | ORAL_TABLET | Freq: Once | ORAL | Status: AC
  Administered 2021-03-25: 650 mg via ORAL
  Filled 2021-03-25: qty 2

## 2021-03-25 NOTE — Patient Instructions (Signed)
Blood Transfusion, Adult, Care After This sheet gives you information about how to care for yourself after your procedure. Your doctor may also give you more specific instructions. If you have problems or questions, contact your doctor. What can I expect after the procedure? After the procedure, it is common to have: Bruising and soreness at the IV site. A headache. Follow these instructions at home: Insertion site care   Follow instructions from your doctor about how to take care of your insertion site. This is where an IV tube was put into your vein. Make sure you: Wash your hands with soap and water before and after you change your bandage (dressing). If you cannot use soap and water, use hand sanitizer. Change your bandage as told by your doctor. Check your insertion site every day for signs of infection. Check for: Redness, swelling, or pain. Bleeding from the site. Warmth. Pus or a bad smell. General instructions Take over-the-counter and prescription medicines only as told by your doctor. Rest as told by your doctor. Go back to your normal activities as told by your doctor. Keep all follow-up visits as told by your doctor. This is important. Contact a doctor if: You have itching or red, swollen areas of skin (hives). You feel worried or nervous (anxious). You feel weak after doing your normal activities. You have redness, swelling, warmth, or pain around the insertion site. You have blood coming from the insertion site, and the blood does not stop with pressure. You have pus or a bad smell coming from the insertion site. Get help right away if: You have signs of a serious reaction. This may be coming from an allergy or the body's defense system (immune system). Signs include: Trouble breathing or shortness of breath. Swelling of the face or feeling warm (flushed). Fever or chills. Head, chest, or back pain. Dark pee (urine) or blood in the pee. Widespread rash. Fast  heartbeat. Feeling dizzy or light-headed. You may receive your blood transfusion in an outpatient setting. If so, you will be told whom to contact to report any reactions. These symptoms may be an emergency. Do not wait to see if the symptoms will go away. Get medical help right away. Call your local emergency services (911 in the [place] Do not drive yourself to the hospital. Summary Bruising and soreness at the IV site are common. Check your insertion site every day for signs of infection. Rest as told by your doctor. Go back to your normal activities as told by your doctor. Get help right away if you have signs of a serious reaction. This information is not intended to replace advice given to you by your health care provider. Make sure you discuss any questions you have with your health care provider. Document Revised: 09/04/2020 Document Reviewed: 11/02/2018 Elsevier Patient Education  2022 [employer]  

## 2021-03-25 NOTE — Progress Notes (Signed)
Per Dr Julien Nordmann , it is okay to transfuse blood at 300/ml hour.

## 2021-03-25 NOTE — Progress Notes (Signed)
[place] [phone]) [phone]   Fax:(336) [phone]  OFFICE PROGRESS NOTE  Pendergraft, Darrick Penna, MD No address on file  DIAGNOSIS:  1) Pancytopenia of unclear etiology diagnosed in October 2020. 2) iron deficiency anemia  PRIOR THERAPY: None  CURRENT THERAPY: Observation.  INTERVAL HISTORY: Mary Leach 70 y.o. female returns to the clinic today for follow-up visit accompanied by her husband.  The patient continues to complain of mild fatigue with no significant chest pain, shortness of breath, cough or hemoptysis.  She denied having any nausea, vomiting, diarrhea or constipation but continues to have epigastric pain.  She was seen by Dr. Collene Mares a year ago for similar problem and she had upper endoscopy and colonoscopy.  The patient has no fever or chills.  She has no recent weight loss or night sweats.  She is here today for evaluation and repeat blood work.  MEDICAL HISTORY: Past Medical History:  Diagnosis Date   Allergic rhinitis    Anxiety    Asthma    Bipolar disorder ([place])    Celiac disease    Depression    Elevated liver function tests 2019   Elevated testosterone level 2019   Elevated vitamin B12 level 2019   History of colonic polyps    Hypothyroidism    Ileus following gastrointestinal surgery ([place]) 10/18/2011   Leukopenia    Low TSH level 2019   Malnutrition following gastrointestinal surgery 10/18/2011   Osteoporosis 2018   RBBB (right bundle branch block)    Renal stone 2020    ALLERGIES:  is allergic to gluten meal, corn oil, soy allergy, augmentin [amoxicillin-pot clavulanate], chocolate, food, other, sulfa antibiotics, sulfonamide derivatives, and tape.  MEDICATIONS:  Current Outpatient Medications  Medication Sig Dispense Refill   ARMOUR THYROID 120 MG tablet Take 1 tablet by mouth daily.     cetirizine (ZYRTEC) 10 MG tablet Take 10 mg by mouth every evening.      cholecalciferol (VITAMIN D3) 25 MCG (1000 UNIT) tablet  Take 1,000 Units by mouth daily. Per pt, takes "Synergy K" which has Vit D3. Unable to locate this medication.     FLUoxetine (PROZAC) 10 MG capsule Take 3 capsules (30 mg total) by mouth daily. Takes 3 tablets in AM 270 capsule 3   magnesium gluconate (MAGONATE) 500 MG tablet Take 1,500 mg by mouth every evening.      Multiple Minerals-Vitamins (NUTRA-SUPPORT BONE) CAPS Take 1-2 tablets by mouth See admin instructions. Take 1 tablet every morning and 2 tablets every night     Multiple Vitamin (MULTIVITAMIN) tablet Take 1 tablet by mouth daily. PURE Encapsulations     pantoprazole (PROTONIX) 40 MG tablet Take 40 mg by mouth every morning.     Probiotic Product (PROBIOTIC DAILY PO) Take by mouth. metaflora IB     vitamin C (ASCORBIC ACID) 500 MG tablet Take 500 mg by mouth daily.     vitamin k 100 MCG tablet Take 100 mcg by mouth daily.     No current facility-administered medications for this visit.    SURGICAL HISTORY:  Past Surgical History:  Procedure Laterality Date   CATARACT EXTRACTION Bilateral 11/2019   COLON SURGERY     COLONOSCOPY W/ POLYPECTOMY     COLOSTOMY TAKEDOWN  05/29/2012   Procedure: LAPAROSCOPIC COLOSTOMY TAKEDOWN;  Surgeon: Odis Hollingshead, MD;  Location: [facility];  Service: General;  Laterality: N/A;  Laparoscopic Assisted Colostomy Closure  with proctoscopy   CYSTECTOMY  LEFT FOOT, RIGHT HAND   DILATATION & CURETTAGE/HYSTEROSCOPY WITH MYOSURE N/A 07/25/2018   Procedure: DILATATION & CURETTAGE/HYSTEROSCOPY WITH MYOSURE RESECTION OF ENDOMETRIAL AND ENDOCERVICAL POLYP;  Surgeon: Nunzio Cobbs, MD;  Location: [facility];  Service: Gynecology;  Laterality: N/A;   FOOT SURGERY     FOOT SURGERY Left 12/07/2018   cyst removed   LAPAROTOMY  10/07/2011   Procedure: EXPLORATORY LAPAROTOMY;  Surgeon: Odis Hollingshead, MD;  Location: [facility];  Service: General;  Laterality: N/A;  hartmann procedure and creation of colostomy   NASAL SEPTOPLASTY W/  TURBINOPLASTY Bilateral 11/11/2015   Procedure: SEPTOPLASTY BILATERAL TURBINATE  REDUCTION ;  Surgeon: Leta Baptist, MD;  Location: [facility];  Service: ENT;  Laterality: Bilateral;   NASAL SINUS SURGERY Bilateral 11/11/2015   Procedure: BILATERAL ENDOSCOPIC  MAXILLARY ANTROSTOMY;  Surgeon: Leta Baptist, MD;  Location: [facility];  Service: ENT;  Laterality: Bilateral;   SKIN BIOPSY Right 06/04/2019   basal cell carcinoma , nodular pattern   TONSILLECTOMY AND ADENOIDECTOMY     TUBAL LIGATION     UPPER GASTROINTESTINAL ENDOSCOPY  01/19/12    REVIEW OF SYSTEMS:  Constitutional: positive for fatigue Eyes: negative Ears, nose, mouth, throat, and face: negative Respiratory: positive for dyspnea on exertion Cardiovascular: negative Gastrointestinal: negative Genitourinary:negative Integument/breast: negative Hematologic/lymphatic: negative Musculoskeletal:negative Neurological: negative Behavioral/Psych: negative Endocrine: negative Allergic/Immunologic: negative   PHYSICAL EXAMINATION: General appearance: alert, cooperative, fatigued, and no distress Head: Normocephalic, without obvious abnormality, atraumatic Neck: no adenopathy, no JVD, supple, symmetrical, trachea midline, and thyroid not enlarged, symmetric, no tenderness/mass/nodules Lymph nodes: Cervical, supraclavicular, and axillary nodes normal. Resp: clear to auscultation bilaterally Back: symmetric, no curvature. ROM normal. No CVA tenderness. Cardio: regular rate and rhythm, S1, S2 normal, no murmur, click, rub or gallop GI: soft, non-tender; bowel sounds normal; no masses,  no organomegaly Extremities: extremities normal, atraumatic, no cyanosis or edema Neurologic: Alert and oriented X 3, normal strength and tone. Normal symmetric reflexes. Normal coordination and gait  ECOG PERFORMANCE STATUS: 1 - Symptomatic but completely ambulatory  Blood pressure (!) 123/54, pulse 78, temperature (!) 97.3 F  (36.3 C), temperature source Tympanic, resp. rate 18, height 5' 3.5" (1.613 m), weight 122 lb 6.4 oz (55.5 kg), last menstrual period 05/24/2006, SpO2 100 %.  LABORATORY DATA: Lab Results  Component Value Date   WBC 2.0 (L) 03/25/2021   HGB 5.9 (LL) 03/25/2021   HCT 20.3 (L) 03/25/2021   MCV 67.4 (L) 03/25/2021   PLT 345 03/25/2021      Chemistry      Component Value Date/Time   NA 134 (L) 10/06/2020 1500   NA 134 (A) 07/22/2018 0000   K 4.1 10/06/2020 1500   CL 102 10/06/2020 1500   CO2 27 10/06/2020 1500   BUN 16 10/06/2020 1500   BUN 24 (A) 07/22/2018 0000   CREATININE 0.72 10/06/2020 1500   CREATININE 0.57 12/26/2014 1423   GLU 92 07/22/2018 0000      Component Value Date/Time   CALCIUM 8.7 (L) 10/06/2020 1500   ALKPHOS 117 10/06/2020 1500   AST 25 10/06/2020 1500   ALT 21 10/06/2020 1500   BILITOT 0.2 (L) 10/06/2020 1500       RADIOGRAPHIC STUDIES: No results found.  ASSESSMENT AND PLAN: This is a very pleasant 70 years old white female with persistent leukocytopenia of unclear etiology.   The patient was found to have positive stool for Hemoccult but her gastrointestinal work-up including upper  endoscopy and colonoscopy were unremarkable except for mild gastritis as well as a small colon polyp. The patient presented to the clinic today with fatigue and her hemoglobin was down to 5.9 Iron study and ferritin showed significant iron deficiency. I will arrange for the patient to receive 2 units of PRBCs transfusion today. I will also arrange for the patient to receive iron infusion with Venofer 300 mg IV weekly for 3 weeks at the [place] infusion center. I will see the patient back for follow-up visit in 1 months for evaluation and repeat blood work.  She was also advised to see Dr. Collene Mares for reevaluation of her condition and to rule out any underlying gastrointestinal bleeding which is likely with her current condition. The patient was advised to call immediately  if she has any other concerning symptoms in the interval. The patient voices understanding of current disease status and treatment options and is in agreement with the current care plan.  All questions were answered. The patient knows to call the clinic with any problems, questions or concerns. We can certainly see the patient much sooner if necessary.  Disclaimer: This note was dictated with voice recognition software. Similar sounding words can inadvertently be transcribed and may not be corrected upon review.

## 2021-03-26 ENCOUNTER — Encounter: Payer: Self-pay | Admitting: Internal Medicine

## 2021-03-26 ENCOUNTER — Other Ambulatory Visit: Payer: Self-pay

## 2021-03-26 LAB — TYPE AND SCREEN
ABO/RH(D): O POS
Antibody Screen: NEGATIVE
Unit division: 0
Unit division: 0

## 2021-03-26 LAB — BPAM RBC
Blood Product Expiration Date: 202211282359
Blood Product Expiration Date: 202212012359
ISSUE DATE / TIME: 202211021334
ISSUE DATE / TIME: 202211021334
Unit Type and Rh: 5100
Unit Type and Rh: 5100

## 2021-03-30 ENCOUNTER — Ambulatory Visit (INDEPENDENT_AMBULATORY_CARE_PROVIDER_SITE_OTHER): Payer: Medicare Other

## 2021-03-30 ENCOUNTER — Other Ambulatory Visit: Payer: Self-pay

## 2021-03-30 VITALS — BP 121/75 | HR 65 | Temp 98.5°F | Resp 16 | Ht 63.0 in | Wt 119.4 lb

## 2021-03-30 DIAGNOSIS — D539 Nutritional anemia, unspecified: Secondary | ICD-10-CM

## 2021-03-30 MED ORDER — EPINEPHRINE 0.3 MG/0.3ML IJ SOAJ: 0.3000 mg | Freq: Once | INTRAMUSCULAR | Status: DC | PRN

## 2021-03-30 MED ORDER — ACETAMINOPHEN 325 MG PO TABS
650.0000 mg | ORAL_TABLET | Freq: Once | ORAL | Status: AC
  Administered 2021-03-30: 650 mg via ORAL

## 2021-03-30 MED ORDER — DIPHENHYDRAMINE HCL 50 MG/ML IJ SOLN: 50.0000 mg | Freq: Once | INTRAMUSCULAR | Status: DC | PRN

## 2021-03-30 MED ORDER — METHYLPREDNISOLONE SODIUM SUCC 125 MG IJ SOLR: 125.0000 mg | Freq: Once | INTRAMUSCULAR | Status: DC | PRN

## 2021-03-30 MED ORDER — ALBUTEROL SULFATE HFA 108 (90 BASE) MCG/ACT IN AERS: 2.0000 | INHALATION_SPRAY | Freq: Once | RESPIRATORY_TRACT | Status: DC | PRN

## 2021-03-30 MED ORDER — DIPHENHYDRAMINE HCL 25 MG PO CAPS
25.0000 mg | ORAL_CAPSULE | Freq: Once | ORAL | Status: AC
  Administered 2021-03-30: 25 mg via ORAL

## 2021-03-30 MED ORDER — SODIUM CHLORIDE 0.9 % IV SOLN: Freq: Once | INTRAVENOUS | Status: DC | PRN

## 2021-03-30 MED ORDER — SODIUM CHLORIDE 0.9 % IV SOLN
300.0000 mg | INTRAVENOUS | Status: DC
  Administered 2021-03-30: 300 mg via INTRAVENOUS
  Filled 2021-03-30: qty 15

## 2021-03-30 MED ORDER — FAMOTIDINE IN NACL 20-0.9 MG/50ML-% IV SOLN: 20.0000 mg | Freq: Once | INTRAVENOUS | Status: DC | PRN

## 2021-03-30 NOTE — Progress Notes (Signed)
Diagnosis: Iron Deficiency Anemia  Provider:  Marshell Garfinkel, MD  Procedure: Infusion  IV Type: Peripheral, IV Location: R Hand  Venofer (Iron Sucrose), Dose: 300 mg  Infusion Start Time: 2800  Infusion Stop Time: 3491  Post Infusion IV Care: Observation period completed and Peripheral IV Discontinued  Discharge: Condition: Good, Destination: Home . AVS provided to patient.   Performed by:  Cassidy Tashiro, Sherlon Handing, LPN

## 2021-04-01 ENCOUNTER — Telehealth: Payer: Self-pay | Admitting: Internal Medicine

## 2021-04-01 NOTE — Telephone Encounter (Signed)
Sch per 11 inbasket,ptaware

## 2021-04-06 ENCOUNTER — Other Ambulatory Visit: Payer: Self-pay

## 2021-04-06 ENCOUNTER — Ambulatory Visit (INDEPENDENT_AMBULATORY_CARE_PROVIDER_SITE_OTHER): Payer: Medicare Other

## 2021-04-06 VITALS — BP 125/76 | HR 63 | Temp 98.5°F | Resp 16 | Ht 63.75 in | Wt 122.0 lb

## 2021-04-06 DIAGNOSIS — D539 Nutritional anemia, unspecified: Secondary | ICD-10-CM

## 2021-04-06 MED ORDER — DIPHENHYDRAMINE HCL 25 MG PO CAPS
25.0000 mg | ORAL_CAPSULE | Freq: Once | ORAL | Status: AC
  Administered 2021-04-06: 25 mg via ORAL

## 2021-04-06 MED ORDER — SODIUM CHLORIDE 0.9 % IV SOLN
300.0000 mg | INTRAVENOUS | Status: DC
  Administered 2021-04-06: 300 mg via INTRAVENOUS
  Filled 2021-04-06: qty 15

## 2021-04-06 MED ORDER — ALBUTEROL SULFATE HFA 108 (90 BASE) MCG/ACT IN AERS: 2.0000 | INHALATION_SPRAY | Freq: Once | RESPIRATORY_TRACT | Status: DC | PRN

## 2021-04-06 MED ORDER — DIPHENHYDRAMINE HCL 50 MG/ML IJ SOLN: 50.0000 mg | Freq: Once | INTRAMUSCULAR | Status: DC | PRN

## 2021-04-06 MED ORDER — EPINEPHRINE 0.3 MG/0.3ML IJ SOAJ: 0.3000 mg | Freq: Once | INTRAMUSCULAR | Status: DC | PRN

## 2021-04-06 MED ORDER — METHYLPREDNISOLONE SODIUM SUCC 125 MG IJ SOLR: 125.0000 mg | Freq: Once | INTRAMUSCULAR | Status: DC | PRN

## 2021-04-06 MED ORDER — ACETAMINOPHEN 325 MG PO TABS
650.0000 mg | ORAL_TABLET | Freq: Once | ORAL | Status: AC
  Administered 2021-04-06: 650 mg via ORAL

## 2021-04-06 MED ORDER — SODIUM CHLORIDE 0.9 % IV SOLN: Freq: Once | INTRAVENOUS | Status: DC | PRN

## 2021-04-06 MED ORDER — FAMOTIDINE IN NACL 20-0.9 MG/50ML-% IV SOLN: 20.0000 mg | Freq: Once | INTRAVENOUS | Status: DC | PRN

## 2021-04-06 NOTE — Progress Notes (Signed)
Diagnosis: Iron Deficiency Anemia  Provider:  Marshell Garfinkel, MD  Procedure: Infusion  IV Type: Peripheral, IV Location: R Hand  Venofer (Iron Sucrose), Dose: 300 mg  Infusion Start Time: 12.15 04/06/2021  Infusion Stop Time: 13.55 04/06/2021  Post Infusion IV Care: Peripheral IV Discontinued  Discharge: Condition: Good, Destination: Home . AVS provided to patient.   Performed by:  Arnoldo Morale, RN

## 2021-04-08 ENCOUNTER — Other Ambulatory Visit: Payer: Self-pay | Admitting: Gastroenterology

## 2021-04-08 DIAGNOSIS — R634 Abnormal weight loss: Secondary | ICD-10-CM

## 2021-04-08 DIAGNOSIS — R1013 Epigastric pain: Secondary | ICD-10-CM

## 2021-04-09 ENCOUNTER — Encounter (HOSPITAL_COMMUNITY): Admission: RE | Payer: Self-pay | Source: Ambulatory Visit

## 2021-04-09 ENCOUNTER — Ambulatory Visit (HOSPITAL_COMMUNITY): Admission: RE | Admit: 2021-04-09 | Payer: Medicare Other | Source: Ambulatory Visit | Admitting: Gastroenterology

## 2021-04-09 SURGERY — AGILE CAPSULE

## 2021-04-13 ENCOUNTER — Other Ambulatory Visit: Payer: Self-pay

## 2021-04-13 ENCOUNTER — Ambulatory Visit (INDEPENDENT_AMBULATORY_CARE_PROVIDER_SITE_OTHER): Payer: Medicare Other

## 2021-04-13 VITALS — BP 126/65 | HR 64 | Temp 98.8°F | Resp 16 | Ht 65.0 in | Wt 122.0 lb

## 2021-04-13 DIAGNOSIS — D539 Nutritional anemia, unspecified: Secondary | ICD-10-CM | POA: Diagnosis not present

## 2021-04-13 MED ORDER — DIPHENHYDRAMINE HCL 25 MG PO CAPS
25.0000 mg | ORAL_CAPSULE | Freq: Once | ORAL | Status: AC
  Administered 2021-04-13: 25 mg via ORAL

## 2021-04-13 MED ORDER — ACETAMINOPHEN 325 MG PO TABS
650.0000 mg | ORAL_TABLET | Freq: Once | ORAL | Status: AC
  Administered 2021-04-13: 650 mg via ORAL

## 2021-04-13 MED ORDER — SODIUM CHLORIDE 0.9 % IV SOLN
300.0000 mg | INTRAVENOUS | Status: DC
  Administered 2021-04-13: 300 mg via INTRAVENOUS
  Filled 2021-04-13: qty 15

## 2021-04-13 NOTE — Progress Notes (Signed)
Diagnosis: Iron Deficiency Anemia  Provider:  Marshell Garfinkel, MD  Procedure: Infusion  IV Type: Peripheral, IV Location: R Forearm  Venofer (Iron Sucrose), Dose: 300 mg  Infusion Start Time: 11.53  Infusion Stop Time: 1610  Post Infusion IV Care: Peripheral IV Discontinued  Discharge: Condition: Good, Destination: Home . AVS provided to patient.   Performed by:  Channie Bostick, Sherlon Handing, LPN

## 2021-04-22 ENCOUNTER — Inpatient Hospital Stay: Payer: Medicare Other

## 2021-04-22 ENCOUNTER — Other Ambulatory Visit: Payer: Self-pay

## 2021-04-22 ENCOUNTER — Encounter: Payer: Self-pay | Admitting: Internal Medicine

## 2021-04-22 ENCOUNTER — Inpatient Hospital Stay: Payer: Medicare Other | Admitting: Internal Medicine

## 2021-04-22 VITALS — BP 130/59 | HR 74 | Temp 97.5°F | Resp 18 | Ht 65.0 in | Wt 122.9 lb

## 2021-04-22 DIAGNOSIS — D539 Nutritional anemia, unspecified: Secondary | ICD-10-CM | POA: Diagnosis not present

## 2021-04-22 DIAGNOSIS — D61818 Other pancytopenia: Secondary | ICD-10-CM | POA: Diagnosis not present

## 2021-04-22 LAB — IRON AND TIBC
Iron: 47 ug/dL (ref 28–170)
Saturation Ratios: 12 % (ref 10.4–31.8)
TIBC: 378 ug/dL (ref 250–450)
UIBC: 331 ug/dL

## 2021-04-22 LAB — CBC WITH DIFFERENTIAL (CANCER CENTER ONLY)
Abs Immature Granulocytes: 0 10*3/uL (ref 0.00–0.07)
Basophils Absolute: 0 10*3/uL (ref 0.0–0.1)
Basophils Relative: 1 %
Eosinophils Absolute: 0.1 10*3/uL (ref 0.0–0.5)
Eosinophils Relative: 5 %
HCT: 31.4 % — ABNORMAL LOW (ref 36.0–46.0)
Hemoglobin: 9.6 g/dL — ABNORMAL LOW (ref 12.0–15.0)
Immature Granulocytes: 0 %
Lymphocytes Relative: 15 %
Lymphs Abs: 0.3 10*3/uL — ABNORMAL LOW (ref 0.7–4.0)
MCH: 24.8 pg — ABNORMAL LOW (ref 26.0–34.0)
MCHC: 30.6 g/dL (ref 30.0–36.0)
MCV: 81.1 fL (ref 80.0–100.0)
Monocytes Absolute: 0.2 10*3/uL (ref 0.1–1.0)
Monocytes Relative: 12 %
Neutro Abs: 1.4 10*3/uL — ABNORMAL LOW (ref 1.7–7.7)
Neutrophils Relative %: 67 %
Platelet Count: 205 10*3/uL (ref 150–400)
RBC: 3.87 MIL/uL (ref 3.87–5.11)
WBC Count: 2 10*3/uL — ABNORMAL LOW (ref 4.0–10.5)
nRBC: 0 % (ref 0.0–0.2)

## 2021-04-22 LAB — FERRITIN: Ferritin: 197 ng/mL (ref 11–307)

## 2021-04-22 NOTE — Progress Notes (Signed)
[place] Telephone:(336) [phone]   Fax:(336) [phone]  OFFICE PROGRESS NOTE  [address], [address] [place] [address]  DIAGNOSIS:  1) Pancytopenia of unclear etiology diagnosed in October 2020. 2) iron deficiency anemia  PRIOR THERAPY: None  CURRENT THERAPY: Observation.  INTERVAL HISTORY: Mary Leach 70 y.o. female returns to the clinic today for follow-up visit accompanied by her husband.  The patient is feeling much better after receiving blood transfusion as well as iron infusion few weeks ago.  She was seen by Dr. Collene Mares and expected to have barium study for evaluation of her persistent gastrointestinal bleeding.  She denied having any current chest pain, shortness of breath, cough or hemoptysis.  She denied having any fever or chills.  She has no nausea, vomiting, diarrhea or constipation.  She has no headache or visual changes.  She is here today for evaluation and repeat CBC, iron study and ferritin.   MEDICAL HISTORY: Past Medical History:  Diagnosis Date   Allergic rhinitis    Anxiety    Asthma    Bipolar disorder ([place])    Celiac disease    Depression    Elevated liver function tests 2019   Elevated testosterone level 2019   Elevated vitamin B12 level 2019   History of colonic polyps    Hypothyroidism    Ileus following gastrointestinal surgery ([place]) 10/18/2011   Leukopenia    Low TSH level 2019   Malnutrition following gastrointestinal surgery 10/18/2011   Osteoporosis 2018   RBBB (right bundle branch block)    Renal stone 2020    ALLERGIES:  is allergic to food, gluten meal, corn oil, soy allergy, augmentin [amoxicillin-pot clavulanate], chocolate, other, sulfa antibiotics, sulfonamide derivatives, and tape.  MEDICATIONS:  Current Outpatient Medications  Medication Sig Dispense Refill   ARMOUR THYROID 120 MG tablet Take 1 tablet by mouth daily.     cetirizine (ZYRTEC) 10 MG tablet Take 10 mg by  mouth every evening.      cholecalciferol (VITAMIN D3) 25 MCG (1000 UNIT) tablet Take 1,000 Units by mouth daily. Per pt, takes "Synergy K" which has Vit D3. Unable to locate this medication.     FLUoxetine (PROZAC) 10 MG capsule Take 3 capsules (30 mg total) by mouth daily. Takes 3 tablets in AM 270 capsule 3   magnesium gluconate (MAGONATE) 500 MG tablet Take 1,500 mg by mouth every evening.      Multiple Minerals-Vitamins (NUTRA-SUPPORT BONE) CAPS Take 1-2 tablets by mouth See admin instructions. Take 1 tablet every morning and 2 tablets every night     Multiple Vitamin (MULTIVITAMIN) tablet Take 1 tablet by mouth daily. PURE Encapsulations     pantoprazole (PROTONIX) 40 MG tablet Take 40 mg by mouth every morning.     Probiotic Product (PROBIOTIC DAILY PO) Take by mouth. metaflora IB     vitamin C (ASCORBIC ACID) 500 MG tablet Take 500 mg by mouth daily.     No current facility-administered medications for this visit.    SURGICAL HISTORY:  Past Surgical History:  Procedure Laterality Date   CATARACT EXTRACTION Bilateral 11/2019   COLON SURGERY     COLONOSCOPY W/ POLYPECTOMY     COLOSTOMY TAKEDOWN  05/29/2012   Procedure: LAPAROSCOPIC COLOSTOMY TAKEDOWN;  Surgeon: Odis Hollingshead, MD;  Location: [facility];  Service: General;  Laterality: N/A;  Laparoscopic Assisted Colostomy Closure  with proctoscopy   CYSTECTOMY     LEFT FOOT, RIGHT  HAND   DILATATION & CURETTAGE/HYSTEROSCOPY WITH MYOSURE N/A 07/25/2018   Procedure: DILATATION & CURETTAGE/HYSTEROSCOPY WITH MYOSURE RESECTION OF ENDOMETRIAL AND ENDOCERVICAL POLYP;  Surgeon: Nunzio Cobbs, MD;  Location: [facility];  Service: Gynecology;  Laterality: N/A;   FOOT SURGERY     FOOT SURGERY Left 12/07/2018   cyst removed   LAPAROTOMY  10/07/2011   Procedure: EXPLORATORY LAPAROTOMY;  Surgeon: Odis Hollingshead, MD;  Location: [facility];  Service: General;  Laterality: N/A;  hartmann procedure and creation of colostomy    NASAL SEPTOPLASTY W/ TURBINOPLASTY Bilateral 11/11/2015   Procedure: SEPTOPLASTY BILATERAL TURBINATE  REDUCTION ;  Surgeon: Leta Baptist, MD;  Location: [facility];  Service: ENT;  Laterality: Bilateral;   NASAL SINUS SURGERY Bilateral 11/11/2015   Procedure: BILATERAL ENDOSCOPIC  MAXILLARY ANTROSTOMY;  Surgeon: Leta Baptist, MD;  Location: [facility];  Service: ENT;  Laterality: Bilateral;   SKIN BIOPSY Right 06/04/2019   basal cell carcinoma , nodular pattern   TONSILLECTOMY AND ADENOIDECTOMY     TUBAL LIGATION     UPPER GASTROINTESTINAL ENDOSCOPY  01/19/12    REVIEW OF SYSTEMS:  A comprehensive review of systems was negative except for: Constitutional: positive for fatigue   PHYSICAL EXAMINATION: General appearance: alert, cooperative, fatigued, and no distress Head: Normocephalic, without obvious abnormality, atraumatic Neck: no adenopathy, no JVD, supple, symmetrical, trachea midline, and thyroid not enlarged, symmetric, no tenderness/mass/nodules Lymph nodes: Cervical, supraclavicular, and axillary nodes normal. Resp: clear to auscultation bilaterally Back: symmetric, no curvature. ROM normal. No CVA tenderness. Cardio: regular rate and rhythm, S1, S2 normal, no murmur, click, rub or gallop GI: soft, non-tender; bowel sounds normal; no masses,  no organomegaly Extremities: extremities normal, atraumatic, no cyanosis or edema  ECOG PERFORMANCE STATUS: 1 - Symptomatic but completely ambulatory  Blood pressure (!) 130/59, pulse 74, temperature (!) 97.5 F (36.4 C), temperature source Tympanic, resp. rate 18, height 5' 5"  (1.651 m), weight 122 lb 14.4 oz (55.7 kg), last menstrual period 05/24/2006, SpO2 100 %.  LABORATORY DATA: Lab Results  Component Value Date   WBC 2.0 (L) 04/22/2021   HGB 9.6 (L) 04/22/2021   HCT 31.4 (L) 04/22/2021   MCV 81.1 04/22/2021   PLT 205 04/22/2021      Chemistry      Component Value Date/Time   NA 134 (L) 10/06/2020 1500    NA 134 (A) 07/22/2018 0000   K 4.1 10/06/2020 1500   CL 102 10/06/2020 1500   CO2 27 10/06/2020 1500   BUN 16 10/06/2020 1500   BUN 24 (A) 07/22/2018 0000   CREATININE 0.72 10/06/2020 1500   CREATININE 0.57 12/26/2014 1423   GLU 92 07/22/2018 0000      Component Value Date/Time   CALCIUM 8.7 (L) 10/06/2020 1500   ALKPHOS 117 10/06/2020 1500   AST 25 10/06/2020 1500   ALT 21 10/06/2020 1500   BILITOT 0.2 (L) 10/06/2020 1500       RADIOGRAPHIC STUDIES: No results found.  ASSESSMENT AND PLAN: This is a very pleasant 70 years old white female with persistent leukocytopenia of unclear etiology.   The patient was found to have positive stool for Hemoccult but her gastrointestinal work-up including upper endoscopy and colonoscopy were unremarkable except for mild gastritis as well as a small colon polyp. She is currently followed by Dr. Collene Mares for her gastrointestinal bleeding. She received PRBCs transfusion as well as iron infusion few weeks ago and she is feeling much better.  I recommended for the patient to continue on observation for now.  Her iron study and ferritin are still pending. CBC today showed improvement of her hemoglobin and hematocrit but she still have persistent anemia. I will see her back for follow-up visit in 2 months for evaluation and repeat CBC, iron study and ferritin. The patient was advised to call immediately if she has any other concerning symptoms in the interval. The patient voices understanding of current disease status and treatment options and is in agreement with the current care plan.  All questions were answered. The patient knows to call the clinic with any problems, questions or concerns. We can certainly see the patient much sooner if necessary.  Disclaimer: This note was dictated with voice recognition software. Similar sounding words can inadvertently be transcribed and may not be corrected upon review.

## 2021-04-23 DIAGNOSIS — R109 Unspecified abdominal pain: Secondary | ICD-10-CM | POA: Insufficient documentation

## 2021-04-23 DIAGNOSIS — D509 Iron deficiency anemia, unspecified: Secondary | ICD-10-CM | POA: Insufficient documentation

## 2021-04-23 DIAGNOSIS — E538 Deficiency of other specified B group vitamins: Secondary | ICD-10-CM | POA: Insufficient documentation

## 2021-04-27 ENCOUNTER — Other Ambulatory Visit: Payer: Self-pay | Admitting: Family

## 2021-04-27 ENCOUNTER — Telehealth: Payer: Self-pay | Admitting: Internal Medicine

## 2021-04-27 DIAGNOSIS — R109 Unspecified abdominal pain: Secondary | ICD-10-CM

## 2021-04-27 NOTE — Telephone Encounter (Signed)
Sch per 11/39 los, pt aware

## 2021-04-30 ENCOUNTER — Encounter: Payer: Self-pay | Admitting: Internal Medicine

## 2021-04-30 ENCOUNTER — Ambulatory Visit
Admission: RE | Admit: 2021-04-30 | Discharge: 2021-04-30 | Disposition: A | Discharge status: Home / Self Care | Payer: Medicare Other | Source: Ambulatory Visit | Attending: Gastroenterology | Admitting: Gastroenterology

## 2021-04-30 ENCOUNTER — Other Ambulatory Visit: Payer: Medicare Other

## 2021-04-30 DIAGNOSIS — R1013 Epigastric pain: Secondary | ICD-10-CM

## 2021-04-30 DIAGNOSIS — R634 Abnormal weight loss: Secondary | ICD-10-CM

## 2021-04-30 MED ORDER — IOPAMIDOL (ISOVUE-300) INJECTION 61%
100.0000 mL | Freq: Once | INTRAVENOUS | Status: AC | PRN
  Administered 2021-04-30: 100 mL via INTRAVENOUS

## 2021-05-11 ENCOUNTER — Other Ambulatory Visit: Payer: Medicare Other

## 2021-05-12 ENCOUNTER — Telehealth: Payer: Self-pay | Admitting: Internal Medicine

## 2021-05-12 NOTE — Telephone Encounter (Signed)
Scheduled per sch msg. Called and spoke with patient. Confirmed appt  

## 2021-05-24 DIAGNOSIS — E3400 Carcinoid syndrome, unspecified: Secondary | ICD-10-CM

## 2021-05-24 HISTORY — DX: Carcinoid syndrome, unspecified: E34.00

## 2021-05-24 HISTORY — DX: Carcinoid syndrome: E34.0

## 2021-06-08 ENCOUNTER — Other Ambulatory Visit: Payer: Self-pay | Admitting: *Deleted

## 2021-06-08 DIAGNOSIS — D539 Nutritional anemia, unspecified: Secondary | ICD-10-CM

## 2021-06-09 ENCOUNTER — Inpatient Hospital Stay: Payer: Medicare Other | Admitting: Internal Medicine

## 2021-06-09 ENCOUNTER — Encounter: Payer: Self-pay | Admitting: Internal Medicine

## 2021-06-09 ENCOUNTER — Other Ambulatory Visit: Payer: Self-pay

## 2021-06-09 ENCOUNTER — Inpatient Hospital Stay: Payer: Medicare Other | Attending: Internal Medicine

## 2021-06-09 VITALS — BP 143/70 | HR 69 | Temp 98.2°F | Resp 17 | Ht 65.0 in | Wt 122.4 lb

## 2021-06-09 DIAGNOSIS — Z880 Allergy status to penicillin: Secondary | ICD-10-CM | POA: Diagnosis not present

## 2021-06-09 DIAGNOSIS — D61818 Other pancytopenia: Secondary | ICD-10-CM | POA: Diagnosis present

## 2021-06-09 DIAGNOSIS — Z85828 Personal history of other malignant neoplasm of skin: Secondary | ICD-10-CM | POA: Insufficient documentation

## 2021-06-09 DIAGNOSIS — R5383 Other fatigue: Secondary | ICD-10-CM | POA: Diagnosis not present

## 2021-06-09 DIAGNOSIS — D539 Nutritional anemia, unspecified: Secondary | ICD-10-CM

## 2021-06-09 DIAGNOSIS — D72819 Decreased white blood cell count, unspecified: Secondary | ICD-10-CM | POA: Insufficient documentation

## 2021-06-09 DIAGNOSIS — Z8719 Personal history of other diseases of the digestive system: Secondary | ICD-10-CM | POA: Insufficient documentation

## 2021-06-09 DIAGNOSIS — Z79899 Other long term (current) drug therapy: Secondary | ICD-10-CM | POA: Insufficient documentation

## 2021-06-09 DIAGNOSIS — Z888 Allergy status to other drugs, medicaments and biological substances status: Secondary | ICD-10-CM | POA: Insufficient documentation

## 2021-06-09 DIAGNOSIS — Z881 Allergy status to other antibiotic agents status: Secondary | ICD-10-CM | POA: Diagnosis not present

## 2021-06-09 DIAGNOSIS — R109 Unspecified abdominal pain: Secondary | ICD-10-CM | POA: Insufficient documentation

## 2021-06-09 DIAGNOSIS — Z87442 Personal history of urinary calculi: Secondary | ICD-10-CM | POA: Diagnosis not present

## 2021-06-09 DIAGNOSIS — D509 Iron deficiency anemia, unspecified: Secondary | ICD-10-CM | POA: Diagnosis present

## 2021-06-09 DIAGNOSIS — Z882 Allergy status to sulfonamides status: Secondary | ICD-10-CM | POA: Insufficient documentation

## 2021-06-09 LAB — CBC WITH DIFFERENTIAL (CANCER CENTER ONLY)
Abs Immature Granulocytes: 0.01 10*3/uL (ref 0.00–0.07)
Basophils Absolute: 0 10*3/uL (ref 0.0–0.1)
Basophils Relative: 1 %
Eosinophils Absolute: 0.1 10*3/uL (ref 0.0–0.5)
Eosinophils Relative: 3 %
HCT: 32.3 % — ABNORMAL LOW (ref 36.0–46.0)
Hemoglobin: 10.4 g/dL — ABNORMAL LOW (ref 12.0–15.0)
Immature Granulocytes: 0 %
Lymphocytes Relative: 11 %
Lymphs Abs: 0.4 10*3/uL — ABNORMAL LOW (ref 0.7–4.0)
MCH: 27.8 pg (ref 26.0–34.0)
MCHC: 32.2 g/dL (ref 30.0–36.0)
MCV: 86.4 fL (ref 80.0–100.0)
Monocytes Absolute: 0.3 10*3/uL (ref 0.1–1.0)
Monocytes Relative: 7 %
Neutro Abs: 2.7 10*3/uL (ref 1.7–7.7)
Neutrophils Relative %: 78 %
Platelet Count: 238 10*3/uL (ref 150–400)
RBC: 3.74 MIL/uL — ABNORMAL LOW (ref 3.87–5.11)
RDW: 21.8 % — ABNORMAL HIGH (ref 11.5–15.5)
WBC Count: 3.5 10*3/uL — ABNORMAL LOW (ref 4.0–10.5)
nRBC: 0 % (ref 0.0–0.2)

## 2021-06-09 LAB — IRON AND IRON BINDING CAPACITY (CC-WL,HP ONLY)
Iron: 36 ug/dL (ref 28–170)
Saturation Ratios: 8 % — ABNORMAL LOW (ref 10.4–31.8)
TIBC: 431 ug/dL (ref 250–450)
UIBC: 395 ug/dL (ref 148–442)

## 2021-06-09 LAB — FERRITIN: Ferritin: 31 ng/mL (ref 11–307)

## 2021-06-09 NOTE — Progress Notes (Signed)
[place] [phone]   [phone]  OFFICE PROGRESS NOTE  [address], [address] [place] [address]  DIAGNOSIS:  1) Pancytopenia of unclear etiology diagnosed in October 2020. 2) iron deficiency anemia  PRIOR THERAPY: None  CURRENT THERAPY: Observation.  INTERVAL HISTORY: Mary Leach 71 y.o. female returns to the clinic today for follow-up visit.  The patient is feeling much better today with no concerning complaints except for fatigue but this is improved compared to few months ago.  She denied having any current chest pain, shortness of breath, cough or hemoptysis.  She has no nausea, vomiting, diarrhea or constipation.  She has intermittent abdominal pain.  She was seen by Dr. Collene Mares recently and CT scan of the abdomen pelvis on April 30, 2021 showed central mesenteric adenopathy or small mass including a 4.2 x 1.8 x 1.7 cm soft tissue lesion and a 1.2 x 1.1 x 1.5 cm soft tissue lesion but no definite mass in the small intestine that was seen on the scan.  This finding were suspicious for reactive adenopathy, lymphoma or carcinoid tumor.  She was referred to general surgery and currently undergoing blood work for chromogranin A as well as 24-hour urine collection for 5 HIAA.  She is here today for evaluation and repeat blood work.   MEDICAL HISTORY: Past Medical History:  Diagnosis Date   Allergic rhinitis    Anxiety    Asthma    Bipolar disorder ([place])    Celiac disease    Depression    Elevated liver function tests 2019   Elevated testosterone level 2019   Elevated vitamin B12 level 2019   History of colonic polyps    Hypothyroidism    Ileus following gastrointestinal surgery ([place]) 10/18/2011   Leukopenia    Low TSH level 2019   Malnutrition following gastrointestinal surgery 10/18/2011   Osteoporosis 2018   RBBB (right bundle branch block)    Renal stone 2020    ALLERGIES:  is allergic to  food, gluten meal, corn oil, soy allergy, augmentin [amoxicillin-pot clavulanate], chocolate, other, sulfa antibiotics, sulfonamide derivatives, and tape.  MEDICATIONS:  Current Outpatient Medications  Medication Sig Dispense Refill   ARMOUR THYROID 120 MG tablet Take 1 tablet by mouth daily.     cetirizine (ZYRTEC) 10 MG tablet Take 10 mg by mouth every evening.      cholecalciferol (VITAMIN D3) 25 MCG (1000 UNIT) tablet Take 1,000 Units by mouth daily. Per pt, takes "Synergy K" which has Vit D3. Unable to locate this medication.     FLUoxetine (PROZAC) 10 MG capsule Take 3 capsules (30 mg total) by mouth daily. Takes 3 tablets in AM 270 capsule 3   magnesium gluconate (MAGONATE) 500 MG tablet Take 1,500 mg by mouth every evening.      Multiple Minerals-Vitamins (NUTRA-SUPPORT BONE) CAPS Take 1-2 tablets by mouth See admin instructions. Take 1 tablet every morning and 2 tablets every night     Multiple Vitamin (MULTIVITAMIN) tablet Take 1 tablet by mouth daily. PURE Encapsulations     pantoprazole (PROTONIX) 40 MG tablet Take 40 mg by mouth every morning.     Probiotic Product (PROBIOTIC DAILY PO) Take by mouth. metaflora IB     vitamin C (ASCORBIC ACID) 500 MG tablet Take 500 mg by mouth daily.     No current facility-administered medications for this visit.    SURGICAL HISTORY:  Past Surgical History:  Procedure Laterality  Date   CATARACT EXTRACTION Bilateral 11/2019   COLON SURGERY     COLONOSCOPY W/ POLYPECTOMY     COLOSTOMY TAKEDOWN  05/29/2012   Procedure: LAPAROSCOPIC COLOSTOMY TAKEDOWN;  Surgeon: Odis Hollingshead, MD;  Location: [facility];  Service: General;  Laterality: N/A;  Laparoscopic Assisted Colostomy Closure  with proctoscopy   CYSTECTOMY     LEFT FOOT, RIGHT HAND   DILATATION & CURETTAGE/HYSTEROSCOPY WITH MYOSURE N/A 07/25/2018   Procedure: DILATATION & CURETTAGE/HYSTEROSCOPY WITH MYOSURE RESECTION OF ENDOMETRIAL AND ENDOCERVICAL POLYP;  Surgeon: Nunzio Cobbs,  MD;  Location: [facility];  Service: Gynecology;  Laterality: N/A;   FOOT SURGERY     FOOT SURGERY Left 12/07/2018   cyst removed   LAPAROTOMY  10/07/2011   Procedure: EXPLORATORY LAPAROTOMY;  Surgeon: Odis Hollingshead, MD;  Location: [facility];  Service: General;  Laterality: N/A;  hartmann procedure and creation of colostomy   NASAL SEPTOPLASTY W/ TURBINOPLASTY Bilateral 11/11/2015   Procedure: SEPTOPLASTY BILATERAL TURBINATE  REDUCTION ;  Surgeon: Leta Baptist, MD;  Location: [facility];  Service: ENT;  Laterality: Bilateral;   NASAL SINUS SURGERY Bilateral 11/11/2015   Procedure: BILATERAL ENDOSCOPIC  MAXILLARY ANTROSTOMY;  Surgeon: Leta Baptist, MD;  Location: [facility];  Service: ENT;  Laterality: Bilateral;   SKIN BIOPSY Right 06/04/2019   basal cell carcinoma , nodular pattern   TONSILLECTOMY AND ADENOIDECTOMY     TUBAL LIGATION     UPPER GASTROINTESTINAL ENDOSCOPY  01/19/12    REVIEW OF SYSTEMS:  A comprehensive review of systems was negative except for: Constitutional: positive for fatigue   PHYSICAL EXAMINATION: General appearance: alert, cooperative, fatigued, and no distress Head: Normocephalic, without obvious abnormality, atraumatic Neck: no adenopathy, no JVD, supple, symmetrical, trachea midline, and thyroid not enlarged, symmetric, no tenderness/mass/nodules Lymph nodes: Cervical, supraclavicular, and axillary nodes normal. Resp: clear to auscultation bilaterally Back: symmetric, no curvature. ROM normal. No CVA tenderness. Cardio: regular rate and rhythm, S1, S2 normal, no murmur, click, rub or gallop GI: soft, non-tender; bowel sounds normal; no masses,  no organomegaly Extremities: extremities normal, atraumatic, no cyanosis or edema  ECOG PERFORMANCE STATUS: 1 - Symptomatic but completely ambulatory  Blood pressure (!) 143/70, pulse 69, temperature 98.2 F (36.8 C), temperature source Tympanic, resp. rate 17, height 5\' 5"  (1.651  m), weight 122 lb 6.4 oz (55.5 kg), last menstrual period 05/24/2006, SpO2 100 %.  LABORATORY DATA: Lab Results  Component Value Date   WBC 3.5 (L) 06/09/2021   HGB 10.4 (L) 06/09/2021   HCT 32.3 (L) 06/09/2021   MCV 86.4 06/09/2021   PLT 238 06/09/2021      Chemistry      Component Value Date/Time   NA 134 (L) 10/06/2020 1500   NA 134 (A) 07/22/2018 0000   K 4.1 10/06/2020 1500   CL 102 10/06/2020 1500   CO2 27 10/06/2020 1500   BUN 16 10/06/2020 1500   BUN 24 (A) 07/22/2018 0000   CREATININE 0.72 10/06/2020 1500   CREATININE 0.57 12/26/2014 1423   GLU 92 07/22/2018 0000      Component Value Date/Time   CALCIUM 8.7 (L) 10/06/2020 1500   ALKPHOS 117 10/06/2020 1500   AST 25 10/06/2020 1500   ALT 21 10/06/2020 1500   BILITOT 0.2 (L) 10/06/2020 1500       RADIOGRAPHIC STUDIES: No results found.  ASSESSMENT AND PLAN: This is a very pleasant 71 years old white female with persistent leukocytopenia of  unclear etiology.   The patient was found to have positive stool for Hemoccult but her gastrointestinal work-up including upper endoscopy and colonoscopy were unremarkable except for mild gastritis as well as a small colon polyp. She is currently followed by Dr. Collene Mares for her gastrointestinal bleeding.  CT scan of the abdomen and pelvis and early December showed suspicious central mesenteric adenopathy/mass concerning for lymphoma, reactive adenopathy or carcinoid tumor.  She is currently undergoing evaluation by general surgery to rule out carcinoid tumor before surgical intervention. Repeat CBC today showed improvement of her hemoglobin and hematocrit but she still have anemia with hemoglobin of 10.4 and hematocrit 32.3%.  Iron study showed serum iron of 36 and iron saturation of 8% ferritin level is still pending. I recommended for the patient to continue on observation with repeat blood work in 1 months.  I may consider The patient for iron infusion if her ferritin level is  low. She was advised to call immediately if she has any other concerning symptoms in the interval  The patient voices understanding of current disease status and treatment options and is in agreement with the current care plan.  All questions were answered. The patient knows to call the clinic with any problems, questions or concerns. We can certainly see the patient much sooner if necessary.  Disclaimer: This note was dictated with voice recognition software. Similar sounding words can inadvertently be transcribed and may not be corrected upon review.

## 2021-06-24 ENCOUNTER — Other Ambulatory Visit: Payer: Medicare Other

## 2021-06-24 ENCOUNTER — Ambulatory Visit: Payer: Medicare Other | Admitting: Internal Medicine

## 2021-07-06 ENCOUNTER — Ambulatory Visit: Payer: Medicare Other | Admitting: Internal Medicine

## 2021-07-06 ENCOUNTER — Other Ambulatory Visit: Payer: Medicare Other

## 2021-07-07 ENCOUNTER — Other Ambulatory Visit: Payer: Self-pay

## 2021-07-07 ENCOUNTER — Encounter: Payer: Self-pay | Admitting: Obstetrics and Gynecology

## 2021-07-07 ENCOUNTER — Ambulatory Visit (INDEPENDENT_AMBULATORY_CARE_PROVIDER_SITE_OTHER): Payer: Medicare Other | Admitting: Obstetrics and Gynecology

## 2021-07-07 VITALS — BP 124/62 | HR 77 | Ht 63.0 in | Wt 124.0 lb

## 2021-07-07 DIAGNOSIS — Z01419 Encounter for gynecological examination (general) (routine) without abnormal findings: Secondary | ICD-10-CM | POA: Diagnosis not present

## 2021-07-07 DIAGNOSIS — M810 Age-related osteoporosis without current pathological fracture: Secondary | ICD-10-CM

## 2021-07-07 NOTE — Patient Instructions (Signed)
EXERCISE AND DIET:  We recommended that you start or continue a regular exercise program for good health. Regular exercise means any activity that makes your heart beat faster and makes you sweat.  We recommend exercising at least 30 minutes per day at least 3 days a week, preferably 4 or 5.  We also recommend a diet low in fat and sugar.  Inactivity, poor dietary choices and obesity can cause diabetes, heart attack, stroke, and kidney damage, among others.    ALCOHOL AND SMOKING:  Women should limit their alcohol intake to no more than 7 drinks/beers/glasses of wine (combined, not each!) per week. Moderation of alcohol intake to this level decreases your risk of breast cancer and liver damage. And of course, no recreational drugs are part of a healthy lifestyle.  And absolutely no smoking or even second hand smoke. Most people know smoking can cause heart and lung diseases, but did you know it also contributes to weakening of your bones? Aging of your skin?  Yellowing of your teeth and nails?  CALCIUM AND VITAMIN D:  Adequate intake of calcium and Vitamin D are recommended.  The recommendations for exact amounts of these supplements seem to change often, but generally speaking 600 mg of calcium (either carbonate or citrate) and 800 units of Vitamin D per day seems prudent. Certain women may benefit from higher intake of Vitamin D.  If you are among these women, your doctor will have told you during your visit.    PAP SMEARS:  Pap smears, to check for cervical cancer or precancers,  have traditionally been done yearly, although recent scientific advances have shown that most women can have pap smears less often.  However, every woman still should have a physical exam from her gynecologist every year. It will include a breast check, inspection of the vulva and vagina to check for abnormal growths or skin changes, a visual exam of the cervix, and then an exam to evaluate the size and shape of the uterus and  ovaries.  And after 70 years of age, a rectal exam is indicated to check for rectal cancers. We will also provide age appropriate advice regarding health maintenance, like when you should have certain vaccines, screening for sexually transmitted diseases, bone density testing, colonoscopy, mammograms, etc.   MAMMOGRAMS:  All women over 56 years old should have a yearly mammogram. Many facilities now offer a "3D" mammogram, which may cost around $50 extra out of pocket. If possible,  we recommend you accept the option to have the 3D mammogram performed.  It both reduces the number of women who will be called back for extra views which then turn out to be normal, and it is better than the routine mammogram at detecting truly abnormal areas.    COLONOSCOPY:  Colonoscopy to screen for colon cancer is recommended for all women at age 56.  We know, you hate the idea of the prep.  We agree, BUT, having colon cancer and not knowing it is worse!!  Colon cancer so often starts as a polyp that can be seen and removed at colonscopy, which can quite literally save your life!  And if your first colonoscopy is normal and you have no family history of colon cancer, most women don't have to have it again for 10 years.  Once every ten years, you can do something that may end up saving your life, right?  We will be happy to help you get it scheduled when you are ready.  Be sure to check your insurance coverage so you understand how much it will cost.  It may be covered as a preventative service at no cost, but you should check your particular policy.    Carcinoid Tumors A carcinoid tumor is a rare type of cancer that usually grows very slowly. These tumors can grow in any area of the body where there are cells that receive signals from the nervous system (neuroendocrine cells). Carcinoid tumors are also called neuroendocrine tumors. Some of these tumors may secrete chemical messengers (hormones). The small intestine is the most  common place for carcinoid tumors to grow. They may also grow in other areas of the digestive system or in the lungs. There are three types of carcinoid tumors: Slow-growing tumors are the most common type. This type of tumor may not cause symptoms for many years. Faster-growing carcinoid tumors may spread to other areas of the body, including the liver. This type causes symptoms sooner. Hormone-secreting tumors cause symptoms because of the hormones they secrete. These symptoms are called carcinoid syndrome. What are the causes? Cancer results when healthy cells change and start to grow out of control. These cells form tumors. Exactly why this happens is not known. What increases the risk? You are more likely to develop this condition if: You are 37-70 years old. You have a family history of a condition called multiple endocrine neoplasia type 1 (MEN 1). You have a stomach condition that reduces the production of stomach acid, such as pernicious anemia, atrophic gastritis, or Zollinger-Ellison syndrome. What are the signs or symptoms? Slow-growing carcinoid tumors do not cause symptoms for many years. When symptoms do develop, they depend on the location of the tumor and whether the tumor secretes hormones that cause carcinoid syndrome. In some cases, the symptoms of carcinoid syndrome can be triggered by stress, exercise, alcohol (especially red wine), and some foods (especially cheese and chocolate). Symptoms of carcinoid syndrome may include: Flushing of the face and neck. Recurrent diarrhea. Shortness of breath or making high-pitched whistling sounds when you breathe, most often when you breathe out (wheezing). Fast or irregular heartbeats (palpitations). Swelling of the feet and ankles. Fainting. Symptoms of digestive system carcinoids may include: Abdominal pain. Diarrhea or constipation. Inability to have a bowel movement. Nausea or vomiting. Rectal bleeding. Symptoms of lung  carcinoids may include: Chest pain. Shortness of breath. Cough, sometimes with blood. How is this diagnosed? Carcinoid tumors that do not cause symptoms may be found during imaging tests or procedures done to diagnose other conditions. If your health care provider suspects a carcinoid tumor, you may have tests to confirm the diagnosis. These may include: A 24-hour urine sample. Blood tests. A type of imaging scan that is done after you get an injection of a radioactive dye that will go to a carcinoid tumor (octreotide scan). Other imaging tests, such as an X-ray, MRI, PET scan, or CT scan. A procedure to remove a sample of the tumor (biopsy) for testing. How is this treated? Treatment depends on the size and location of the tumor. For a small tumor that has not spread to other areas of the body, surgery can be done to remove the tumor. This is the most common treatment and usually cures this type of cancer. For tumors that are too big to remove or have spread to other parts of the body, treatment options may include: Surgically removing part of the tumor to make other treatments more effective. Taking medicines such as: A medicine made  from a naturally occurring hormone (octreotide). Injections of octreotide may reduce symptoms and reverse tumor growth. A medicine that boosts the immune system (interferon). This medicine may be used to increase natural resistance to cancer. Several types of cancer drugs (chemotherapy). These may be given through an IV or taken by mouth in various combinations. Targeted therapy. This involves using drugs that target specific parts of cancer cells and the area around them to block the growth and spread of the cancer. Using high-energy rays to kill cancer cells (radiation therapy). This may be used for some carcinoid tumors. For carcinoid cancer that has spread to the liver, treatment may include having a procedure that involves using radio waves that destroy  cancer, either with heat (radiofrequency ablation) or with freezing (cryoablation). Pellets may be injected to block blood supply to the tumor (embolization). Follow these instructions at home:   Take over-the-counter and prescription medicines only as told by your health care provider. Follow diet instructions from your health care provider. This may include adding more protein to your diet. You may also need to avoid any foods that trigger your symptoms. Do not drink alcohol, especially red wine. Do not use any products that contain nicotine or tobacco. These products include cigarettes, chewing tobacco, and vaping devices, such as e-cigarettes. If you need help quitting, ask your health care provider. Take actions to manage stress, such as doing meditation or deep breathing techniques. Keep all follow-up visits. This is important. Where to find more information [place]: www.cancer.gov Contact a health care provider if you: Develop any symptoms of carcinoid syndrome. Develop any new symptoms. Get help right away if you have: Chest pain. Trouble breathing. These symptoms may be an emergency. Get help right away. Call 911. Do not wait to see if the symptoms will go away. Do not drive yourself to the hospital. Summary A carcinoid tumor is a rare type of cancer that usually grows very slowly. Slow-growing carcinoid tumors do not cause symptoms for many years. When symptoms do occur, they depend on the location of the tumor and whether the tumor secretes hormones that cause carcinoid syndrome. Carcinoid syndrome may cause flushing, wheezing, palpitations, diarrhea, swelling, and fainting. You may need blood tests, imaging tests, and a biopsy to confirm a diagnosis of carcinoid tumors. Surgery is the best treatment for a carcinoid tumor when it is still small. There are several other treatment options for tumors that are large or have spread to other areas of the body. This  information is not intended to replace advice given to you by your health care provider. Make sure you discuss any questions you have with your health care provider. Document Revised: 12/30/2020 Document Reviewed: 12/30/2020 Elsevier Patient Education  [place].

## 2021-07-07 NOTE — Progress Notes (Signed)
71 y.o. G2P2 Married Caucasian female here for annual breast and pelvic exam.    No vaginal bleeding.   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 CT scan in Epic. Patient presented with anemia and abdominal pain.  Dealing with fatigue.   PCP:  Freddie Apley, FNP   Patient's last menstrual period was 05/24/2006.           Sexually active: No.  The current method of family planning is tubal ligation.    Exercising: Yes.     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 TDaP:  PCP Gardasil:   no HIV: 02-23-19  NR Hep C: 04-06-16 Neg Screening Labs: PCP   reports that she has never smoked. She has never used smokeless tobacco. She reports current alcohol use of about 4.0 - 5.0 standard drinks per week. She reports that she does not use drugs.  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   sees Dr.Lovick at [facility]   Celiac disease    Depression    Elevated liver function tests 2019   Elevated testosterone level 2019   Elevated vitamin B12 level 2019   History of colonic polyps    Hypothyroidism    Ileus following gastrointestinal surgery ([place]) 10/18/2011   Leukopenia    Low TSH level 2019   Malnutrition following gastrointestinal surgery 10/18/2011   Osteoporosis 2018   RBBB (right bundle branch block)    Renal stone 2020    Past Surgical History:  Procedure Laterality Date   CATARACT EXTRACTION Bilateral 11/2019   COLON SURGERY     COLONOSCOPY W/ POLYPECTOMY     COLOSTOMY TAKEDOWN  05/29/2012   Procedure: LAPAROSCOPIC COLOSTOMY TAKEDOWN;  Surgeon: Odis Hollingshead, MD;  Location:  [facility];  Service: General;  Laterality: N/A;  Laparoscopic Assisted Colostomy Closure  with proctoscopy   CYSTECTOMY     LEFT FOOT, RIGHT HAND   DILATATION & CURETTAGE/HYSTEROSCOPY WITH MYOSURE N/A 07/25/2018   Procedure: DILATATION & CURETTAGE/HYSTEROSCOPY WITH MYOSURE RESECTION OF ENDOMETRIAL AND ENDOCERVICAL POLYP;  Surgeon: Nunzio Cobbs, MD;  Location: [facility];  Service: Gynecology;  Laterality: N/A;   FOOT SURGERY     FOOT SURGERY Left 12/07/2018   cyst removed   LAPAROTOMY  10/07/2011   Procedure: EXPLORATORY LAPAROTOMY;  Surgeon: Odis Hollingshead, MD;  Location: [facility];  Service: General;  Laterality: N/A;  hartmann procedure and creation of colostomy   NASAL SEPTOPLASTY W/ TURBINOPLASTY Bilateral 11/11/2015   Procedure: SEPTOPLASTY BILATERAL TURBINATE  REDUCTION ;  Surgeon: Leta Baptist, MD;  Location: [facility];  Service: ENT;  Laterality: Bilateral;   NASAL SINUS SURGERY Bilateral 11/11/2015   Procedure: BILATERAL ENDOSCOPIC  MAXILLARY ANTROSTOMY;  Surgeon: Leta Baptist, MD;  Location: [place];  Service: ENT;  Laterality: Bilateral;   SKIN BIOPSY Right 06/04/2019   basal cell carcinoma , nodular pattern   TONSILLECTOMY AND ADENOIDECTOMY     TUBAL LIGATION     UPPER GASTROINTESTINAL ENDOSCOPY  01/19/12    Current Outpatient Medications  Medication Sig Dispense  Refill   ARMOUR THYROID 120 MG tablet Take 1 tablet by mouth daily.     cetirizine (ZYRTEC) 10 MG tablet Take 10 mg by mouth every evening.      cholecalciferol (VITAMIN D3) 25 MCG (1000 UNIT) tablet Take 1,000 Units by mouth daily. Per pt, takes "Synergy K" which has Vit D3. Unable to locate this medication.     FLUoxetine (PROZAC) 10 MG capsule Take 3 capsules (30 mg total) by mouth daily. Takes 3 tablets in AM 270 capsule 3   magnesium gluconate (MAGONATE) 500 MG tablet Take 1,500 mg by mouth every evening.      Menaquinone-7 (VITAMIN K2 PO) Take 1 tablet by mouth  daily.     Multiple Minerals-Vitamins (NUTRA-SUPPORT BONE) CAPS Take 1-2 tablets by mouth See admin instructions. Take 1 tablet every morning and 2 tablets every night     Multiple Vitamin (MULTIVITAMIN) tablet Take 1 tablet by mouth daily. PURE Encapsulations     Multiple Vitamins-Minerals (ZINC PO) Take 1 tablet by mouth daily.     OVER THE COUNTER MEDICATION Vitamin K1 and D3 mixed Takes 1 tablet daily     pantoprazole (PROTONIX) 40 MG tablet Take 40 mg by mouth every morning.     Probiotic Product (PROBIOTIC DAILY PO) Take by mouth. metaflora IB     vitamin C (ASCORBIC ACID) 500 MG tablet Take 500 mg by mouth daily.     No current facility-administered medications for this visit.    Family History  Problem Relation Age of Onset   Heart disease Father    Osteoporosis Father    Osteoporosis Mother    Mental illness Paternal Aunt    Skin cancer Maternal Grandmother    Hypertension Maternal Grandfather    Heart disease Paternal Grandfather    Celiac disease Grandchild     Review of Systems  All other systems reviewed and are negative.  Exam:   BP 124/62    Pulse 77    Ht 5' 3"  (1.6 m)    Wt 124 lb (56.2 kg)    LMP 05/24/2006    SpO2 97%    BMI 21.97 kg/m     General appearance: alert, cooperative and appears stated age Head: normocephalic, without obvious abnormality, atraumatic Neck: no adenopathy, supple, symmetrical, trachea midline and thyroid normal to inspection and palpation Lungs: clear to auscultation bilaterally Breasts: normal appearance, no masses or tenderness, No nipple retraction or dimpling, No nipple discharge or bleeding, No axillary adenopathy Heart: regular rate and rhythm Abdomen: soft, non-tender; no masses, no organomegaly Extremities: extremities normal, atraumatic, no cyanosis or edema Skin: skin color, texture, turgor normal. No rashes or lesions Lymph nodes: cervical, supraclavicular, and axillary nodes normal. Neurologic: grossly normal  Pelvic:  External genitalia:  no lesions              No abnormal inguinal nodes palpated.              Urethra:  normal appearing urethra with no masses, tenderness or lesions              Bartholins and Skenes: normal                 Vagina: normal appearing vagina with normal color and discharge, no lesions              Cervix: no lesions              Pap taken: no Bimanual Exam:  Uterus:  normal size,  contour, position, consistency, mobility, non-tender              Adnexa: no mass, fullness, tenderness              Rectal exam: yes.  Confirms.              Anus:  normal sphincter tone, no lesions  Chaperone was present for exam:  Estill Bamberg, CMA  Assessment:   Well woman visit with gynecologic exam. Abdominal mass.  Suspected carcinoid syndrome.  Osteoporosis of wrist, osteopenia of hip.  Plan: Mammogram screening discussed. Self breast awareness reviewed. Pap and HR HPV as above. Guidelines for Calcium, Vitamin D, regular exercise program including cardiovascular and weight bearing exercise. She declines bone density testing and treatment of osteoporosis at this time.  We discussed avoiding falls.  Follow up for breast/pelvic/pap in 2 years and follow up prn. After visit summary provided.   29 min  total time was spent for this patient encounter, including preparation, face-to-face counseling with the patient, coordination of care, and documentation of the encounter.

## 2021-07-08 ENCOUNTER — Other Ambulatory Visit: Payer: Self-pay

## 2021-07-08 DIAGNOSIS — K5900 Constipation, unspecified: Secondary | ICD-10-CM | POA: Insufficient documentation

## 2021-07-08 DIAGNOSIS — D539 Nutritional anemia, unspecified: Secondary | ICD-10-CM

## 2021-07-08 DIAGNOSIS — R1900 Intra-abdominal and pelvic swelling, mass and lump, unspecified site: Secondary | ICD-10-CM | POA: Insufficient documentation

## 2021-07-09 ENCOUNTER — Inpatient Hospital Stay: Payer: Medicare Other | Attending: Internal Medicine

## 2021-07-09 ENCOUNTER — Inpatient Hospital Stay: Payer: Medicare Other | Admitting: Internal Medicine

## 2021-07-09 ENCOUNTER — Other Ambulatory Visit: Payer: Self-pay

## 2021-07-09 ENCOUNTER — Encounter: Payer: Self-pay | Admitting: Internal Medicine

## 2021-07-09 VITALS — BP 119/76 | HR 74 | Temp 97.2°F | Resp 20 | Ht 63.0 in | Wt 123.0 lb

## 2021-07-09 DIAGNOSIS — Z888 Allergy status to other drugs, medicaments and biological substances status: Secondary | ICD-10-CM | POA: Diagnosis not present

## 2021-07-09 DIAGNOSIS — Z881 Allergy status to other antibiotic agents status: Secondary | ICD-10-CM | POA: Diagnosis not present

## 2021-07-09 DIAGNOSIS — R5383 Other fatigue: Secondary | ICD-10-CM | POA: Diagnosis not present

## 2021-07-09 DIAGNOSIS — D708 Other neutropenia: Secondary | ICD-10-CM

## 2021-07-09 DIAGNOSIS — D539 Nutritional anemia, unspecified: Secondary | ICD-10-CM

## 2021-07-09 DIAGNOSIS — Z85828 Personal history of other malignant neoplasm of skin: Secondary | ICD-10-CM | POA: Diagnosis not present

## 2021-07-09 DIAGNOSIS — Z87442 Personal history of urinary calculi: Secondary | ICD-10-CM | POA: Insufficient documentation

## 2021-07-09 DIAGNOSIS — D509 Iron deficiency anemia, unspecified: Secondary | ICD-10-CM | POA: Diagnosis present

## 2021-07-09 DIAGNOSIS — Z79899 Other long term (current) drug therapy: Secondary | ICD-10-CM | POA: Insufficient documentation

## 2021-07-09 DIAGNOSIS — Z882 Allergy status to sulfonamides status: Secondary | ICD-10-CM | POA: Insufficient documentation

## 2021-07-09 DIAGNOSIS — D61818 Other pancytopenia: Secondary | ICD-10-CM | POA: Insufficient documentation

## 2021-07-09 DIAGNOSIS — Z8719 Personal history of other diseases of the digestive system: Secondary | ICD-10-CM | POA: Insufficient documentation

## 2021-07-09 DIAGNOSIS — D72819 Decreased white blood cell count, unspecified: Secondary | ICD-10-CM | POA: Diagnosis not present

## 2021-07-09 DIAGNOSIS — Z880 Allergy status to penicillin: Secondary | ICD-10-CM | POA: Insufficient documentation

## 2021-07-09 LAB — CMP (CANCER CENTER ONLY)
ALT: 15 U/L (ref 0–44)
AST: 22 U/L (ref 15–41)
Albumin: 3.9 g/dL (ref 3.5–5.0)
Alkaline Phosphatase: 101 U/L (ref 38–126)
Anion gap: 3 — ABNORMAL LOW (ref 5–15)
BUN: 20 mg/dL (ref 8–23)
CO2: 30 mmol/L (ref 22–32)
Calcium: 8.9 mg/dL (ref 8.9–10.3)
Chloride: 104 mmol/L (ref 98–111)
Creatinine: 0.64 mg/dL (ref 0.44–1.00)
GFR, Estimated: >60 mL/min (ref >60)
Glucose, Bld: 72 mg/dL (ref 70–99)
Potassium: 4.2 mmol/L (ref 3.5–5.1)
Sodium: 137 mmol/L (ref 135–145)
Total Bilirubin: 0.3 mg/dL (ref 0.3–1.2)
Total Protein: 6.8 g/dL (ref 6.5–8.1)

## 2021-07-09 LAB — CBC WITH DIFFERENTIAL (CANCER CENTER ONLY)
Abs Immature Granulocytes: 0 10*3/uL (ref 0.00–0.07)
Basophils Absolute: 0 10*3/uL (ref 0.0–0.1)
Basophils Relative: 1 %
Eosinophils Absolute: 0.2 10*3/uL (ref 0.0–0.5)
Eosinophils Relative: 7 %
HCT: 31.6 % — ABNORMAL LOW (ref 36.0–46.0)
Hemoglobin: 10.4 g/dL — ABNORMAL LOW (ref 12.0–15.0)
Immature Granulocytes: 0 %
Lymphocytes Relative: 17 %
Lymphs Abs: 0.4 10*3/uL — ABNORMAL LOW (ref 0.7–4.0)
MCH: 28.3 pg (ref 26.0–34.0)
MCHC: 32.9 g/dL (ref 30.0–36.0)
MCV: 86.1 fL (ref 80.0–100.0)
Monocytes Absolute: 0.3 10*3/uL (ref 0.1–1.0)
Monocytes Relative: 12 %
Neutro Abs: 1.4 10*3/uL — ABNORMAL LOW (ref 1.7–7.7)
Neutrophils Relative %: 63 %
Platelet Count: 246 10*3/uL (ref 150–400)
RBC: 3.67 MIL/uL — ABNORMAL LOW (ref 3.87–5.11)
RDW: 15.5 % (ref 11.5–15.5)
WBC Count: 2.2 10*3/uL — ABNORMAL LOW (ref 4.0–10.5)
nRBC: 0 % (ref 0.0–0.2)

## 2021-07-09 LAB — IRON AND IRON BINDING CAPACITY (CC-WL,HP ONLY)
Iron: 30 ug/dL (ref 28–170)
Saturation Ratios: 7 % — ABNORMAL LOW (ref 10.4–31.8)
TIBC: 447 ug/dL (ref 250–450)
UIBC: 417 ug/dL (ref 148–442)

## 2021-07-09 LAB — FERRITIN: Ferritin: 8 ng/mL — ABNORMAL LOW (ref 11–307)

## 2021-07-09 NOTE — Progress Notes (Signed)
[facility] [phone]   [phone]  OFFICE PROGRESS NOTE  [address] [place] [address]  DIAGNOSIS:  1) Pancytopenia of unclear etiology diagnosed in October 2020. 2) iron deficiency anemia  PRIOR THERAPY: None  CURRENT THERAPY: Observation.  INTERVAL HISTORY: Mary Leach 71 y.o. female returns to the clinic today for follow-up visit.  The patient is feeling fine today with no concerning complaints except for mild fatigue.  She denied having any current chest pain, shortness of breath, cough or hemoptysis.  She denied having any fever or chills.  She has no nausea, vomiting, diarrhea or constipation.  She has no headache or visual changes.  She was referred by her general surgeon to another surgeon at [facility] for a second opinion regarding her mesenteric mass.  The patient is here today for evaluation with repeat CBC, iron study and ferritin.   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   sees Dr.Lovick at [facility]   Celiac disease    Depression    Elevated liver function tests 2019   Elevated testosterone level 2019   Elevated vitamin B12 level 2019   History of colonic polyps    Hypothyroidism    Ileus following gastrointestinal surgery ([place]) 10/18/2011   Leukopenia    Low TSH level 2019   Malnutrition following gastrointestinal surgery 10/18/2011   Osteoporosis 2018   RBBB (right bundle branch block)    Renal stone 2020    ALLERGIES:  is allergic to food, gluten meal, soy allergy, augmentin [amoxicillin-pot clavulanate], chocolate, corn-containing products, other, sulfa antibiotics, sulfonamide derivatives, and tape.  MEDICATIONS:  Current Outpatient Medications  Medication Sig Dispense Refill   ARMOUR THYROID 120 MG tablet Take 1 tablet by mouth daily.      cetirizine (ZYRTEC) 10 MG tablet Take 10 mg by mouth every evening.      cholecalciferol (VITAMIN D3) 25 MCG (1000 UNIT) tablet Take 1,000 Units by mouth daily. Per pt, takes "Synergy K" which has Vit D3. Unable to locate this medication.     FLUoxetine (PROZAC) 10 MG capsule Take 3 capsules (30 mg total) by mouth daily. Takes 3 tablets in AM 270 capsule 3   magnesium gluconate (MAGONATE) 500 MG tablet Take 1,500 mg by mouth every evening.      Menaquinone-7 (VITAMIN K2 PO) Take 1 tablet by mouth daily.     Multiple Minerals-Vitamins (NUTRA-SUPPORT BONE) CAPS Take 1-2 tablets by mouth See admin instructions. Take 1 tablet every morning and 2 tablets every night     Multiple Vitamin (MULTIVITAMIN) tablet Take 1 tablet by mouth daily. PURE Encapsulations     Multiple Vitamins-Minerals (ZINC PO) Take 1 tablet by mouth daily.     OVER THE COUNTER MEDICATION Vitamin K1 and D3 mixed Takes 1 tablet daily     pantoprazole (PROTONIX) 40 MG tablet Take 40 mg by mouth every morning.     Probiotic Product (PROBIOTIC DAILY PO) Take by mouth. metaflora IB     vitamin C (ASCORBIC ACID) 500 MG tablet Take 500 mg by mouth daily.     No current facility-administered medications for this visit.    SURGICAL HISTORY:  Past Surgical History:  Procedure Laterality Date   CATARACT EXTRACTION Bilateral 11/2019   COLON SURGERY  COLONOSCOPY W/ POLYPECTOMY     COLOSTOMY TAKEDOWN  05/29/2012   Procedure: LAPAROSCOPIC COLOSTOMY TAKEDOWN;  Surgeon: Odis Hollingshead, MD;  Location: [facility];  Service: General;  Laterality: N/A;  Laparoscopic Assisted Colostomy Closure  with proctoscopy   CYSTECTOMY     LEFT FOOT, RIGHT HAND   DILATATION & CURETTAGE/HYSTEROSCOPY WITH MYOSURE N/A 07/25/2018   Procedure: DILATATION & CURETTAGE/HYSTEROSCOPY WITH MYOSURE RESECTION OF ENDOMETRIAL AND ENDOCERVICAL POLYP;  Surgeon: Nunzio Cobbs, MD;  Location: [facility];  Service: Gynecology;  Laterality: N/A;    FOOT SURGERY     FOOT SURGERY Left 12/07/2018   cyst removed   LAPAROTOMY  10/07/2011   Procedure: EXPLORATORY LAPAROTOMY;  Surgeon: Odis Hollingshead, MD;  Location: [facility];  Service: General;  Laterality: N/A;  hartmann procedure and creation of colostomy   NASAL SEPTOPLASTY W/ TURBINOPLASTY Bilateral 11/11/2015   Procedure: SEPTOPLASTY BILATERAL TURBINATE  REDUCTION ;  Surgeon: Leta Baptist, MD;  Location: [place];  Service: ENT;  Laterality: Bilateral;   NASAL SINUS SURGERY Bilateral 11/11/2015   Procedure: BILATERAL ENDOSCOPIC  MAXILLARY ANTROSTOMY;  Surgeon: Leta Baptist, MD;  Location: [facility];  Service: ENT;  Laterality: Bilateral;   SKIN BIOPSY Right 06/04/2019   basal cell carcinoma , nodular pattern   TONSILLECTOMY AND ADENOIDECTOMY     TUBAL LIGATION     UPPER GASTROINTESTINAL ENDOSCOPY  01/19/12    REVIEW OF SYSTEMS:  A comprehensive review of systems was negative except for: Constitutional: positive for fatigue   PHYSICAL EXAMINATION: General appearance: alert, cooperative, fatigued, and no distress Head: Normocephalic, without obvious abnormality, atraumatic Neck: no adenopathy, no JVD, supple, symmetrical, trachea midline, and thyroid not enlarged, symmetric, no tenderness/mass/nodules Lymph nodes: Cervical, supraclavicular, and axillary nodes normal. Resp: clear to auscultation bilaterally Back: symmetric, no curvature. ROM normal. No CVA tenderness. Cardio: regular rate and rhythm, S1, S2 normal, no murmur, click, rub or gallop GI: soft, non-tender; bowel sounds normal; no masses,  no organomegaly Extremities: extremities normal, atraumatic, no cyanosis or edema  ECOG PERFORMANCE STATUS: 1 - Symptomatic but completely ambulatory  Blood pressure 119/76, pulse 74, temperature (!) 97.2 F (36.2 C), temperature source Tympanic, resp. rate 20, height 5\' 3"  (1.6 m), weight 123 lb (55.8 kg), last menstrual period 05/24/2006, SpO2 100 %.  LABORATORY  DATA: Lab Results  Component Value Date   WBC 2.2 (L) 07/09/2021   HGB 10.4 (L) 07/09/2021   HCT 31.6 (L) 07/09/2021   MCV 86.1 07/09/2021   PLT 246 07/09/2021      Chemistry      Component Value Date/Time   NA 134 (L) 10/06/2020 1500   NA 134 (A) 07/22/2018 0000   K 4.1 10/06/2020 1500   CL 102 10/06/2020 1500   CO2 27 10/06/2020 1500   BUN 16 10/06/2020 1500   BUN 24 (A) 07/22/2018 0000   CREATININE 0.72 10/06/2020 1500   CREATININE 0.57 12/26/2014 1423   GLU 92 07/22/2018 0000      Component Value Date/Time   CALCIUM 8.7 (L) 10/06/2020 1500   ALKPHOS 117 10/06/2020 1500   AST 25 10/06/2020 1500   ALT 21 10/06/2020 1500   BILITOT 0.2 (L) 10/06/2020 1500       RADIOGRAPHIC STUDIES: No results found.  ASSESSMENT AND PLAN: This is a very pleasant 71 years old white female with persistent leukocytopenia of unclear etiology.   The patient was found to have positive stool for Hemoccult but her gastrointestinal  work-up including upper endoscopy and colonoscopy were unremarkable except for mild gastritis as well as a small colon polyp. She is currently followed by Dr. Collene Mares for her gastrointestinal bleeding.  CT scan of the abdomen and pelvis and early December showed suspicious central mesenteric adenopathy/mass concerning for lymphoma, reactive adenopathy or carcinoid tumor.   The patient was referred to [facility] surgery for evaluation and second opinion regarding her mesenteric mass. Repeat CBC today showed persistent but stable anemia with leukocytopenia.  Comprehensive metabolic panel is unremarkable. I recommended for the patient to continue on observation for now with repeat CBC, iron study and ferritin in 3 months. She was advised to call immediately if she has any other concerning symptoms in the interval. The patient voices understanding of current disease status and treatment options and is in agreement with the current care plan.  All questions were answered.  The patient knows to call the clinic with any problems, questions or concerns. We can certainly see the patient much sooner if necessary.  Disclaimer: This note was dictated with voice recognition software. Similar sounding words can inadvertently be transcribed and may not be corrected upon review.

## 2021-07-12 ENCOUNTER — Other Ambulatory Visit: Payer: Self-pay | Admitting: Internal Medicine

## 2021-07-13 ENCOUNTER — Encounter: Payer: Self-pay | Admitting: Internal Medicine

## 2021-07-14 LAB — CHROMOGRANIN A: Chromogranin A (ng/mL): 589.2 ng/mL — ABNORMAL HIGH (ref 0.0–101.8)

## 2021-07-15 ENCOUNTER — Other Ambulatory Visit: Payer: Self-pay

## 2021-07-15 ENCOUNTER — Ambulatory Visit (INDEPENDENT_AMBULATORY_CARE_PROVIDER_SITE_OTHER): Payer: Medicare Other

## 2021-07-15 VITALS — BP 138/74 | HR 62 | Temp 98.1°F | Resp 18 | Ht 63.0 in | Wt 123.2 lb

## 2021-07-15 DIAGNOSIS — D539 Nutritional anemia, unspecified: Secondary | ICD-10-CM

## 2021-07-15 MED ORDER — DIPHENHYDRAMINE HCL 50 MG/ML IJ SOLN: 50.0000 mg | Freq: Once | INTRAMUSCULAR | Status: DC | PRN

## 2021-07-15 MED ORDER — FAMOTIDINE IN NACL 20-0.9 MG/50ML-% IV SOLN: 20.0000 mg | Freq: Once | INTRAVENOUS | Status: DC | PRN

## 2021-07-15 MED ORDER — METHYLPREDNISOLONE SODIUM SUCC 125 MG IJ SOLR: 125.0000 mg | Freq: Once | INTRAMUSCULAR | Status: DC | PRN

## 2021-07-15 MED ORDER — EPINEPHRINE 0.3 MG/0.3ML IJ SOAJ: 0.3000 mg | Freq: Once | INTRAMUSCULAR | Status: DC | PRN

## 2021-07-15 MED ORDER — ALBUTEROL SULFATE HFA 108 (90 BASE) MCG/ACT IN AERS: 2.0000 | INHALATION_SPRAY | Freq: Once | RESPIRATORY_TRACT | Status: DC | PRN

## 2021-07-15 MED ORDER — SODIUM CHLORIDE 0.9 % IV SOLN
300.0000 mg | INTRAVENOUS | Status: DC
  Administered 2021-07-15: 300 mg via INTRAVENOUS
  Filled 2021-07-15: qty 15

## 2021-07-15 MED ORDER — SODIUM CHLORIDE 0.9 % IV SOLN: Freq: Once | INTRAVENOUS | Status: DC | PRN

## 2021-07-15 NOTE — Progress Notes (Signed)
Diagnosis: Iron Deficiency Anemia  Provider:  Marshell Garfinkel, MD  Procedure: Infusion  IV Type: Peripheral, IV Location: R Antecubital  Venofer (Iron Sucrose), Dose: 300 mg  Infusion Start Time: 8469  Infusion Stop Time: 6295  Post Infusion IV Care: Peripheral IV Discontinued  Discharge: Condition: Good, Destination: Home . AVS provided to patient.   Performed by:  Koren Shiver, RN

## 2021-07-22 ENCOUNTER — Ambulatory Visit (INDEPENDENT_AMBULATORY_CARE_PROVIDER_SITE_OTHER): Payer: Medicare Other | Admitting: *Deleted

## 2021-07-22 ENCOUNTER — Other Ambulatory Visit: Payer: Self-pay

## 2021-07-22 VITALS — BP 143/69 | HR 65 | Temp 98.1°F | Resp 18 | Ht 63.0 in | Wt 126.0 lb

## 2021-07-22 DIAGNOSIS — D539 Nutritional anemia, unspecified: Secondary | ICD-10-CM

## 2021-07-22 MED ORDER — SODIUM CHLORIDE 0.9 % IV SOLN
300.0000 mg | INTRAVENOUS | Status: DC
  Administered 2021-07-22: 300 mg via INTRAVENOUS
  Filled 2021-07-22: qty 15

## 2021-07-22 MED ORDER — DIPHENHYDRAMINE HCL 50 MG/ML IJ SOLN: 50.0000 mg | Freq: Once | INTRAMUSCULAR | Status: DC | PRN

## 2021-07-22 MED ORDER — ALBUTEROL SULFATE HFA 108 (90 BASE) MCG/ACT IN AERS: 2.0000 | INHALATION_SPRAY | Freq: Once | RESPIRATORY_TRACT | Status: DC | PRN

## 2021-07-22 MED ORDER — EPINEPHRINE 0.3 MG/0.3ML IJ SOAJ: 0.3000 mg | Freq: Once | INTRAMUSCULAR | Status: DC | PRN

## 2021-07-22 MED ORDER — SODIUM CHLORIDE 0.9 % IV SOLN: Freq: Once | INTRAVENOUS | Status: DC | PRN

## 2021-07-22 MED ORDER — FAMOTIDINE IN NACL 20-0.9 MG/50ML-% IV SOLN: 20.0000 mg | Freq: Once | INTRAVENOUS | Status: DC | PRN

## 2021-07-22 MED ORDER — METHYLPREDNISOLONE SODIUM SUCC 125 MG IJ SOLR: 125.0000 mg | Freq: Once | INTRAMUSCULAR | Status: DC | PRN

## 2021-07-22 NOTE — Progress Notes (Signed)
Diagnosis: Iron Deficiency Anemia ? ?Provider:  Marshell Garfinkel, MD ? ?Procedure: Infusion ? ?IV Type: Peripheral, IV Location: L Antecubital ? ?Venofer (Iron Sucrose), Dose: 300 mg ? ?Infusion Start Time: 5956 ? ?Infusion Stop Time: 1632 ? ?Post Infusion IV Care: Peripheral IV Discontinued ? ?Discharge: Condition: Good, Destination: Home . AVS provided to patient.  ? ?Performed by:  Ludwig Lean, RN  ?  ?

## 2021-07-29 ENCOUNTER — Ambulatory Visit: Payer: Medicare Other

## 2021-07-29 MED ORDER — DIPHENHYDRAMINE HCL 50 MG/ML IJ SOLN: 50.0000 mg | Freq: Once | INTRAMUSCULAR | Status: AC | PRN

## 2021-07-29 MED ORDER — SODIUM CHLORIDE 0.9 % IV SOLN
300.0000 mg | INTRAVENOUS | Status: AC
  Filled 2021-07-29: qty 15

## 2021-07-29 MED ORDER — FAMOTIDINE IN NACL 20-0.9 MG/50ML-% IV SOLN: 20.0000 mg | Freq: Once | INTRAVENOUS | Status: AC | PRN

## 2021-07-29 MED ORDER — METHYLPREDNISOLONE SODIUM SUCC 125 MG IJ SOLR: 125.0000 mg | Freq: Once | INTRAMUSCULAR | Status: AC | PRN

## 2021-07-29 MED ORDER — ALBUTEROL SULFATE HFA 108 (90 BASE) MCG/ACT IN AERS: 2.0000 | INHALATION_SPRAY | Freq: Once | RESPIRATORY_TRACT | Status: AC | PRN

## 2021-07-29 MED ORDER — SODIUM CHLORIDE 0.9 % IV SOLN: Freq: Once | INTRAVENOUS | Status: AC | PRN

## 2021-07-29 MED ORDER — EPINEPHRINE 0.3 MG/0.3ML IJ SOAJ: 0.3000 mg | Freq: Once | INTRAMUSCULAR | Status: AC | PRN

## 2021-07-30 ENCOUNTER — Ambulatory Visit: Payer: Medicare Other

## 2021-08-03 ENCOUNTER — Other Ambulatory Visit: Payer: Self-pay

## 2021-08-03 ENCOUNTER — Ambulatory Visit (INDEPENDENT_AMBULATORY_CARE_PROVIDER_SITE_OTHER): Payer: Medicare Other

## 2021-08-03 VITALS — BP 123/74 | HR 59 | Temp 98.3°F | Resp 16 | Ht 63.0 in | Wt 124.0 lb

## 2021-08-03 DIAGNOSIS — D539 Nutritional anemia, unspecified: Secondary | ICD-10-CM

## 2021-08-03 MED ORDER — SODIUM CHLORIDE 0.9 % IV SOLN
300.0000 mg | Freq: Once | INTRAVENOUS | Status: AC
  Administered 2021-08-03: 300 mg via INTRAVENOUS
  Filled 2021-08-03: qty 15

## 2021-08-03 NOTE — Progress Notes (Signed)
Diagnosis: Iron Deficiency Anemia ? ?Provider:  Marshell Garfinkel, MD ? ?Procedure: Infusion ? ?IV Type: Peripheral, IV Location: R Antecubital ? ?Venofer (Iron Sucrose), Dose: 300 mg ? ?Infusion Start Time: 14.42 ? ?Infusion Stop Time: 16.21 ? ?Post Infusion IV Care: Peripheral IV Discontinued ? ?Discharge: Condition: Good, Destination: Home . AVS provided to patient.  ? ?Performed by:  Adylin Hankey, RN  ?  ?

## 2021-08-25 DIAGNOSIS — K6389 Other specified diseases of intestine: Secondary | ICD-10-CM | POA: Diagnosis not present

## 2021-08-26 ENCOUNTER — Telehealth: Payer: Self-pay | Admitting: Internal Medicine

## 2021-08-26 NOTE — Telephone Encounter (Signed)
.  Called patient to schedule appointment per 4/4 inbasket, patient is aware of date and time.   ?

## 2021-08-30 ENCOUNTER — Encounter: Payer: Self-pay | Admitting: Internal Medicine

## 2021-08-31 DIAGNOSIS — C171 Malignant neoplasm of jejunum: Secondary | ICD-10-CM | POA: Insufficient documentation

## 2021-09-03 ENCOUNTER — Encounter: Payer: Self-pay | Admitting: Internal Medicine

## 2021-09-03 ENCOUNTER — Other Ambulatory Visit: Payer: Self-pay

## 2021-09-03 ENCOUNTER — Inpatient Hospital Stay: Payer: Medicare Other | Attending: Internal Medicine

## 2021-09-03 ENCOUNTER — Other Ambulatory Visit: Payer: Self-pay | Admitting: *Deleted

## 2021-09-03 ENCOUNTER — Inpatient Hospital Stay: Payer: Medicare Other | Admitting: Internal Medicine

## 2021-09-03 ENCOUNTER — Encounter: Payer: Self-pay | Admitting: *Deleted

## 2021-09-03 DIAGNOSIS — C171 Malignant neoplasm of jejunum: Secondary | ICD-10-CM | POA: Insufficient documentation

## 2021-09-03 DIAGNOSIS — D72819 Decreased white blood cell count, unspecified: Secondary | ICD-10-CM | POA: Insufficient documentation

## 2021-09-03 DIAGNOSIS — R5383 Other fatigue: Secondary | ICD-10-CM | POA: Diagnosis not present

## 2021-09-03 DIAGNOSIS — R590 Localized enlarged lymph nodes: Secondary | ICD-10-CM | POA: Diagnosis not present

## 2021-09-03 DIAGNOSIS — Z888 Allergy status to other drugs, medicaments and biological substances status: Secondary | ICD-10-CM | POA: Diagnosis not present

## 2021-09-03 DIAGNOSIS — Z880 Allergy status to penicillin: Secondary | ICD-10-CM | POA: Insufficient documentation

## 2021-09-03 DIAGNOSIS — Z881 Allergy status to other antibiotic agents status: Secondary | ICD-10-CM | POA: Diagnosis not present

## 2021-09-03 DIAGNOSIS — D61818 Other pancytopenia: Secondary | ICD-10-CM | POA: Diagnosis not present

## 2021-09-03 DIAGNOSIS — D539 Nutritional anemia, unspecified: Secondary | ICD-10-CM

## 2021-09-03 DIAGNOSIS — Z8719 Personal history of other diseases of the digestive system: Secondary | ICD-10-CM | POA: Insufficient documentation

## 2021-09-03 DIAGNOSIS — Z87442 Personal history of urinary calculi: Secondary | ICD-10-CM | POA: Insufficient documentation

## 2021-09-03 DIAGNOSIS — Z79899 Other long term (current) drug therapy: Secondary | ICD-10-CM | POA: Insufficient documentation

## 2021-09-03 DIAGNOSIS — D509 Iron deficiency anemia, unspecified: Secondary | ICD-10-CM | POA: Diagnosis not present

## 2021-09-03 DIAGNOSIS — Z85828 Personal history of other malignant neoplasm of skin: Secondary | ICD-10-CM | POA: Diagnosis not present

## 2021-09-03 DIAGNOSIS — Z882 Allergy status to sulfonamides status: Secondary | ICD-10-CM | POA: Diagnosis not present

## 2021-09-03 LAB — CMP (CANCER CENTER ONLY)
ALT: 39 U/L (ref 0–44)
AST: 29 U/L (ref 15–41)
Albumin: 4.1 g/dL (ref 3.5–5.0)
Alkaline Phosphatase: 100 U/L (ref 38–126)
Anion gap: 5 (ref 5–15)
BUN: 18 mg/dL (ref 8–23)
CO2: 29 mmol/L (ref 22–32)
Calcium: 9 mg/dL (ref 8.9–10.3)
Chloride: 102 mmol/L (ref 98–111)
Creatinine: 0.59 mg/dL (ref 0.44–1.00)
GFR, Estimated: >60 mL/min (ref >60)
Glucose, Bld: 85 mg/dL (ref 70–99)
Potassium: 4.1 mmol/L (ref 3.5–5.1)
Sodium: 136 mmol/L (ref 135–145)
Total Bilirubin: 0.4 mg/dL (ref 0.3–1.2)
Total Protein: 7.1 g/dL (ref 6.5–8.1)

## 2021-09-03 LAB — IRON AND IRON BINDING CAPACITY (CC-WL,HP ONLY)
Iron: 74 ug/dL (ref 28–170)
Saturation Ratios: 21 % (ref 10.4–31.8)
TIBC: 360 ug/dL (ref 250–450)
UIBC: 286 ug/dL (ref 148–442)

## 2021-09-03 LAB — CBC WITH DIFFERENTIAL (CANCER CENTER ONLY)
Abs Immature Granulocytes: 0 10*3/uL (ref 0.00–0.07)
Basophils Absolute: 0 10*3/uL (ref 0.0–0.1)
Basophils Relative: 1 %
Eosinophils Absolute: 0.1 10*3/uL (ref 0.0–0.5)
Eosinophils Relative: 7 %
HCT: 33.5 % — ABNORMAL LOW (ref 36.0–46.0)
Hemoglobin: 10.7 g/dL — ABNORMAL LOW (ref 12.0–15.0)
Immature Granulocytes: 0 %
Lymphocytes Relative: 21 %
Lymphs Abs: 0.4 10*3/uL — ABNORMAL LOW (ref 0.7–4.0)
MCH: 29.6 pg (ref 26.0–34.0)
MCHC: 31.9 g/dL (ref 30.0–36.0)
MCV: 92.8 fL (ref 80.0–100.0)
Monocytes Absolute: 0.2 10*3/uL (ref 0.1–1.0)
Monocytes Relative: 10 %
Neutro Abs: 1.2 10*3/uL — ABNORMAL LOW (ref 1.7–7.7)
Neutrophils Relative %: 61 %
Platelet Count: 266 10*3/uL (ref 150–400)
RBC: 3.61 MIL/uL — ABNORMAL LOW (ref 3.87–5.11)
RDW: 18.4 % — ABNORMAL HIGH (ref 11.5–15.5)
WBC Count: 2 10*3/uL — ABNORMAL LOW (ref 4.0–10.5)
nRBC: 0 % (ref 0.0–0.2)

## 2021-09-03 LAB — FERRITIN: Ferritin: 152 ng/mL (ref 11–307)

## 2021-09-03 NOTE — Progress Notes (Signed)
PATIENT NAVIGATOR PROGRESS NOTE ? ?Name: Mary Leach ?Date: 09/03/2021 ?MRN: [id number]  ?DOB: 05/26/1950 ? ? ?Reason for visit:  ?Introductory phone call ? ?Comments:  Referral received from Dr Julien Nordmann.  New patient appt for next Friday 4/21 at 1:40 with Dr Benay Spice scheduled. ?Directions and parking instructions given as well as one support person and mask policy reviewed. ? ?She is having New patient consult with [facility] on 4/18. ? ?We discussed Port placement that will be needed for chemo and will proceed with scheduling at [facility] in the next few weeks. Patient verbalized agreement.   ? ?Given contact information and encouraged to call with any issues or questions ? ? ? ?Time spent counseling/coordinating care: 30-45 minutes ? ?

## 2021-09-03 NOTE — Progress Notes (Signed)
?    [place] ?[phone]   [phone] ? ?OFFICE PROGRESS NOTE ? ?Burney Gauze, FNP ?[address] ?[place] [address] ? 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 ?2) Pancytopenia of unclear etiology diagnosed in October 2020. ?3) iron deficiency anemia probably secondary to #1 ? ?PRIOR THERAPY: Enterectomy, resection of the small intestine single resection with anastomosis as well as excision of lesion of mesentery at [facility] under the care of Dr. Cristino Martes on August 13, 2021.  The final pathology showed invasive moderately differentiated adenocarcinoma of the jejunum with positive mesenteric lymph node ? ?CURRENT THERAPY: Observation.  She is expected to start adjuvant systemic chemotherapy in the next few weeks. ? ?INTERVAL HISTORY: ?Mary Leach 71 y.o. female returns to the clinic today for follow-up visit accompanied by her daughter Seth Bake.  The patient is feeling much better after the surgical resection and have a normal bowel movement now.  She denied having any current chest pain, shortness of breath, cough or hemoptysis.  She has no nausea, vomiting, diarrhea or constipation.  She denied having any recent weight loss or night sweats.  She has no fever or chills.  She underwent enterectomy with resection of the small intestine lesion and anastomosis as well as excision of lesion of the mesentery at [facility].  The patient is here today for evaluation and discussion of her treatment options. ? ? ?MEDICAL HISTORY: ?Past Medical History:  ?Diagnosis Date  ? Allergic rhinitis   ? Anxiety   ? Asthma   ? Bipolar disorder ([place])   ? 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 sees Dr.Lovick at [place]  ? Celiac disease   ? Depression   ? Elevated liver function tests 2019  ? Elevated testosterone level 2019  ? Elevated vitamin B12 level 2019  ? History of colonic  polyps   ? Hypothyroidism   ? Ileus following gastrointestinal surgery ([place]) 10/18/2011  ? Leukopenia   ? Low TSH level 2019  ? Malnutrition following gastrointestinal surgery 10/18/2011  ? Osteoporosis 2018  ? RBBB (right bundle branch block)   ? Renal stone 2020  ? ? ?ALLERGIES:  is allergic to food, gluten meal, soy allergy, augmentin [amoxicillin-pot clavulanate], chocolate, corn-containing products, other, sulfa antibiotics, sulfonamide derivatives, and tape. ? ?MEDICATIONS:  ?Current Outpatient Medications  ?Medication Sig Dispense Refill  ? ARMOUR THYROID 120 MG tablet Take 1 tablet by mouth daily.    ? cetirizine (ZYRTEC) 10 MG tablet Take 10 mg by mouth every evening.     ? cholecalciferol (VITAMIN D3) 25 MCG (1000 UNIT) tablet Take 1,000 Units by mouth daily. Per pt, takes "Synergy K" which has Vit D3. Unable to locate this medication.    ? FLUoxetine (PROZAC) 10 MG capsule Take 3 capsules (30 mg total) by mouth daily. Takes 3 tablets in AM 270 capsule 3  ? magnesium gluconate (MAGONATE) 500 MG tablet Take 1,500 mg by mouth every evening.     ? Menaquinone-7 (VITAMIN K2 PO) Take 1 tablet by mouth daily.    ? Multiple Minerals-Vitamins (NUTRA-SUPPORT BONE) CAPS Take 1-2 tablets by mouth See admin instructions. Take 1 tablet every morning and 2 tablets every night    ? Multiple Vitamin (MULTIVITAMIN) tablet Take 1 tablet by mouth daily. PURE Encapsulations    ? Multiple Vitamins-Minerals (ZINC PO) Take 1 tablet by mouth daily.    ? OVER THE COUNTER  MEDICATION Vitamin K1 and D3 mixed ?Takes 1 tablet daily    ? pantoprazole (PROTONIX) 40 MG tablet Take 40 mg by mouth every morning.    ? Probiotic Product (PROBIOTIC DAILY PO) Take by mouth. metaflora IB    ? vitamin C (ASCORBIC ACID) 500 MG tablet Take 500 mg by mouth daily.    ? vitamin E 1000 UNIT capsule Take 1,000 Units by mouth daily.    ? ?No current facility-administered medications for this visit.  ? ?Facility-Administered Medications Ordered in Other  Visits  ?Medication Dose Route Frequency Provider Last Rate Last Admin  ? 0.9 %  sodium chloride infusion   Intravenous Once PRN Curt Bears, MD      ? albuterol (VENTOLIN HFA) 108 (90 Base) MCG/ACT inhaler 2 puff  2 puff Inhalation Once PRN Curt Bears, MD      ? diphenhydrAMINE (BENADRYL) injection 50 mg  50 mg Intravenous Once PRN Curt Bears, MD      ? EPINEPHrine (EPI-PEN) injection 0.3 mg  0.3 mg Intramuscular Once PRN Curt Bears, MD      ? famotidine (PEPCID) IVPB 20 mg premix  20 mg Intravenous Once PRN Curt Bears, MD      ? methylPREDNISolone sodium succinate (SOLU-MEDROL) 125 mg/2 mL injection 125 mg  125 mg Intravenous Once PRN Curt Bears, MD      ? ? ?SURGICAL HISTORY:  ?Past Surgical History:  ?Procedure Laterality Date  ? CATARACT EXTRACTION Bilateral 11/2019  ? COLON SURGERY    ? COLONOSCOPY W/ POLYPECTOMY    ? COLOSTOMY TAKEDOWN  05/29/2012  ? Procedure: LAPAROSCOPIC COLOSTOMY TAKEDOWN;  Surgeon: Odis Hollingshead, MD;  Location: [facility];  Service: General;  Laterality: N/A;  Laparoscopic Assisted Colostomy Closure  with proctoscopy  ? CYSTECTOMY    ? LEFT FOOT, RIGHT HAND  ? DILATATION & CURETTAGE/HYSTEROSCOPY WITH MYOSURE N/A 07/25/2018  ? Procedure: DILATATION & CURETTAGE/HYSTEROSCOPY WITH MYOSURE RESECTION OF ENDOMETRIAL AND ENDOCERVICAL POLYP;  Surgeon: Nunzio Cobbs, MD;  Location: [facility];  Service: Gynecology;  Laterality: N/A;  ? FOOT SURGERY    ? FOOT SURGERY Left 12/07/2018  ? cyst removed  ? LAPAROTOMY  10/07/2011  ? Procedure: EXPLORATORY LAPAROTOMY;  Surgeon: Odis Hollingshead, MD;  Location: [facility];  Service: General;  Laterality: N/A;  hartmann procedure and creation of colostomy  ? NASAL SEPTOPLASTY W/ TURBINOPLASTY Bilateral 11/11/2015  ? Procedure: SEPTOPLASTY BILATERAL TURBINATE  REDUCTION ;  Surgeon: Leta Baptist, MD;  Location: [place];  Service: ENT;  Laterality: Bilateral;  ? NASAL SINUS SURGERY Bilateral  11/11/2015  ? Procedure: BILATERAL ENDOSCOPIC  MAXILLARY ANTROSTOMY;  Surgeon: Leta Baptist, MD;  Location: [place];  Service: ENT;  Laterality: Bilateral;  ? SKIN BIOPSY Right 06/04/2019  ? basal cell carcinoma , nodular pattern  ? TONSILLECTOMY AND ADENOIDECTOMY    ? TUBAL LIGATION    ? UPPER GASTROINTESTINAL ENDOSCOPY  01/19/12  ? ? ?REVIEW OF SYSTEMS:  Constitutional: positive for fatigue ?Eyes: negative ?Ears, nose, mouth, throat, and face: negative ?Respiratory: negative ?Cardiovascular: negative ?Gastrointestinal: negative ?Genitourinary:negative ?Integument/breast: negative ?Hematologic/lymphatic: negative ?Musculoskeletal:negative ?Neurological: negative ?Behavioral/Psych: negative ?Endocrine: negative ?Allergic/Immunologic: negative  ? ?PHYSICAL EXAMINATION: General appearance: alert, cooperative, fatigued, and no distress ?Head: Normocephalic, without obvious abnormality, atraumatic ?Neck: no adenopathy, no JVD, supple, symmetrical, trachea midline, and thyroid not enlarged, symmetric, no tenderness/mass/nodules ?Lymph nodes: Cervical, supraclavicular, and axillary nodes normal. ?Resp: clear to auscultation bilaterally ?Back: symmetric, no curvature. ROM normal. No CVA  tenderness. ?Cardio: regular rate and rhythm, S1, S2 normal, no murmur, click, rub or gallop ?GI: soft, non-tender; bowel sounds normal; no masses,  no organomegaly ?Extremities: extremities normal, atraumatic, no cyanosis or edema ?Neurologic: Alert and oriented X 3, normal strength and tone. Normal symmetric reflexes. Normal coordination and gait ? ?ECOG PERFORMANCE STATUS: 1 - Symptomatic but completely ambulatory ? ?Blood pressure (!) 144/88, pulse 70, temperature (!) 97.3 ?F (36.3 ?C), temperature source Tympanic, resp. rate 18, height 5' 3"  (1.6 m), weight 124 lb 1.6 oz (56.3 kg), last menstrual period 05/24/2006, SpO2 100 %. ? ?LABORATORY DATA: ?Lab Results  ?Component Value Date  ? WBC 2.2 (L) 07/09/2021  ? HGB 10.4 (L)  07/09/2021  ? HCT 31.6 (L) 07/09/2021  ? MCV 86.1 07/09/2021  ? PLT 246 07/09/2021  ? ? ?  Chemistry   ?   ?Component Value Date/Time  ? NA 137 07/09/2021 0950  ? NA 134 (A) 07/22/2018 0000  ? K 4.2 02/1

## 2021-09-08 DIAGNOSIS — D509 Iron deficiency anemia, unspecified: Secondary | ICD-10-CM | POA: Diagnosis not present

## 2021-09-08 DIAGNOSIS — E7210 Disorders of sulfur-bearing amino-acid metabolism, unspecified: Secondary | ICD-10-CM | POA: Diagnosis not present

## 2021-09-08 DIAGNOSIS — C179 Malignant neoplasm of small intestine, unspecified: Secondary | ICD-10-CM | POA: Diagnosis not present

## 2021-09-08 DIAGNOSIS — C171 Malignant neoplasm of jejunum: Secondary | ICD-10-CM | POA: Diagnosis not present

## 2021-09-08 DIAGNOSIS — K2100 Gastro-esophageal reflux disease with esophagitis, without bleeding: Secondary | ICD-10-CM | POA: Diagnosis not present

## 2021-09-10 ENCOUNTER — Encounter: Payer: Self-pay | Admitting: *Deleted

## 2021-09-10 NOTE — Progress Notes (Signed)
Spoke with Mary Leach, she is going to have her Oncology care at [facility] since her support system lives in that area.   ? ?Appts for consult and PAC placement canceled per her request.   ?

## 2021-09-11 ENCOUNTER — Inpatient Hospital Stay: Payer: Medicare Other | Admitting: Oncology

## 2021-09-13 DIAGNOSIS — T781XXA Other adverse food reactions, not elsewhere classified, initial encounter: Secondary | ICD-10-CM | POA: Insufficient documentation

## 2021-09-16 ENCOUNTER — Other Ambulatory Visit (HOSPITAL_COMMUNITY): Payer: Medicare Other

## 2021-09-16 ENCOUNTER — Ambulatory Visit (HOSPITAL_COMMUNITY): Payer: Medicare Other

## 2021-09-16 DIAGNOSIS — D509 Iron deficiency anemia, unspecified: Secondary | ICD-10-CM | POA: Diagnosis not present

## 2021-09-16 DIAGNOSIS — E7210 Disorders of sulfur-bearing amino-acid metabolism, unspecified: Secondary | ICD-10-CM | POA: Diagnosis not present

## 2021-09-16 DIAGNOSIS — T781XXD Other adverse food reactions, not elsewhere classified, subsequent encounter: Secondary | ICD-10-CM | POA: Diagnosis not present

## 2021-09-17 ENCOUNTER — Telehealth: Payer: Self-pay | Admitting: Medical Oncology

## 2021-09-17 DIAGNOSIS — Z9049 Acquired absence of other specified parts of digestive tract: Secondary | ICD-10-CM | POA: Diagnosis not present

## 2021-09-17 DIAGNOSIS — C171 Malignant neoplasm of jejunum: Secondary | ICD-10-CM | POA: Diagnosis not present

## 2021-09-17 DIAGNOSIS — C772 Secondary and unspecified malignant neoplasm of intra-abdominal lymph nodes: Secondary | ICD-10-CM | POA: Diagnosis not present

## 2021-09-17 DIAGNOSIS — C179 Malignant neoplasm of small intestine, unspecified: Secondary | ICD-10-CM | POA: Diagnosis not present

## 2021-09-17 NOTE — Telephone Encounter (Signed)
Requests to speak to Dr. Julien Nordmann tomorrow re: pt various cytopenia's . ?

## 2021-09-18 ENCOUNTER — Encounter: Payer: Self-pay | Admitting: Internal Medicine

## 2021-09-18 DIAGNOSIS — Z933 Colostomy status: Secondary | ICD-10-CM | POA: Diagnosis not present

## 2021-09-18 DIAGNOSIS — C179 Malignant neoplasm of small intestine, unspecified: Secondary | ICD-10-CM | POA: Diagnosis not present

## 2021-09-18 DIAGNOSIS — K6389 Other specified diseases of intestine: Secondary | ICD-10-CM | POA: Diagnosis not present

## 2021-09-21 DIAGNOSIS — Z808 Family history of malignant neoplasm of other organs or systems: Secondary | ICD-10-CM | POA: Diagnosis not present

## 2021-09-21 DIAGNOSIS — C772 Secondary and unspecified malignant neoplasm of intra-abdominal lymph nodes: Secondary | ICD-10-CM | POA: Diagnosis not present

## 2021-09-21 DIAGNOSIS — C171 Malignant neoplasm of jejunum: Secondary | ICD-10-CM | POA: Diagnosis not present

## 2021-09-21 DIAGNOSIS — Z9049 Acquired absence of other specified parts of digestive tract: Secondary | ICD-10-CM | POA: Diagnosis not present

## 2021-09-21 DIAGNOSIS — N343 Urethral syndrome, unspecified: Secondary | ICD-10-CM | POA: Diagnosis not present

## 2021-09-21 DIAGNOSIS — Z79899 Other long term (current) drug therapy: Secondary | ICD-10-CM | POA: Diagnosis not present

## 2021-09-21 DIAGNOSIS — Z5111 Encounter for antineoplastic chemotherapy: Secondary | ICD-10-CM | POA: Diagnosis not present

## 2021-09-24 DIAGNOSIS — C171 Malignant neoplasm of jejunum: Secondary | ICD-10-CM | POA: Diagnosis not present

## 2021-09-24 DIAGNOSIS — R897 Abnormal histological findings in specimens from other organs, systems and tissues: Secondary | ICD-10-CM | POA: Diagnosis not present

## 2021-10-06 ENCOUNTER — Other Ambulatory Visit: Payer: Medicare Other

## 2021-10-06 ENCOUNTER — Ambulatory Visit: Payer: Medicare Other | Admitting: Internal Medicine

## 2021-10-06 DIAGNOSIS — Z5111 Encounter for antineoplastic chemotherapy: Secondary | ICD-10-CM | POA: Diagnosis not present

## 2021-10-06 DIAGNOSIS — C171 Malignant neoplasm of jejunum: Secondary | ICD-10-CM | POA: Diagnosis not present

## 2021-10-09 DIAGNOSIS — C171 Malignant neoplasm of jejunum: Secondary | ICD-10-CM | POA: Diagnosis not present

## 2021-10-20 DIAGNOSIS — C171 Malignant neoplasm of jejunum: Secondary | ICD-10-CM | POA: Diagnosis not present

## 2021-10-20 DIAGNOSIS — Z79899 Other long term (current) drug therapy: Secondary | ICD-10-CM | POA: Diagnosis not present

## 2021-10-20 DIAGNOSIS — Z5111 Encounter for antineoplastic chemotherapy: Secondary | ICD-10-CM | POA: Diagnosis not present

## 2021-10-23 DIAGNOSIS — C171 Malignant neoplasm of jejunum: Secondary | ICD-10-CM | POA: Diagnosis not present

## 2021-11-02 DIAGNOSIS — H401221 Low-tension glaucoma, left eye, mild stage: Secondary | ICD-10-CM | POA: Diagnosis not present

## 2021-11-02 DIAGNOSIS — H43813 Vitreous degeneration, bilateral: Secondary | ICD-10-CM | POA: Diagnosis not present

## 2021-11-02 DIAGNOSIS — H401212 Low-tension glaucoma, right eye, moderate stage: Secondary | ICD-10-CM | POA: Diagnosis not present

## 2021-11-02 DIAGNOSIS — H04123 Dry eye syndrome of bilateral lacrimal glands: Secondary | ICD-10-CM | POA: Diagnosis not present

## 2021-11-03 DIAGNOSIS — C171 Malignant neoplasm of jejunum: Secondary | ICD-10-CM | POA: Diagnosis not present

## 2021-11-03 DIAGNOSIS — D72819 Decreased white blood cell count, unspecified: Secondary | ICD-10-CM | POA: Diagnosis not present

## 2021-11-03 DIAGNOSIS — Z5111 Encounter for antineoplastic chemotherapy: Secondary | ICD-10-CM | POA: Diagnosis not present

## 2021-11-03 DIAGNOSIS — Z79899 Other long term (current) drug therapy: Secondary | ICD-10-CM | POA: Diagnosis not present

## 2021-11-03 DIAGNOSIS — Z808 Family history of malignant neoplasm of other organs or systems: Secondary | ICD-10-CM | POA: Diagnosis not present

## 2021-11-03 DIAGNOSIS — D509 Iron deficiency anemia, unspecified: Secondary | ICD-10-CM | POA: Diagnosis not present

## 2021-11-03 DIAGNOSIS — E039 Hypothyroidism, unspecified: Secondary | ICD-10-CM | POA: Diagnosis not present

## 2021-11-03 DIAGNOSIS — C786 Secondary malignant neoplasm of retroperitoneum and peritoneum: Secondary | ICD-10-CM | POA: Diagnosis not present

## 2021-11-03 DIAGNOSIS — K900 Celiac disease: Secondary | ICD-10-CM | POA: Diagnosis not present

## 2021-11-06 DIAGNOSIS — C171 Malignant neoplasm of jejunum: Secondary | ICD-10-CM | POA: Diagnosis not present

## 2021-11-17 DIAGNOSIS — Z79899 Other long term (current) drug therapy: Secondary | ICD-10-CM | POA: Diagnosis not present

## 2021-11-17 DIAGNOSIS — Z5111 Encounter for antineoplastic chemotherapy: Secondary | ICD-10-CM | POA: Diagnosis not present

## 2021-11-17 DIAGNOSIS — C772 Secondary and unspecified malignant neoplasm of intra-abdominal lymph nodes: Secondary | ICD-10-CM | POA: Diagnosis not present

## 2021-11-17 DIAGNOSIS — C171 Malignant neoplasm of jejunum: Secondary | ICD-10-CM | POA: Diagnosis not present

## 2021-11-20 DIAGNOSIS — C171 Malignant neoplasm of jejunum: Secondary | ICD-10-CM | POA: Diagnosis not present

## 2021-12-01 DIAGNOSIS — C171 Malignant neoplasm of jejunum: Secondary | ICD-10-CM | POA: Diagnosis not present

## 2021-12-01 DIAGNOSIS — Z451 Encounter for adjustment and management of infusion pump: Secondary | ICD-10-CM | POA: Diagnosis not present

## 2021-12-01 DIAGNOSIS — Z5111 Encounter for antineoplastic chemotherapy: Secondary | ICD-10-CM | POA: Diagnosis not present

## 2021-12-01 DIAGNOSIS — C179 Malignant neoplasm of small intestine, unspecified: Secondary | ICD-10-CM | POA: Diagnosis not present

## 2021-12-04 DIAGNOSIS — C171 Malignant neoplasm of jejunum: Secondary | ICD-10-CM | POA: Diagnosis not present

## 2021-12-08 DIAGNOSIS — E611 Iron deficiency: Secondary | ICD-10-CM | POA: Diagnosis not present

## 2021-12-08 DIAGNOSIS — E038 Other specified hypothyroidism: Secondary | ICD-10-CM | POA: Diagnosis not present

## 2021-12-08 DIAGNOSIS — E559 Vitamin D deficiency, unspecified: Secondary | ICD-10-CM | POA: Diagnosis not present

## 2021-12-08 DIAGNOSIS — E7210 Disorders of sulfur-bearing amino-acid metabolism, unspecified: Secondary | ICD-10-CM | POA: Diagnosis not present

## 2021-12-10 DIAGNOSIS — Z85828 Personal history of other malignant neoplasm of skin: Secondary | ICD-10-CM | POA: Diagnosis not present

## 2021-12-10 DIAGNOSIS — L438 Other lichen planus: Secondary | ICD-10-CM | POA: Diagnosis not present

## 2021-12-10 DIAGNOSIS — D1801 Hemangioma of skin and subcutaneous tissue: Secondary | ICD-10-CM | POA: Diagnosis not present

## 2021-12-10 DIAGNOSIS — L814 Other melanin hyperpigmentation: Secondary | ICD-10-CM | POA: Diagnosis not present

## 2021-12-15 DIAGNOSIS — Z9221 Personal history of antineoplastic chemotherapy: Secondary | ICD-10-CM | POA: Diagnosis not present

## 2021-12-15 DIAGNOSIS — C171 Malignant neoplasm of jejunum: Secondary | ICD-10-CM | POA: Diagnosis not present

## 2021-12-15 DIAGNOSIS — C179 Malignant neoplasm of small intestine, unspecified: Secondary | ICD-10-CM | POA: Diagnosis not present

## 2021-12-15 DIAGNOSIS — Z85068 Personal history of other malignant neoplasm of small intestine: Secondary | ICD-10-CM | POA: Diagnosis not present

## 2021-12-15 DIAGNOSIS — R9389 Abnormal findings on diagnostic imaging of other specified body structures: Secondary | ICD-10-CM | POA: Diagnosis not present

## 2021-12-15 DIAGNOSIS — Z08 Encounter for follow-up examination after completed treatment for malignant neoplasm: Secondary | ICD-10-CM | POA: Diagnosis not present

## 2022-01-12 DIAGNOSIS — C179 Malignant neoplasm of small intestine, unspecified: Secondary | ICD-10-CM | POA: Diagnosis not present

## 2022-01-14 DIAGNOSIS — E7210 Disorders of sulfur-bearing amino-acid metabolism, unspecified: Secondary | ICD-10-CM | POA: Diagnosis not present

## 2022-01-14 DIAGNOSIS — Z933 Colostomy status: Secondary | ICD-10-CM | POA: Diagnosis not present

## 2022-01-14 DIAGNOSIS — Z8719 Personal history of other diseases of the digestive system: Secondary | ICD-10-CM | POA: Diagnosis not present

## 2022-01-14 DIAGNOSIS — E559 Vitamin D deficiency, unspecified: Secondary | ICD-10-CM | POA: Diagnosis not present

## 2022-01-14 DIAGNOSIS — C171 Malignant neoplasm of jejunum: Secondary | ICD-10-CM | POA: Diagnosis not present

## 2022-01-14 DIAGNOSIS — E538 Deficiency of other specified B group vitamins: Secondary | ICD-10-CM | POA: Diagnosis not present

## 2022-01-14 DIAGNOSIS — E039 Hypothyroidism, unspecified: Secondary | ICD-10-CM | POA: Diagnosis not present

## 2022-02-04 DIAGNOSIS — D649 Anemia, unspecified: Secondary | ICD-10-CM | POA: Diagnosis not present

## 2022-02-04 DIAGNOSIS — D696 Thrombocytopenia, unspecified: Secondary | ICD-10-CM | POA: Diagnosis not present

## 2022-02-04 DIAGNOSIS — R140 Abdominal distension (gaseous): Secondary | ICD-10-CM | POA: Diagnosis not present

## 2022-02-04 DIAGNOSIS — R748 Abnormal levels of other serum enzymes: Secondary | ICD-10-CM | POA: Diagnosis not present

## 2022-02-04 DIAGNOSIS — K9049 Malabsorption due to intolerance, not elsewhere classified: Secondary | ICD-10-CM | POA: Diagnosis not present

## 2022-02-04 DIAGNOSIS — Z9049 Acquired absence of other specified parts of digestive tract: Secondary | ICD-10-CM | POA: Diagnosis not present

## 2022-02-04 DIAGNOSIS — R5382 Chronic fatigue, unspecified: Secondary | ICD-10-CM | POA: Diagnosis not present

## 2022-02-15 DIAGNOSIS — Z85828 Personal history of other malignant neoplasm of skin: Secondary | ICD-10-CM | POA: Diagnosis not present

## 2022-02-15 DIAGNOSIS — L249 Irritant contact dermatitis, unspecified cause: Secondary | ICD-10-CM | POA: Diagnosis not present

## 2022-02-18 DIAGNOSIS — U071 COVID-19: Secondary | ICD-10-CM | POA: Diagnosis not present

## 2022-03-16 DIAGNOSIS — C171 Malignant neoplasm of jejunum: Secondary | ICD-10-CM | POA: Diagnosis not present

## 2022-03-16 DIAGNOSIS — Z9049 Acquired absence of other specified parts of digestive tract: Secondary | ICD-10-CM | POA: Diagnosis not present

## 2022-03-16 DIAGNOSIS — R911 Solitary pulmonary nodule: Secondary | ICD-10-CM | POA: Diagnosis not present

## 2022-03-16 DIAGNOSIS — Z85068 Personal history of other malignant neoplasm of small intestine: Secondary | ICD-10-CM | POA: Diagnosis not present

## 2022-03-16 DIAGNOSIS — Z08 Encounter for follow-up examination after completed treatment for malignant neoplasm: Secondary | ICD-10-CM | POA: Diagnosis not present

## 2022-03-16 DIAGNOSIS — D72819 Decreased white blood cell count, unspecified: Secondary | ICD-10-CM | POA: Diagnosis not present

## 2022-03-22 DIAGNOSIS — H401221 Low-tension glaucoma, left eye, mild stage: Secondary | ICD-10-CM | POA: Diagnosis not present

## 2022-03-22 DIAGNOSIS — H401212 Low-tension glaucoma, right eye, moderate stage: Secondary | ICD-10-CM | POA: Diagnosis not present

## 2022-03-22 DIAGNOSIS — H04123 Dry eye syndrome of bilateral lacrimal glands: Secondary | ICD-10-CM | POA: Diagnosis not present

## 2022-03-22 DIAGNOSIS — H43813 Vitreous degeneration, bilateral: Secondary | ICD-10-CM | POA: Diagnosis not present

## 2022-04-01 DIAGNOSIS — L821 Other seborrheic keratosis: Secondary | ICD-10-CM | POA: Diagnosis not present

## 2022-04-01 DIAGNOSIS — L820 Inflamed seborrheic keratosis: Secondary | ICD-10-CM | POA: Diagnosis not present

## 2022-04-01 DIAGNOSIS — Z85828 Personal history of other malignant neoplasm of skin: Secondary | ICD-10-CM | POA: Diagnosis not present

## 2022-04-21 DIAGNOSIS — Z780 Asymptomatic menopausal state: Secondary | ICD-10-CM | POA: Diagnosis not present

## 2022-04-21 DIAGNOSIS — Z1330 Encounter for screening examination for mental health and behavioral disorders, unspecified: Secondary | ICD-10-CM | POA: Diagnosis not present

## 2022-04-21 DIAGNOSIS — R739 Hyperglycemia, unspecified: Secondary | ICD-10-CM | POA: Diagnosis not present

## 2022-04-21 DIAGNOSIS — Z0000 Encounter for general adult medical examination without abnormal findings: Secondary | ICD-10-CM | POA: Diagnosis not present

## 2022-04-21 DIAGNOSIS — Z23 Encounter for immunization: Secondary | ICD-10-CM | POA: Diagnosis not present

## 2022-04-21 DIAGNOSIS — Z1331 Encounter for screening for depression: Secondary | ICD-10-CM | POA: Diagnosis not present

## 2022-04-21 DIAGNOSIS — C171 Malignant neoplasm of jejunum: Secondary | ICD-10-CM | POA: Diagnosis not present

## 2022-04-21 DIAGNOSIS — Z136 Encounter for screening for cardiovascular disorders: Secondary | ICD-10-CM | POA: Diagnosis not present

## 2022-06-24 DIAGNOSIS — R739 Hyperglycemia, unspecified: Secondary | ICD-10-CM | POA: Diagnosis not present

## 2022-06-24 DIAGNOSIS — Z08 Encounter for follow-up examination after completed treatment for malignant neoplasm: Secondary | ICD-10-CM | POA: Diagnosis not present

## 2022-06-24 DIAGNOSIS — R911 Solitary pulmonary nodule: Secondary | ICD-10-CM | POA: Diagnosis not present

## 2022-06-24 DIAGNOSIS — C171 Malignant neoplasm of jejunum: Secondary | ICD-10-CM | POA: Diagnosis not present

## 2022-06-24 DIAGNOSIS — D72819 Decreased white blood cell count, unspecified: Secondary | ICD-10-CM | POA: Diagnosis not present

## 2022-06-24 DIAGNOSIS — Z85028 Personal history of other malignant neoplasm of stomach: Secondary | ICD-10-CM | POA: Diagnosis not present

## 2022-06-24 DIAGNOSIS — Z9221 Personal history of antineoplastic chemotherapy: Secondary | ICD-10-CM | POA: Diagnosis not present

## 2022-06-24 DIAGNOSIS — R0989 Other specified symptoms and signs involving the circulatory and respiratory systems: Secondary | ICD-10-CM | POA: Diagnosis not present

## 2022-06-29 DIAGNOSIS — K080 Exfoliation of teeth due to systemic causes: Secondary | ICD-10-CM | POA: Diagnosis not present

## 2022-07-11 DIAGNOSIS — J0101 Acute recurrent maxillary sinusitis: Secondary | ICD-10-CM | POA: Diagnosis not present

## 2022-07-28 DIAGNOSIS — E039 Hypothyroidism, unspecified: Secondary | ICD-10-CM | POA: Diagnosis not present

## 2022-07-28 DIAGNOSIS — M79675 Pain in left toe(s): Secondary | ICD-10-CM | POA: Diagnosis not present

## 2022-07-28 DIAGNOSIS — J0101 Acute recurrent maxillary sinusitis: Secondary | ICD-10-CM | POA: Diagnosis not present

## 2022-08-02 DIAGNOSIS — H401212 Low-tension glaucoma, right eye, moderate stage: Secondary | ICD-10-CM | POA: Diagnosis not present

## 2022-08-02 DIAGNOSIS — H401221 Low-tension glaucoma, left eye, mild stage: Secondary | ICD-10-CM | POA: Diagnosis not present

## 2022-08-02 DIAGNOSIS — H04123 Dry eye syndrome of bilateral lacrimal glands: Secondary | ICD-10-CM | POA: Diagnosis not present

## 2022-08-02 DIAGNOSIS — H43813 Vitreous degeneration, bilateral: Secondary | ICD-10-CM | POA: Diagnosis not present

## 2022-08-13 DIAGNOSIS — C171 Malignant neoplasm of jejunum: Secondary | ICD-10-CM | POA: Diagnosis not present

## 2022-09-16 DIAGNOSIS — Z9049 Acquired absence of other specified parts of digestive tract: Secondary | ICD-10-CM | POA: Diagnosis not present

## 2022-09-16 DIAGNOSIS — R140 Abdominal distension (gaseous): Secondary | ICD-10-CM | POA: Diagnosis not present

## 2022-10-11 DIAGNOSIS — C241 Malignant neoplasm of ampulla of Vater: Secondary | ICD-10-CM | POA: Diagnosis not present

## 2022-10-11 DIAGNOSIS — C171 Malignant neoplasm of jejunum: Secondary | ICD-10-CM | POA: Diagnosis not present

## 2022-10-12 DIAGNOSIS — C171 Malignant neoplasm of jejunum: Secondary | ICD-10-CM | POA: Diagnosis not present

## 2022-10-12 DIAGNOSIS — Z08 Encounter for follow-up examination after completed treatment for malignant neoplasm: Secondary | ICD-10-CM | POA: Diagnosis not present

## 2022-10-12 DIAGNOSIS — D72819 Decreased white blood cell count, unspecified: Secondary | ICD-10-CM | POA: Diagnosis not present

## 2022-10-12 DIAGNOSIS — Z85068 Personal history of other malignant neoplasm of small intestine: Secondary | ICD-10-CM | POA: Diagnosis not present

## 2022-10-12 DIAGNOSIS — R739 Hyperglycemia, unspecified: Secondary | ICD-10-CM | POA: Diagnosis not present

## 2022-10-12 DIAGNOSIS — Z452 Encounter for adjustment and management of vascular access device: Secondary | ICD-10-CM | POA: Diagnosis not present

## 2022-10-26 DIAGNOSIS — K080 Exfoliation of teeth due to systemic causes: Secondary | ICD-10-CM | POA: Diagnosis not present

## 2022-12-22 DIAGNOSIS — B9689 Other specified bacterial agents as the cause of diseases classified elsewhere: Secondary | ICD-10-CM | POA: Diagnosis not present

## 2022-12-22 DIAGNOSIS — J019 Acute sinusitis, unspecified: Secondary | ICD-10-CM | POA: Diagnosis not present

## 2023-01-25 DIAGNOSIS — R739 Hyperglycemia, unspecified: Secondary | ICD-10-CM | POA: Diagnosis not present

## 2023-01-25 DIAGNOSIS — D72819 Decreased white blood cell count, unspecified: Secondary | ICD-10-CM | POA: Diagnosis not present

## 2023-01-25 DIAGNOSIS — C171 Malignant neoplasm of jejunum: Secondary | ICD-10-CM | POA: Diagnosis not present

## 2023-01-25 DIAGNOSIS — C772 Secondary and unspecified malignant neoplasm of intra-abdominal lymph nodes: Secondary | ICD-10-CM | POA: Diagnosis not present

## 2023-02-07 DIAGNOSIS — Z85068 Personal history of other malignant neoplasm of small intestine: Secondary | ICD-10-CM | POA: Diagnosis not present

## 2023-02-14 DIAGNOSIS — L821 Other seborrheic keratosis: Secondary | ICD-10-CM | POA: Diagnosis not present

## 2023-02-14 DIAGNOSIS — Z85828 Personal history of other malignant neoplasm of skin: Secondary | ICD-10-CM | POA: Diagnosis not present

## 2023-02-14 DIAGNOSIS — I788 Other diseases of capillaries: Secondary | ICD-10-CM | POA: Diagnosis not present

## 2023-02-14 DIAGNOSIS — L814 Other melanin hyperpigmentation: Secondary | ICD-10-CM | POA: Diagnosis not present

## 2023-02-28 DIAGNOSIS — E039 Hypothyroidism, unspecified: Secondary | ICD-10-CM | POA: Diagnosis not present

## 2023-02-28 DIAGNOSIS — F325 Major depressive disorder, single episode, in full remission: Secondary | ICD-10-CM | POA: Diagnosis not present

## 2023-02-28 DIAGNOSIS — Z008 Encounter for other general examination: Secondary | ICD-10-CM | POA: Diagnosis not present

## 2023-02-28 DIAGNOSIS — K219 Gastro-esophageal reflux disease without esophagitis: Secondary | ICD-10-CM | POA: Diagnosis not present

## 2023-02-28 DIAGNOSIS — D529 Folate deficiency anemia, unspecified: Secondary | ICD-10-CM | POA: Diagnosis not present

## 2023-03-08 DIAGNOSIS — K080 Exfoliation of teeth due to systemic causes: Secondary | ICD-10-CM | POA: Diagnosis not present

## 2023-04-11 DIAGNOSIS — H401221 Low-tension glaucoma, left eye, mild stage: Secondary | ICD-10-CM | POA: Diagnosis not present

## 2023-04-11 DIAGNOSIS — H401212 Low-tension glaucoma, right eye, moderate stage: Secondary | ICD-10-CM | POA: Diagnosis not present

## 2023-04-11 DIAGNOSIS — H0102A Squamous blepharitis right eye, upper and lower eyelids: Secondary | ICD-10-CM | POA: Diagnosis not present

## 2023-04-11 DIAGNOSIS — H04123 Dry eye syndrome of bilateral lacrimal glands: Secondary | ICD-10-CM | POA: Diagnosis not present

## 2023-04-28 DIAGNOSIS — C171 Malignant neoplasm of jejunum: Secondary | ICD-10-CM | POA: Diagnosis not present

## 2023-04-28 DIAGNOSIS — E039 Hypothyroidism, unspecified: Secondary | ICD-10-CM | POA: Diagnosis not present

## 2023-04-28 DIAGNOSIS — Z0000 Encounter for general adult medical examination without abnormal findings: Secondary | ICD-10-CM | POA: Diagnosis not present

## 2023-04-28 DIAGNOSIS — Z136 Encounter for screening for cardiovascular disorders: Secondary | ICD-10-CM | POA: Diagnosis not present

## 2023-04-28 DIAGNOSIS — J45909 Unspecified asthma, uncomplicated: Secondary | ICD-10-CM | POA: Diagnosis not present

## 2023-04-28 DIAGNOSIS — Z9049 Acquired absence of other specified parts of digestive tract: Secondary | ICD-10-CM | POA: Diagnosis not present

## 2023-04-28 DIAGNOSIS — Z85068 Personal history of other malignant neoplasm of small intestine: Secondary | ICD-10-CM | POA: Diagnosis not present

## 2023-05-09 ENCOUNTER — Encounter (INDEPENDENT_AMBULATORY_CARE_PROVIDER_SITE_OTHER): Payer: Self-pay

## 2023-05-09 DIAGNOSIS — Z719 Counseling, unspecified: Secondary | ICD-10-CM

## 2023-05-10 DIAGNOSIS — R911 Solitary pulmonary nodule: Secondary | ICD-10-CM | POA: Diagnosis not present

## 2023-05-10 DIAGNOSIS — Z08 Encounter for follow-up examination after completed treatment for malignant neoplasm: Secondary | ICD-10-CM | POA: Diagnosis not present

## 2023-05-10 DIAGNOSIS — C189 Malignant neoplasm of colon, unspecified: Secondary | ICD-10-CM | POA: Diagnosis not present

## 2023-05-10 DIAGNOSIS — C171 Malignant neoplasm of jejunum: Secondary | ICD-10-CM | POA: Diagnosis not present

## 2023-05-10 DIAGNOSIS — R739 Hyperglycemia, unspecified: Secondary | ICD-10-CM | POA: Diagnosis not present

## 2023-05-10 DIAGNOSIS — E039 Hypothyroidism, unspecified: Secondary | ICD-10-CM | POA: Diagnosis not present

## 2023-05-10 DIAGNOSIS — D72819 Decreased white blood cell count, unspecified: Secondary | ICD-10-CM | POA: Diagnosis not present

## 2023-05-10 DIAGNOSIS — R9389 Abnormal findings on diagnostic imaging of other specified body structures: Secondary | ICD-10-CM | POA: Diagnosis not present

## 2023-05-10 DIAGNOSIS — D709 Neutropenia, unspecified: Secondary | ICD-10-CM | POA: Diagnosis not present

## 2023-05-10 DIAGNOSIS — Z85068 Personal history of other malignant neoplasm of small intestine: Secondary | ICD-10-CM | POA: Diagnosis not present

## 2023-05-20 ENCOUNTER — Telehealth: Payer: Self-pay

## 2023-05-20 NOTE — Telephone Encounter (Signed)
Called pt and she agreed to Tuesday 05-24-23 at [facility]

## 2023-05-24 ENCOUNTER — Encounter (INDEPENDENT_AMBULATORY_CARE_PROVIDER_SITE_OTHER): Payer: Self-pay

## 2023-05-24 DIAGNOSIS — Z719 Counseling, unspecified: Secondary | ICD-10-CM

## 2023-06-17 DIAGNOSIS — R92323 Mammographic fibroglandular density, bilateral breasts: Secondary | ICD-10-CM | POA: Diagnosis not present

## 2023-06-17 DIAGNOSIS — Z1231 Encounter for screening mammogram for malignant neoplasm of breast: Secondary | ICD-10-CM | POA: Diagnosis not present

## 2023-06-17 LAB — HM MAMMOGRAPHY

## 2023-06-29 DIAGNOSIS — K080 Exfoliation of teeth due to systemic causes: Secondary | ICD-10-CM | POA: Diagnosis not present

## 2023-07-11 ENCOUNTER — Telehealth: Payer: Self-pay

## 2023-07-11 DIAGNOSIS — Z719 Counseling, unspecified: Secondary | ICD-10-CM

## 2023-07-11 NOTE — Telephone Encounter (Signed)
LVM TO COME IN EARLY PER LILY

## 2023-07-25 ENCOUNTER — Other Ambulatory Visit: Payer: Self-pay | Admitting: Podiatry

## 2023-07-25 ENCOUNTER — Ambulatory Visit (INDEPENDENT_AMBULATORY_CARE_PROVIDER_SITE_OTHER)

## 2023-07-25 ENCOUNTER — Encounter: Payer: Self-pay | Admitting: Podiatry

## 2023-07-25 ENCOUNTER — Ambulatory Visit: Payer: Self-pay | Admitting: Podiatry

## 2023-07-25 DIAGNOSIS — Z719 Counseling, unspecified: Secondary | ICD-10-CM

## 2023-07-25 DIAGNOSIS — R195 Other fecal abnormalities: Secondary | ICD-10-CM | POA: Insufficient documentation

## 2023-07-25 DIAGNOSIS — R634 Abnormal weight loss: Secondary | ICD-10-CM | POA: Insufficient documentation

## 2023-07-25 DIAGNOSIS — M2042 Other hammer toe(s) (acquired), left foot: Secondary | ICD-10-CM | POA: Diagnosis not present

## 2023-07-25 DIAGNOSIS — Z1211 Encounter for screening for malignant neoplasm of colon: Secondary | ICD-10-CM | POA: Insufficient documentation

## 2023-07-25 DIAGNOSIS — M2041 Other hammer toe(s) (acquired), right foot: Secondary | ICD-10-CM

## 2023-07-25 DIAGNOSIS — K219 Gastro-esophageal reflux disease without esophagitis: Secondary | ICD-10-CM | POA: Insufficient documentation

## 2023-07-25 DIAGNOSIS — R197 Diarrhea, unspecified: Secondary | ICD-10-CM | POA: Insufficient documentation

## 2023-07-25 DIAGNOSIS — Z860100 Personal history of colon polyps, unspecified: Secondary | ICD-10-CM | POA: Insufficient documentation

## 2023-07-25 DIAGNOSIS — E739 Lactose intolerance, unspecified: Secondary | ICD-10-CM | POA: Insufficient documentation

## 2023-07-25 DIAGNOSIS — R141 Gas pain: Secondary | ICD-10-CM | POA: Insufficient documentation

## 2023-07-25 DIAGNOSIS — R1013 Epigastric pain: Secondary | ICD-10-CM | POA: Insufficient documentation

## 2023-07-25 NOTE — Progress Notes (Signed)
 Chief Complaint  Patient presents with   Toe Pain    2nd toe right - hammertoe deformity x years, starting to get some rubbing from shoe gear, redness at the knuckle, sometimes lateral foot bilateral (5th met base) is tender when walking as well, would like recommendations to help rather than surgery   New Patient (Initial Visit)    Est pt 2020    HPI: 73 y.o. female presenting today as a reestablish new patient for evaluation of hammertoe deformity to the right second digit.  Patient states that when she wears shoes that are narrow or hard and stiff leather she begins to experience pain associated to the toe.  When she wears soft fabric shoes she has no pain  Past Medical History:  Diagnosis Date   Allergic rhinitis    Anxiety    Asthma    Bipolar disorder ([facility])    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   sees Dr.Lovick at [facility]   Celiac disease    Depression    Elevated liver function tests 2019   Elevated testosterone level 2019   Elevated vitamin B12 level 2019   History of colonic polyps    Hypothyroidism    Ileus following gastrointestinal surgery ([facility]) 10/18/2011   Leukopenia    Low TSH level 2019   Malnutrition following gastrointestinal surgery 10/18/2011   Osteoporosis 2018   RBBB (right bundle branch block)    Renal stone 2020    Past Surgical History:  Procedure Laterality Date   CATARACT EXTRACTION Bilateral 11/2019   COLON SURGERY     COLONOSCOPY W/ POLYPECTOMY     COLOSTOMY TAKEDOWN  05/29/2012   Procedure: LAPAROSCOPIC COLOSTOMY TAKEDOWN;  Surgeon: Adolph Pollack, MD;  Location: [facility];  Service: General;  Laterality: N/A;  Laparoscopic Assisted Colostomy Closure  with proctoscopy   CYSTECTOMY     LEFT FOOT, RIGHT HAND   DILATATION & CURETTAGE/HYSTEROSCOPY WITH MYOSURE N/A 07/25/2018   Procedure: DILATATION & CURETTAGE/HYSTEROSCOPY WITH MYOSURE RESECTION OF ENDOMETRIAL AND ENDOCERVICAL POLYP;  Surgeon: Patton Salles, MD;  Location: [facility];  Service: Gynecology;  Laterality: N/A;   FOOT SURGERY     FOOT SURGERY Left 12/07/2018   cyst removed   LAPAROTOMY  10/07/2011   Procedure: EXPLORATORY LAPAROTOMY;  Surgeon: Adolph Pollack, MD;  Location: [facility];  Service: General;  Laterality: N/A;  hartmann procedure and creation of colostomy   NASAL SEPTOPLASTY W/ TURBINOPLASTY Bilateral 11/11/2015   Procedure: SEPTOPLASTY BILATERAL TURBINATE  REDUCTION ;  Surgeon: Newman Pies, MD;  Location: [facility];  Service: ENT;  Laterality: Bilateral;   NASAL SINUS SURGERY Bilateral 11/11/2015   Procedure: BILATERAL ENDOSCOPIC  MAXILLARY ANTROSTOMY;  Surgeon: Newman Pies, MD;  Location: [facility];  Service: ENT;  Laterality: Bilateral;   SKIN BIOPSY Right 06/04/2019   basal cell carcinoma , nodular pattern   TONSILLECTOMY AND ADENOIDECTOMY     TUBAL LIGATION     UPPER GASTROINTESTINAL ENDOSCOPY  01/19/12    Allergies  Allergen Reactions   Gluten Meal Other (See Comments)   Flavoring Agent     Other Reaction(s): other   Soy Allergy (Obsolete) Diarrhea   Augmentin [Amoxicillin-Pot Clavulanate] Diarrhea   Chocolate    Other Other (See Comments)    **Celiac Disease*  FOOD ALLERGIES Multiple food intolerances. Eggs, corn, potatoes, yeast, millett, buckwheat, sesame , soy.  Also observes a dairy and wheat free diet.   Sulfa Antibiotics    Sulfonamide Derivatives  Rash    Arms only.   Wound Dressing Adhesive Rash    Some tapes cause rash     Physical Exam: General: The patient is alert and oriented x3 in no acute distress.  Dermatology: Skin is warm, dry and supple bilateral lower extremities.   Vascular: Palpable pedal pulses bilaterally. Capillary refill within normal limits.  No appreciable edema.  No erythema.  Neurological: Grossly intact via light touch  Musculoskeletal Exam: Hammertoe contracture deformity noted most severe to the right second  Radiographic Exam RT foot 07/25/2023:  Normal  osseous mineralization. Joint spaces preserved.  No fractures or osseous irregularities noted.  Hammertoe contracture deformity noted to the lesser digits right foot.  Assessment/Plan of Care: 1.  Hammertoe lesser digits right foot  -Patient evaluated.  X-rays reviewed -Patient is essentially asymptomatic when she wears appropriate shoes that are soft and do not constrict the toebox area. -Continue conservative treatment for now.  Good supportive tennis shoes that do not constrict the toebox area with a soft flexible fabric -Return to clinic as needed       Felecia Shelling, DPM [facility]  Dr. Felecia Shelling, DPM    [address] [place], [place] [address]                Office [phone]  Fax [phone]

## 2023-07-26 ENCOUNTER — Encounter: Payer: Self-pay | Admitting: Internal Medicine

## 2023-07-26 ENCOUNTER — Ambulatory Visit (INDEPENDENT_AMBULATORY_CARE_PROVIDER_SITE_OTHER): Payer: Medicare Other | Admitting: Internal Medicine

## 2023-07-26 VITALS — BP 110/80 | HR 63 | Temp 97.9°F | Ht 63.0 in | Wt 133.6 lb

## 2023-07-26 DIAGNOSIS — C171 Malignant neoplasm of jejunum: Secondary | ICD-10-CM

## 2023-07-26 DIAGNOSIS — E039 Hypothyroidism, unspecified: Secondary | ICD-10-CM | POA: Diagnosis not present

## 2023-07-26 DIAGNOSIS — E559 Vitamin D deficiency, unspecified: Secondary | ICD-10-CM

## 2023-07-26 DIAGNOSIS — E538 Deficiency of other specified B group vitamins: Secondary | ICD-10-CM | POA: Diagnosis not present

## 2023-07-26 DIAGNOSIS — K900 Celiac disease: Secondary | ICD-10-CM

## 2023-07-26 NOTE — Progress Notes (Signed)
 New Patient Office Visit     CC/Reason for Visit: Establish care, discuss chronic medical conditions Previous PCP: Bernardo Heater, NP Last Visit: December 2024  HPI: Mary Leach is a 73 y.o. female who is coming in today for the above mentioned reasons. Past Medical History is significant for: Celiac disease, hypothyroidism, history of vitamin D and B12 deficiency.  She was diagnosed with jejunal adenocarcinoma in 2023 is status post surgery and chemotherapy.  She is not currently under active surveillance and is followed at [facility].  She has allergies to sulfa drugs.  She is feeling well today and has no acute concerns or complaints.   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   sees Dr.Lovick at [facility]   Celiac disease    Depression    Elevated liver function tests 2019   Elevated testosterone level 2019   Elevated vitamin B12 level 2019   History of colonic polyps    Hypothyroidism    Ileus following gastrointestinal surgery ([facility]) 10/18/2011   Leukopenia    Low TSH level 2019   Malnutrition following gastrointestinal surgery 10/18/2011   Osteoporosis 2018   RBBB (right bundle branch block)    Renal stone 2020    Past Surgical History:  Procedure Laterality Date   CATARACT EXTRACTION Bilateral 11/2019   COLON SURGERY     COLONOSCOPY W/ POLYPECTOMY     COLOSTOMY TAKEDOWN  05/29/2012   Procedure: LAPAROSCOPIC COLOSTOMY TAKEDOWN;  Surgeon: Adolph Pollack, MD;  Location: [facility];  Service: General;  Laterality: N/A;  Laparoscopic Assisted Colostomy Closure  with proctoscopy   CYSTECTOMY     LEFT FOOT, RIGHT HAND   DILATATION & CURETTAGE/HYSTEROSCOPY WITH MYOSURE N/A 07/25/2018   Procedure: DILATATION & CURETTAGE/HYSTEROSCOPY WITH MYOSURE RESECTION OF ENDOMETRIAL AND ENDOCERVICAL POLYP;  Surgeon: Patton Salles, MD;  Location:  [facility];  Service: Gynecology;  Laterality: N/A;   FOOT SURGERY     FOOT SURGERY Left 12/07/2018   cyst removed   LAPAROTOMY  10/07/2011   Procedure: EXPLORATORY LAPAROTOMY;  Surgeon: Adolph Pollack, MD;  Location[facility];  Service: General;  Laterality: N/A;  hartmann procedure and creation of colostomy   NASAL SEPTOPLASTY W/ TURBINOPLASTY Bilateral 11/11/2015   Procedure: SEPTOPLASTY BILATERAL TURBINATE  REDUCTION ;  Surgeon: Newman Pies, MD;  Location: [facility];  Service: ENT;  Laterality: Bilateral;   NASAL SINUS SURGERY Bilateral 11/11/2015   Procedure: BILATERAL ENDOSCOPIC  MAXILLARY ANTROSTOMY;  Surgeon: Newman Pies, MD;  Location: [facility];  Service: ENT;  Laterality: Bilateral;   SKIN BIOPSY Right 06/04/2019   basal cell carcinoma , nodular pattern   TONSILLECTOMY AND ADENOIDECTOMY     TUBAL LIGATION     UPPER GASTROINTESTINAL ENDOSCOPY  01/19/12    Social History:  reports that she has never smoked. She has never used smokeless tobacco. She reports current alcohol use of about 4.0 - 5.0 standard drinks of alcohol per week. She reports that she does not use drugs.  Allergies: Allergies  Allergen Reactions   Gluten Meal Other (See Comments)   Flavoring Agent     Other Reaction(s): other   Soy Allergy (Obsolete) Diarrhea   Augmentin [Amoxicillin-Pot Clavulanate] Diarrhea   Chocolate    Other Other (See Comments)    **Celiac Disease*  FOOD ALLERGIES  Multiple food intolerances. Eggs, corn, potatoes, yeast, millett, buckwheat, sesame , soy.  Also observes a dairy and wheat free diet.   Sulfa Antibiotics    Sulfonamide Derivatives Rash    Arms only.   Wound Dressing Adhesive Rash    Some tapes cause rash    Family History:  Family History  Problem Relation Age of Onset   Heart disease Father    Osteoporosis Father    Osteoporosis Mother    Mental illness Paternal Aunt    Skin cancer Maternal Grandmother    Hypertension  Maternal Grandfather    Heart disease Paternal Grandfather    Celiac disease Grandchild      Current Outpatient Medications:    ARMOUR THYROID 120 MG tablet, Take 1 tablet by mouth daily., Disp: , Rfl:    cetirizine (ZYRTEC) 10 MG tablet, Take 10 mg by mouth every evening. , Disp: , Rfl:    FLUoxetine (PROZAC) 10 MG capsule, Take 3 capsules (30 mg total) by mouth daily. Takes 3 tablets in AM, Disp: 270 capsule, Rfl: 3   folic acid (FOLVITE) 1 MG tablet, Take 1 mg by mouth daily., Disp: , Rfl:    pantoprazole (PROTONIX) 40 MG tablet, Take 40 mg by mouth every morning., Disp: , Rfl:    Probiotic Product (PROBIOTIC DAILY PO), Take by mouth. metaflora IB, Disp: , Rfl:  No current facility-administered medications for this visit.  Facility-Administered Medications Ordered in Other Visits:    0.9 %  sodium chloride infusion, , Intravenous, Once PRN, Si Gaul, MD   albuterol (VENTOLIN HFA) 108 (90 Base) MCG/ACT inhaler 2 puff, 2 puff, Inhalation, Once PRN, Si Gaul, MD   diphenhydrAMINE (BENADRYL) injection 50 mg, 50 mg, Intravenous, Once PRN, Si Gaul, MD   EPINEPHrine (EPI-PEN) injection 0.3 mg, 0.3 mg, Intramuscular, Once PRN, Si Gaul, MD   famotidine (PEPCID) IVPB 20 mg premix, 20 mg, Intravenous, Once PRN, Si Gaul, MD   methylPREDNISolone sodium succinate (SOLU-MEDROL) 125 mg/2 mL injection 125 mg, 125 mg, Intravenous, Once PRN, Si Gaul, MD  Review of Systems:  Negative except as indicated in HPI.   Physical Exam: Vitals:   07/26/23 1324  BP: 110/80  Pulse: 63  Temp: 97.9 F (36.6 C)  TempSrc: Oral  SpO2: 99%  Weight: 133 lb 9.6 oz (60.6 kg)  Height: 5\' 3"  (1.6 m)   Body mass index is 23.67 kg/m.  Physical Exam Vitals reviewed.  Constitutional:      Appearance: Normal appearance.  HENT:     Head: Normocephalic and atraumatic.  Eyes:     Conjunctiva/sclera: Conjunctivae normal.  Skin:    General: Skin is warm and dry.   Neurological:     General: No focal deficit present.     Mental Status: She is alert and oriented to person, place, and time.  Psychiatric:        Mood and Affect: Mood normal.        Behavior: Behavior normal.        Thought Content: Thought content normal.        Judgment: Judgment normal.       Impression and Plan:  Acquired hypothyroidism  Vitamin B12 deficiency (non anemic)  Vitamin D deficiency  Jejunal adenocarcinoma (HCC)  Celiac disease   -She has not had recent vitamin D and B12 labs.  Has upcoming appointment with [facility] and we will see if they can add if not she will call her office to schedule lab visit. -Schedule follow-up when due  for annual wellness visit.  Time spent: 46 minutes reviewing chart, patient and formulating plan of care.        Chaya Jan, MD [facility] at [facility]

## 2023-07-28 DIAGNOSIS — Z85068 Personal history of other malignant neoplasm of small intestine: Secondary | ICD-10-CM | POA: Diagnosis not present

## 2023-08-08 DIAGNOSIS — Z9221 Personal history of antineoplastic chemotherapy: Secondary | ICD-10-CM | POA: Diagnosis not present

## 2023-08-08 DIAGNOSIS — R911 Solitary pulmonary nodule: Secondary | ICD-10-CM | POA: Diagnosis not present

## 2023-08-08 DIAGNOSIS — Z08 Encounter for follow-up examination after completed treatment for malignant neoplasm: Secondary | ICD-10-CM | POA: Diagnosis not present

## 2023-08-08 DIAGNOSIS — D72819 Decreased white blood cell count, unspecified: Secondary | ICD-10-CM | POA: Diagnosis not present

## 2023-08-08 DIAGNOSIS — D709 Neutropenia, unspecified: Secondary | ICD-10-CM | POA: Diagnosis not present

## 2023-08-08 DIAGNOSIS — C171 Malignant neoplasm of jejunum: Secondary | ICD-10-CM | POA: Diagnosis not present

## 2023-08-08 DIAGNOSIS — Z85068 Personal history of other malignant neoplasm of small intestine: Secondary | ICD-10-CM | POA: Diagnosis not present

## 2023-08-08 DIAGNOSIS — Z9049 Acquired absence of other specified parts of digestive tract: Secondary | ICD-10-CM | POA: Diagnosis not present

## 2023-08-11 ENCOUNTER — Telehealth: Payer: Self-pay | Admitting: Internal Medicine

## 2023-08-11 DIAGNOSIS — E559 Vitamin D deficiency, unspecified: Secondary | ICD-10-CM

## 2023-08-11 DIAGNOSIS — E538 Deficiency of other specified B group vitamins: Secondary | ICD-10-CM

## 2023-08-11 NOTE — Telephone Encounter (Signed)
Patient is aware and labs ordered. 

## 2023-08-11 NOTE — Telephone Encounter (Signed)
 Spoke with patient to schedule her AWV  12/30/23.    Patient stated she would like to have labs for vit d and b.

## 2023-08-11 NOTE — Telephone Encounter (Signed)
 Patient had an appointment 07/26/22.  Okay to order the labs as requested?

## 2023-08-15 ENCOUNTER — Other Ambulatory Visit (INDEPENDENT_AMBULATORY_CARE_PROVIDER_SITE_OTHER)

## 2023-08-15 DIAGNOSIS — E538 Deficiency of other specified B group vitamins: Secondary | ICD-10-CM | POA: Diagnosis not present

## 2023-08-15 DIAGNOSIS — E559 Vitamin D deficiency, unspecified: Secondary | ICD-10-CM | POA: Diagnosis not present

## 2023-08-15 DIAGNOSIS — Z719 Counseling, unspecified: Secondary | ICD-10-CM

## 2023-08-15 LAB — VITAMIN D 25 HYDROXY (VIT D DEFICIENCY, FRACTURES): VITD: 31.33 ng/mL (ref 30.00–100.00)

## 2023-08-15 LAB — VITAMIN B12: Vitamin B-12: 385 pg/mL (ref 211–911)

## 2023-08-16 ENCOUNTER — Encounter: Payer: Medicare Other | Admitting: Obstetrics and Gynecology

## 2023-08-16 ENCOUNTER — Encounter: Payer: Self-pay | Admitting: Internal Medicine

## 2023-08-22 DIAGNOSIS — Z719 Counseling, unspecified: Secondary | ICD-10-CM

## 2023-08-29 DIAGNOSIS — H401221 Low-tension glaucoma, left eye, mild stage: Secondary | ICD-10-CM | POA: Diagnosis not present

## 2023-08-29 DIAGNOSIS — H04123 Dry eye syndrome of bilateral lacrimal glands: Secondary | ICD-10-CM | POA: Diagnosis not present

## 2023-08-29 DIAGNOSIS — H401212 Low-tension glaucoma, right eye, moderate stage: Secondary | ICD-10-CM | POA: Diagnosis not present

## 2023-08-29 DIAGNOSIS — H43813 Vitreous degeneration, bilateral: Secondary | ICD-10-CM | POA: Diagnosis not present

## 2023-09-05 ENCOUNTER — Encounter (INDEPENDENT_AMBULATORY_CARE_PROVIDER_SITE_OTHER): Payer: Self-pay

## 2023-09-05 DIAGNOSIS — Z719 Counseling, unspecified: Secondary | ICD-10-CM

## 2023-09-08 NOTE — Progress Notes (Signed)
 73 y.o. G2P2 Married Caucasian female here for a breast and pelvic exam.    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  Has completed treatment and is feeling good.   Will be going to [place].   PCP: Zilphia Hilt, Charyl Coppersmith, MD   Mary Leach's last menstrual period was 05/24/2006.           Sexually active: No.  The current method of family planning is post menopausal status.    Menopausal hormone therapy: none Exercising: Yes.     Aerobics and weight lifting  Smoker: no  OB History     Gravida  2   Para  2   Term      Preterm      AB      Living  2      SAB      IAB      Ectopic      Multiple      Live Births              HEALTH MAINTENANCE: Last 2 paps: 2019-WNL, 01/16/2020-WNL History of abnormal Pap or positive HPV:  no Mammogram: 06/17/2023 - BI-RADS1 - [facility] Colonoscopy:   Bone Density: 04/03/2019  Result  osteoporosis of wrist, osteopenia of left hip. No treatment.   Immunization History  Administered Date(s) Administered   DTaP 11/12/1986, 01/08/2000   Fluad Quad(high Dose 65+) 04/21/2022   Influenza Inj Mdck Quad Pf 02/07/2019   Influenza Split 04/20/2011, 03/18/2012, 03/29/2013   Influenza Whole 03/07/2018   Influenza, Quadrivalent, Recombinant, Inj, Pf 02/23/2016, 02/28/2017   Influenza,inj,Quad PF,6+ Mos 02/25/2014   Influenza-Unspecified 02/06/2019, 02/07/2019, 03/18/2019, 05/08/2020, 02/22/2023   Moderna Sars-Covid-2 Vaccination 07/26/2019, 08/31/2019   PPD Test 06/06/2013   Pneumococcal Conjugate-13 04/03/2018   Pneumococcal Polysaccharide-23 05/30/2012, 03/15/2017   Tdap 06/26/2009   Zoster Recombinant(Shingrix) 08/12/2022, 01/18/2023   Zoster, Live 02/25/2014      reports that she has never smoked. She has never used smokeless tobacco. She reports current alcohol use of about 4.0 - 5.0 standard drinks of alcohol per week. She reports that she does not use drugs.  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   sees Dr.Lovick at [facility]   Celiac disease    Depression    Elevated liver function tests 2019   Elevated testosterone  level 2019   Elevated vitamin B12 level 2019   History of colonic polyps    Hypothyroidism    Ileus following gastrointestinal surgery (HCC) 10/18/2011   Leukopenia    Low TSH level 2019   Malnutrition following gastrointestinal surgery 10/18/2011   Osteoporosis 2018   RBBB (right bundle branch block)    Renal stone 2020    Past Surgical History:  Procedure Laterality Date   CATARACT EXTRACTION Bilateral 11/2019   COLON SURGERY     COLONOSCOPY W/ POLYPECTOMY     COLOSTOMY TAKEDOWN  05/29/2012   Procedure: LAPAROSCOPIC COLOSTOMY TAKEDOWN;  Surgeon: Harlee Lichtenstein, MD;  Location: [facility];  Service: General;  Laterality: N/A;  Laparoscopic Assisted Colostomy Closure  with proctoscopy   CYSTECTOMY     LEFT FOOT, RIGHT HAND   DILATATION & CURETTAGE/HYSTEROSCOPY WITH MYOSURE N/A 07/25/2018   Procedure: DILATATION & CURETTAGE/HYSTEROSCOPY WITH MYOSURE RESECTION OF ENDOMETRIAL AND ENDOCERVICAL POLYP;  Surgeon: Greta Leatherwood, MD;  Location: [facility];  Service: Gynecology;  Laterality: N/A;   FOOT SURGERY     FOOT SURGERY Left 12/07/2018   cyst removed   LAPAROTOMY  10/07/2011   Procedure: EXPLORATORY LAPAROTOMY;  Surgeon: Harlee Lichtenstein, MD;  Location: [facility];  Service: General;  Laterality: N/A;  hartmann procedure and creation of colostomy   NASAL SEPTOPLASTY W/ TURBINOPLASTY Bilateral 11/11/2015   Procedure: SEPTOPLASTY BILATERAL TURBINATE  REDUCTION ;  Surgeon: Reynold Caves, MD;  Location: [facility];  Service: ENT;  Laterality: Bilateral;   NASAL SINUS SURGERY Bilateral 11/11/2015   Procedure: BILATERAL ENDOSCOPIC  MAXILLARY ANTROSTOMY;  Surgeon: Reynold Caves, MD;  Location: [facility];  Service: ENT;  Laterality: Bilateral;    SKIN BIOPSY Right 06/04/2019   basal cell carcinoma , nodular pattern   TONSILLECTOMY AND ADENOIDECTOMY     TUBAL LIGATION     UPPER GASTROINTESTINAL ENDOSCOPY  01/19/12    Current Outpatient Medications  Medication Sig Dispense Refill   ARMOUR THYROID 120 MG tablet Take 1 tablet by mouth daily.     cetirizine (ZYRTEC) 10 MG tablet Take 10 mg by mouth every evening.      FLUoxetine  (PROZAC ) 10 MG capsule Take 3 capsules (30 mg total) by mouth daily. Takes 3 tablets in AM 270 capsule 3   folic acid  (FOLVITE ) 1 MG tablet Take 1 mg by mouth daily.     Multiple Vitamin (MULTIVITAMIN PO) Take by mouth.     pantoprazole  (PROTONIX ) 40 MG tablet Take 40 mg by mouth every morning.     Probiotic Product (PROBIOTIC DAILY PO) Take by mouth. metaflora IB     No current facility-administered medications for this visit.   Facility-Administered Medications Ordered in Other Visits  Medication Dose Route Frequency Provider Last Rate Last Admin   0.9 %  sodium chloride  infusion   Intravenous Once PRN Marlene Simas, MD       albuterol  (VENTOLIN  HFA) 108 (90 Base) MCG/ACT inhaler 2 puff  2 puff Inhalation Once PRN Marlene Simas, MD       diphenhydrAMINE  (BENADRYL ) injection 50 mg  50 mg Intravenous Once PRN Marlene Simas, MD       EPINEPHrine  (EPI-PEN) injection 0.3 mg  0.3 mg Intramuscular Once PRN Marlene Simas, MD       famotidine  (PEPCID ) IVPB 20 mg premix  20 mg Intravenous Once PRN Marlene Simas, MD       methylPREDNISolone  sodium succinate (SOLU-MEDROL ) 125 mg/2 mL injection 125 mg  125 mg Intravenous Once PRN Marlene Simas, MD        ALLERGIES: Gluten meal, Flavoring agent, Soy allergy (obsolete), Augmentin  [amoxicillin -pot clavulanate], Chocolate, Other, Sulfa antibiotics, Sulfonamide derivatives, and Wound dressing adhesive  Family History  Problem Relation Age of Onset   Heart disease Father    Osteoporosis Father    Osteoporosis Mother    Mental illness Paternal Aunt     Skin cancer Maternal Grandmother    Hypertension Maternal Grandfather    Heart disease Paternal Grandfather    Celiac disease Grandchild     Review of Systems  All other systems reviewed and are negative.   PHYSICAL EXAM:  BP 120/78 (BP Location: Left Arm, Mary Leach Position: Sitting, Cuff Size: Normal)   Pulse 62   Ht 5' 4.25" (1.632 m)   Wt 131 lb (59.4 kg)   LMP 05/24/2006   SpO2 98%   BMI 22.31 kg/m     General appearance: alert, cooperative and appears stated age Head: normocephalic, without obvious abnormality, atraumatic Neck: no adenopathy,  supple, symmetrical, trachea midline and thyroid normal to inspection and palpation Lungs: clear to auscultation bilaterally Breasts: normal appearance, no masses or tenderness, No nipple retraction or dimpling, No nipple discharge or bleeding, No axillary adenopathy Heart: regular rate and rhythm Abdomen: soft, non-tender; no masses, no organomegaly Extremities: extremities normal, atraumatic, no cyanosis or edema Skin: skin color, texture, turgor normal. No rashes or lesions Lymph nodes: cervical, supraclavicular, and axillary nodes normal. Neurologic: grossly normal  Pelvic: External genitalia:  no lesions              No abnormal inguinal nodes palpated.              Urethra:  normal appearing urethra with no masses, tenderness or lesions              Bartholins and Skenes: normal                 Vagina: normal appearing vagina with normal color and discharge, no lesions              Cervix: no lesions              Pap taken: Yes.   Bimanual Exam:  Uterus:  normal size, contour, position, consistency, mobility, non-tender              Adnexa: no mass, fullness, tenderness              Rectal exam: Yes.  .  Confirms.              Anus:  normal sphincter tone, no lesions  Chaperone was present for exam:   Cottie Diss, CMA  ASSESSMENT: Encounter for breast and pelvic exam.  Cervical cancer screening.  Adenocarcinoma of the  jejunum.  Status post surgical excision, chemotherapy. Osteoporosis of wrist.  No current pathologic fracture.   PLAN: Mammogram screening discussed. Self breast awareness reviewed. Pap and reflex HRV collected:  yes Guidelines for Calcium, Vitamin D , regular exercise program including cardiovascular and weight bearing exercise. Medication refills:  [facility]  May need office follow up visit after BMD is complete. Follow up:  2 years and prn.    Additional counseling given.  Yes. 20 min  total time was spent for this Mary Leach encounter, including preparation, face-to-face counseling with the Mary Leach, coordination of care, and documentation of the encounter in addition to doing the breast and pelvic exam.

## 2023-09-13 ENCOUNTER — Ambulatory Visit (INDEPENDENT_AMBULATORY_CARE_PROVIDER_SITE_OTHER): Admitting: Obstetrics and Gynecology

## 2023-09-13 ENCOUNTER — Other Ambulatory Visit (HOSPITAL_COMMUNITY)
Admission: RE | Admit: 2023-09-13 | Discharge: 2023-09-13 | Disposition: A | Discharge status: Home / Self Care | Source: Ambulatory Visit | Attending: Obstetrics and Gynecology | Admitting: Obstetrics and Gynecology

## 2023-09-13 ENCOUNTER — Encounter: Payer: Self-pay | Admitting: Obstetrics and Gynecology

## 2023-09-13 VITALS — BP 120/78 | HR 62 | Ht 64.25 in | Wt 131.0 lb

## 2023-09-13 DIAGNOSIS — M810 Age-related osteoporosis without current pathological fracture: Secondary | ICD-10-CM | POA: Diagnosis not present

## 2023-09-13 DIAGNOSIS — Z124 Encounter for screening for malignant neoplasm of cervix: Secondary | ICD-10-CM

## 2023-09-13 DIAGNOSIS — Z01419 Encounter for gynecological examination (general) (routine) without abnormal findings: Secondary | ICD-10-CM

## 2023-09-13 NOTE — Patient Instructions (Signed)

## 2023-09-15 LAB — CYTOLOGY - PAP: Diagnosis: NEGATIVE

## 2023-09-19 ENCOUNTER — Encounter (INDEPENDENT_AMBULATORY_CARE_PROVIDER_SITE_OTHER): Payer: Self-pay

## 2023-09-19 DIAGNOSIS — Z719 Counseling, unspecified: Secondary | ICD-10-CM

## 2023-09-20 ENCOUNTER — Encounter: Payer: Self-pay | Admitting: Obstetrics and Gynecology

## 2023-09-26 ENCOUNTER — Encounter

## 2023-10-10 DIAGNOSIS — Z719 Counseling, unspecified: Secondary | ICD-10-CM

## 2023-11-14 DIAGNOSIS — Z719 Counseling, unspecified: Secondary | ICD-10-CM

## 2023-11-23 ENCOUNTER — Other Ambulatory Visit: Payer: Self-pay | Admitting: Internal Medicine

## 2023-11-23 DIAGNOSIS — Z1231 Encounter for screening mammogram for malignant neoplasm of breast: Secondary | ICD-10-CM

## 2023-12-05 ENCOUNTER — Other Ambulatory Visit: Payer: Self-pay | Admitting: Internal Medicine

## 2023-12-05 DIAGNOSIS — F341 Dysthymic disorder: Secondary | ICD-10-CM

## 2023-12-07 ENCOUNTER — Ambulatory Visit: Admitting: Podiatry

## 2023-12-10 ENCOUNTER — Encounter: Payer: Self-pay | Admitting: Internal Medicine

## 2023-12-13 ENCOUNTER — Ambulatory Visit (INDEPENDENT_AMBULATORY_CARE_PROVIDER_SITE_OTHER): Admitting: Internal Medicine

## 2023-12-13 ENCOUNTER — Encounter: Payer: Self-pay | Admitting: Internal Medicine

## 2023-12-13 VITALS — BP 130/80 | HR 70 | Temp 98.2°F | Wt 134.1 lb

## 2023-12-13 DIAGNOSIS — L309 Dermatitis, unspecified: Secondary | ICD-10-CM

## 2023-12-13 NOTE — Progress Notes (Signed)
 Established Patient Office Visit     CC/Reason for Visit: Rash behind right ear  HPI: Mary Leach is a 73 y.o. female who is coming in today for the above mentioned reasons.  Present for about a week.  Itchy.   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   sees Dr.Lovick at [facility]   Celiac disease    Depression    Elevated liver function tests 2019   Elevated testosterone  level 2019   Elevated vitamin B12 level 2019   History of colonic polyps    Hypothyroidism    Ileus following gastrointestinal surgery (HCC) 10/18/2011   Leukopenia    Low TSH level 2019   Malnutrition following gastrointestinal surgery 10/18/2011   Osteoporosis 2018   RBBB (right bundle branch block)    Renal stone 2020    Past Surgical History:  Procedure Laterality Date   CATARACT EXTRACTION Bilateral 11/2019   COLON SURGERY     COLONOSCOPY W/ POLYPECTOMY     COLOSTOMY TAKEDOWN  05/29/2012   Procedure: LAPAROSCOPIC COLOSTOMY TAKEDOWN;  Surgeon: Krystal JINNY Russell, MD;  Location: [facility];  Service: General;  Laterality: N/A;  Laparoscopic Assisted Colostomy Closure  with proctoscopy   CYSTECTOMY     LEFT FOOT, RIGHT HAND   DILATATION & CURETTAGE/HYSTEROSCOPY WITH MYOSURE N/A 07/25/2018   Procedure: DILATATION & CURETTAGE/HYSTEROSCOPY WITH MYOSURE RESECTION OF ENDOMETRIAL AND ENDOCERVICAL POLYP;  Surgeon: Cathlyn JAYSON Nikki Bobie FORBES, MD;  Location: [facility];  Service: Gynecology;  Laterality: N/A;   FOOT SURGERY     FOOT SURGERY Left 12/07/2018   cyst removed   LAPAROTOMY  10/07/2011   Procedure: EXPLORATORY LAPAROTOMY;  Surgeon: Krystal JINNY Russell, MD;  Location: [facility];  Service: General;  Laterality: N/A;  hartmann procedure and creation of colostomy   NASAL SEPTOPLASTY W/ TURBINOPLASTY Bilateral 11/11/2015   Procedure: SEPTOPLASTY BILATERAL  TURBINATE  REDUCTION ;  Surgeon: Daniel Moccasin, MD;  Location: [facility];  Service: ENT;  Laterality: Bilateral;   NASAL SINUS SURGERY Bilateral 11/11/2015   Procedure: BILATERAL ENDOSCOPIC  MAXILLARY ANTROSTOMY;  Surgeon: Daniel Moccasin, MD;  Location: [facility];  Service: ENT;  Laterality: Bilateral;   SKIN BIOPSY Right 06/04/2019   basal cell carcinoma , nodular pattern   TONSILLECTOMY AND ADENOIDECTOMY     TUBAL LIGATION     UPPER GASTROINTESTINAL ENDOSCOPY  01/19/12    Social History:  reports that she has never smoked. She has never used smokeless tobacco. She reports current alcohol use of about 4.0 - 5.0 standard drinks of alcohol per week. She reports that she does not use drugs.  Allergies: Allergies  Allergen Reactions   Gluten Meal Other (See Comments)   Flavoring Agent     Other Reaction(s): other   Soy Allergy (Obsolete) Diarrhea   Augmentin  [Amoxicillin -Pot Clavulanate] Diarrhea   Chocolate    Other Other (See Comments)    **Celiac Disease*  FOOD ALLERGIES Multiple food intolerances. Eggs, corn, potatoes, yeast, millett, buckwheat, sesame , soy.  Also observes a dairy and wheat free diet.   Sulfa Antibiotics    Sulfonamide Derivatives Rash    Arms only.   Wound Dressing Adhesive Rash    Some tapes cause rash    Family History:  Family History  Problem Relation Age of Onset  Heart disease Father    Osteoporosis Father    Osteoporosis Mother    Mental illness Paternal Aunt    Skin cancer Maternal Grandmother    Hypertension Maternal Grandfather    Heart disease Paternal Grandfather    Celiac disease Grandchild      Current Outpatient Medications:    ARMOUR THYROID  120 MG tablet, Take 1 tablet by mouth daily., Disp: , Rfl:    cetirizine (ZYRTEC) 10 MG tablet, Take 10 mg by mouth every evening. , Disp: , Rfl:    FLUoxetine  (PROZAC ) 10 MG capsule, Take 3 capsules (30 mg total) by mouth once daily, Disp: 90 capsule, Rfl: 3   folic acid   (FOLVITE ) 1 MG tablet, Take 1 mg by mouth daily., Disp: , Rfl:    Multiple Vitamin (MULTIVITAMIN PO), Take by mouth., Disp: , Rfl:    pantoprazole  (PROTONIX ) 40 MG tablet, Take 40 mg by mouth every morning., Disp: , Rfl:    Probiotic Product (PROBIOTIC DAILY PO), Take by mouth. metaflora IB, Disp: , Rfl:  No current facility-administered medications for this visit.  Facility-Administered Medications Ordered in Other Visits:    0.9 %  sodium chloride  infusion, , Intravenous, Once PRN, Sherrod Sherrod, MD   albuterol  (VENTOLIN  HFA) 108 (90 Base) MCG/ACT inhaler 2 puff, 2 puff, Inhalation, Once PRN, Sherrod Sherrod, MD   diphenhydrAMINE  (BENADRYL ) injection 50 mg, 50 mg, Intravenous, Once PRN, Sherrod Sherrod, MD   EPINEPHrine  (EPI-PEN) injection 0.3 mg, 0.3 mg, Intramuscular, Once PRN, Sherrod Sherrod, MD   famotidine  (PEPCID ) IVPB 20 mg premix, 20 mg, Intravenous, Once PRN, Sherrod Sherrod, MD   methylPREDNISolone  sodium succinate (SOLU-MEDROL ) 125 mg/2 mL injection 125 mg, 125 mg, Intravenous, Once PRN, Sherrod Sherrod, MD  Review of Systems:  Negative unless indicated in HPI.   Physical Exam: Vitals:   12/13/23 1257  BP: 130/80  Pulse: 70  Temp: 98.2 F (36.8 C)  TempSrc: Oral  SpO2: 98%  Weight: 134 lb 1.6 oz (60.8 kg)    Body mass index is 22.84 kg/m.   Physical Exam Skin:    Comments: Red, scaly rash behind right ear.      Impression and Plan:  Eczema, unspecified type  -Scaly rash appears to be eczema.  Advised hydrocortisone  cream.  Follow-up as needed   Time spent:21 minutes reviewing chart, interviewing and examining patient and formulating plan of care.     Tully Theophilus Andrews, MD [facility]

## 2023-12-14 DIAGNOSIS — K080 Exfoliation of teeth due to systemic causes: Secondary | ICD-10-CM | POA: Diagnosis not present

## 2023-12-16 DIAGNOSIS — Z719 Counseling, unspecified: Secondary | ICD-10-CM

## 2023-12-21 ENCOUNTER — Ambulatory Visit (INDEPENDENT_AMBULATORY_CARE_PROVIDER_SITE_OTHER)

## 2023-12-21 VITALS — BP 120/60 | HR 61 | Temp 98.1°F | Ht 64.25 in | Wt 134.3 lb

## 2023-12-21 DIAGNOSIS — Z0000 Encounter for general adult medical examination without abnormal findings: Secondary | ICD-10-CM | POA: Diagnosis not present

## 2023-12-21 NOTE — Progress Notes (Signed)
 Subjective:   Mary Leach is a 73 y.o. who presents for a Medicare Wellness preventive visit.  As a reminder, Annual Wellness Visits don't include a physical exam, and some assessments may be limited, especially if this visit is performed virtually. We may recommend an in-person follow-up visit with your provider if needed.  Visit Complete: In person    Persons Participating in Visit: Patient.  AWV Questionnaire: No: Patient Medicare AWV questionnaire was not completed prior to this visit.  Cardiac Risk Factors include: advanced age (>22men, >74 women)     Objective:    Today's Vitals   12/21/23 1428  BP: 120/60  Pulse: 61  Temp: 98.1 F (36.7 C)  TempSrc: Oral  SpO2: 98%  Weight: 134 lb 4.8 oz (60.9 kg)  Height: 5' 4.25 (1.632 m)   Body mass index is 22.87 kg/m.     12/21/2023    2:45 PM 09/03/2021   10:01 AM 07/09/2021   10:15 AM 06/09/2021    2:07 PM 04/22/2021   11:16 AM 03/25/2021   10:34 AM 12/16/2020    9:12 AM  Advanced Directives  Does Patient Have a Medical Advance Directive? Yes Yes Yes Yes Yes Yes No  Type of Estate agent of [place];Living will Healthcare Power of [place];Living will Healthcare Power of [place];Living will Healthcare Power of [place];Living will Healthcare Power of [place];Living will Healthcare Power of [place];Living will   Does patient want to make changes to medical advance directive?   No - Patient declined No - Patient declined     Copy of Healthcare Power of Attorney in Chart? No - copy requested No - copy requested No - copy requested No - copy requested     Would patient like information on creating a medical advance directive?       No - Patient declined    Current Medications (verified) Outpatient Encounter Medications as of 12/21/2023  Medication Sig   ARMOUR THYROID  120 MG tablet Take 1 tablet by mouth daily.   cetirizine (ZYRTEC) 10 MG tablet Take 10 mg by mouth every evening.     FLUoxetine  (PROZAC ) 10 MG capsule Take 3 capsules (30 mg total) by mouth once daily   folic acid  (FOLVITE ) 1 MG tablet Take 1 mg by mouth daily.   Multiple Vitamin (MULTIVITAMIN PO) Take by mouth.   pantoprazole  (PROTONIX ) 40 MG tablet Take 40 mg by mouth every morning.   Probiotic Product (PROBIOTIC DAILY PO) Take by mouth. metaflora IB   Facility-Administered Encounter Medications as of 12/21/2023  Medication   0.9 %  sodium chloride  infusion   albuterol  (VENTOLIN  HFA) 108 (90 Base) MCG/ACT inhaler 2 puff   diphenhydrAMINE  (BENADRYL ) injection 50 mg   EPINEPHrine  (EPI-PEN) injection 0.3 mg   famotidine  (PEPCID ) IVPB 20 mg premix   methylPREDNISolone  sodium succinate (SOLU-MEDROL ) 125 mg/2 mL injection 125 mg    Allergies (verified) Gluten meal, Flavoring agent, Soy allergy (obsolete), Augmentin  [amoxicillin -pot clavulanate], Chocolate, Other, Sulfa antibiotics, Sulfonamide derivatives, and Wound dressing adhesive   History: Past Medical History:  Diagnosis Date   Allergic rhinitis    Anxiety    Asthma    Bipolar disorder (HCC)    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   sees Dr.Lovick at [facility]   Celiac disease    Depression    Elevated liver function tests 2019   Elevated testosterone  level 2019   Elevated vitamin B12 level 2019   History of colonic polyps    Hypothyroidism    Ileus following gastrointestinal  surgery (HCC) 10/18/2011   Leukopenia    Low TSH level 2019   Malnutrition following gastrointestinal surgery 10/18/2011   Osteoporosis 2018   RBBB (right bundle branch block)    Renal stone 2020   Past Surgical History:  Procedure Laterality Date   CATARACT EXTRACTION Bilateral 11/2019   COLON SURGERY     COLONOSCOPY W/ POLYPECTOMY     COLOSTOMY TAKEDOWN  05/29/2012   Procedure: LAPAROSCOPIC COLOSTOMY TAKEDOWN;  Surgeon: Krystal JINNY Russell, MD;  Location: [facility];  Service: General;  Laterality: N/A;  Laparoscopic Assisted Colostomy Closure  with proctoscopy   CYSTECTOMY      LEFT FOOT, RIGHT HAND   DILATATION & CURETTAGE/HYSTEROSCOPY WITH MYOSURE N/A 07/25/2018   Procedure: DILATATION & CURETTAGE/HYSTEROSCOPY WITH MYOSURE RESECTION OF ENDOMETRIAL AND ENDOCERVICAL POLYP;  Surgeon: Cathlyn JAYSON Nikki Bobie FORBES, MD;  Location: [facility];  Service: Gynecology;  Laterality: N/A;   FOOT SURGERY     FOOT SURGERY Left 12/07/2018   cyst removed   LAPAROTOMY  10/07/2011   Procedure: EXPLORATORY LAPAROTOMY;  Surgeon: Krystal JINNY Russell, MD;  Location: [facility];  Service: General;  Laterality: N/A;  hartmann procedure and creation of colostomy   NASAL SEPTOPLASTY W/ TURBINOPLASTY Bilateral 11/11/2015   Procedure: SEPTOPLASTY BILATERAL TURBINATE  REDUCTION ;  Surgeon: Daniel Moccasin, MD;  Location: [facility];  Service: ENT;  Laterality: Bilateral;   NASAL SINUS SURGERY Bilateral 11/11/2015   Procedure: BILATERAL ENDOSCOPIC  MAXILLARY ANTROSTOMY;  Surgeon: Daniel Moccasin, MD;  Location: [facility];  Service: ENT;  Laterality: Bilateral;   SKIN BIOPSY Right 06/04/2019   basal cell carcinoma , nodular pattern   TONSILLECTOMY AND ADENOIDECTOMY     TUBAL LIGATION     UPPER GASTROINTESTINAL ENDOSCOPY  01/19/12   Family History  Problem Relation Age of Onset   Heart disease Father    Osteoporosis Father    Osteoporosis Mother    Mental illness Paternal Aunt    Skin cancer Maternal Grandmother    Hypertension Maternal Grandfather    Heart disease Paternal Grandfather    Celiac disease Grandchild    Social History   Socioeconomic History   Marital status: Married    Spouse name: Not on file   Number of children: Not on file   Years of education: Not on file   Highest education level: Not on file  Occupational History    Employer: [employer]  Tobacco Use   Smoking status: Never   Smokeless tobacco: Never  Vaping Use   Vaping status: Never Used  Substance and Sexual Activity   Alcohol use: Yes    Alcohol/week: 4.0 - 5.0 standard  drinks of alcohol    Types: 4 - 5 Standard drinks or equivalent per week   Drug use: No   Sexual activity: Not Currently    Partners: Male    Birth control/protection: Post-menopausal  Other Topics Concern   Not on file  Social History Narrative   Married, lives with husband and son Norleen.  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   Social Drivers of Corporate investment banker Strain: Low Risk  (12/21/2023)   Overall Financial Resource Strain (CARDIA)    Difficulty of Paying Living Expenses: Not hard at all  Food Insecurity: No Food Insecurity (12/21/2023)   Hunger Vital Sign    Worried About Running Out of Food in the Last Year: Never true    Ran Out of Food in the Last  Year: Never true  Transportation Needs: No Transportation Needs (12/21/2023)   PRAPARE - Administrator, Civil Service (Medical): No    Lack of Transportation (Non-Medical): No  Physical Activity: Sufficiently Active (12/21/2023)   Exercise Vital Sign    Days of Exercise per Week: 3 days    Minutes of Exercise per Session: 50 min  Stress: No Stress Concern Present (12/21/2023)   [employer] of Occupational Health - Occupational Stress Questionnaire    Feeling of Stress: Not at all  Social Connections: Socially Integrated (12/21/2023)   Social Connection and Isolation Panel    Frequency of Communication with Friends and Family: More than three times a week    Frequency of Social Gatherings with Friends and Family: More than three times a week    Attends Religious Services: More than 4 times per year    Active Member of [employer] or Organizations: Yes    Attends Engineer, structural: More than 4 times per year    Marital Status: Married    Tobacco Counseling Counseling given: Not Answered    Clinical Intake:  Pre-visit preparation completed: Yes  Pain : No/denies pain     BMI - recorded: 22.87 Nutritional Status: BMI of 19-24  Normal Nutritional Risks:  None Diabetes: No  Lab Results  Component Value Date   HGBA1C 5.7 (H) 12/26/2014     How often do you need to have someone help you when you read instructions, pamphlets, or other written materials from your doctor or pharmacy?: 1 - Never  Interpreter Needed?: No  Information entered by :: Rojelio Blush LPN   Activities of Daily Living     12/21/2023    2:44 PM  In your present state of health, do you have any difficulty performing the following activities:  Hearing? 0  Vision? 0  Difficulty concentrating or making decisions? 0  Walking or climbing stairs? 0  Dressing or bathing? 0  Doing errands, shopping? 0  Preparing Food and eating ? N  Using the Toilet? N  In the past six months, have you accidently leaked urine? N  Do you have problems with loss of bowel control? N  Managing your Medications? N  Managing your Finances? N  Housekeeping or managing your Housekeeping? N    Patient Care Team: Theophilus Andrews, Tully GRADE, MD as PCP - General (Internal Medicine) Frutoso Luz, MD as Referring Physician (Allergy) Cathlyn JAYSON Nikki Bobie FORBES, MD as Consulting Physician (Obstetrics and Gynecology) [facility]  I have updated your Care Teams any recent Medical Services you may have received from other providers in the past year.     Assessment:   This is a routine wellness examination for Trinitee.  Hearing/Vision screen Hearing Screening - Comments:: Denies hearing difficulties   Vision Screening - Comments:: Wears rx glasses - up to date with routine eye exams with  Dr Octavia   Goals Addressed               This Visit's Progress     Increase physical activity (pt-stated)        Remain active       Depression Screen     12/21/2023    2:31 PM 12/13/2023   12:57 PM 07/26/2023    1:30 PM 02/07/2019    1:24 PM 07/18/2018    1:08 PM 05/09/2018    1:46 PM 06/13/2017    9:26 AM  PHQ 2/9 Scores  PHQ - 2 Score 0 0  0 0 0 0 0  PHQ- 9 Score 0 0 0 0 0 0 2    Fall  Risk     12/21/2023    2:44 PM 12/13/2023   12:57 PM 09/13/2023   11:43 AM 07/26/2023    1:30 PM 07/18/2018    1:08 PM  Fall Risk   Falls in the past year? 0 0 0 0 0   Number falls in past yr: 0 0 0 0 0   Injury with Fall? 0 0 0 0 0  Risk for fall due to : No Fall Risks  No Fall Risks    Follow up Falls evaluation completed Falls evaluation completed Falls evaluation completed Falls evaluation completed      Data saved with a previous flowsheet row definition    MEDICARE RISK AT HOME:  Medicare Risk at Home Any stairs in or around the home?: Yes If so, are there any without handrails?: No Home free of loose throw rugs in walkways, pet beds, electrical cords, etc?: Yes Adequate lighting in your home to reduce risk of falls?: Yes Life alert?: No Use of a cane, walker or w/c?: No Grab bars in the bathroom?: No Shower chair or bench in shower?: No Elevated toilet seat or a handicapped toilet?: No  TIMED UP AND GO:  Was the test performed?  Yes  Length of time to ambulate 10 feet: 10 sec Gait steady and fast without use of assistive device  Cognitive Function: 6CIT completed        12/21/2023    2:45 PM  6CIT Screen  What Year? 0 points  What month? 0 points  What time? 0 points  Count back from 20 0 points  Months in reverse 0 points  Repeat phrase 0 points  Total Score 0 points    Immunizations Immunization History  Administered Date(s) Administered   DTaP 11/12/1986, 01/08/2000   Fluad Quad(high Dose 65+) 04/21/2022   Influenza Inj Mdck Quad Pf 02/07/2019   Influenza Split 04/20/2011, 03/18/2012, 03/29/2013   Influenza Whole 03/07/2018   Influenza, Quadrivalent, Recombinant, Inj, Pf 02/23/2016, 02/28/2017   Influenza,inj,Quad PF,6+ Mos 02/25/2014   Influenza-Unspecified 02/06/2019, 02/07/2019, 03/18/2019, 05/08/2020, 02/22/2023   Moderna Sars-Covid-2 Vaccination 07/26/2019, 08/31/2019   PPD Test 06/06/2013   Pneumococcal Conjugate-13 04/03/2018   Pneumococcal  Polysaccharide-23 05/30/2012, 03/15/2017   Tdap 06/26/2009   Zoster Recombinant(Shingrix) 08/12/2022, 01/18/2023   Zoster, Live 02/25/2014    Screening Tests Health Maintenance  Topic Date Due   DTaP/Tdap/Td (4 - Td or Tdap) 06/27/2019   COVID-19 Vaccine (3 - Moderna risk series) 09/28/2019   INFLUENZA VACCINE  12/23/2023   Medicare Annual Wellness (AWV)  12/20/2024   MAMMOGRAM  06/16/2025   Colonoscopy  12/09/2030   Pneumococcal Vaccine: 50+ Years  Completed   DEXA SCAN  Completed   Hepatitis C Screening  Completed   Zoster Vaccines- Shingrix  Completed   Hepatitis B Vaccines  Aged Out   HPV VACCINES  Aged Out   Meningococcal B Vaccine  Aged Out    Health Maintenance  Health Maintenance Due  Topic Date Due   DTaP/Tdap/Td (4 - Td or Tdap) 06/27/2019   COVID-19 Vaccine (3 - Moderna risk series) 09/28/2019   Health Maintenance Items Addressed:   Additional Screening:  Vision Screening: Recommended annual ophthalmology exams for early detection of glaucoma and other disorders of the eye. Would you like a referral to an eye doctor? No    Dental Screening: Recommended annual dental exams for proper  oral hygiene  Community Resource Referral / Chronic Care Management: CRR required this visit?  No   CCM required this visit?  No   Plan:    I have personally reviewed and noted the following in the patient's chart:   Medical and social history Use of alcohol, tobacco or illicit drugs  Current medications and supplements including opioid prescriptions. Patient is not currently taking opioid prescriptions. Functional ability and status Nutritional status Physical activity Advanced directives List of other physicians Hospitalizations, surgeries, and ER visits in previous 12 months Vitals Screenings to include cognitive, depression, and falls Referrals and appointments  In addition, I have reviewed and discussed with patient certain preventive protocols, quality  metrics, and best practice recommendations. A written personalized care plan for preventive services as well as general preventive health recommendations were provided to patient.   Rojelio LELON Blush, LPN   2/69/7974   After Visit Summary: (In Person-Printed) AVS printed and given to the patient  Notes: Nothing significant to report at this time.

## 2023-12-21 NOTE — Patient Instructions (Addendum)
 Mary Leach , Thank you for taking time out of your busy schedule to complete your Annual Wellness Visit with me. I enjoyed our conversation and look forward to speaking with you again next year. I, as well as your care team,  appreciate your ongoing commitment to your health goals. Please review the following plan we discussed and let me know if I can assist you in the future. Your Game plan/ To Do List    Referrals: If you haven't heard from the office you've been referred to, please reach out to them at the phone provided.   Follow up Visits: Next Medicare AWV with our clinical staff: 12/26/24 @ 2:20p   Have you seen your provider in the last 6 months (3 months if uncontrolled diabetes)? 12/13/23 Next Office Visit with your provider:   Clinician Recommendations:  Aim for 30 minutes of exercise or brisk walking, 6-8 glasses of water, and 5 servings of fruits and vegetables each day.       This is a list of the screening recommended for you and due dates:  Health Maintenance  Topic Date Due   DTaP/Tdap/Td vaccine (4 - Td or Tdap) 06/27/2019   COVID-19 Vaccine (3 - Moderna risk series) 09/28/2019   Flu Shot  12/23/2023   Medicare Annual Wellness Visit  12/20/2024   Mammogram  06/16/2025   Colon Cancer Screening  12/09/2030   Pneumococcal Vaccine for age over 26  Completed   DEXA scan (bone density measurement)  Completed   Hepatitis C Screening  Completed   Zoster (Shingles) Vaccine  Completed   Hepatitis B Vaccine  Aged Out   HPV Vaccine  Aged Out   Meningitis B Vaccine  Aged Out    Advanced directives: (Copy Requested) Please bring a copy of your health care power of attorney and living will to the office to be added to your chart at your convenience. You can mail to [facility] [address] [place], [place] [address] or email to [email or web address] Advance Care Planning is important because it:  [x]  Makes sure you receive the medical care that is  consistent with your values, goals, and preferences  [x]  It provides guidance to your family and loved ones and reduces their decisional burden about whether or not they are making the right decisions based on your wishes.  Follow the link provided in your after visit summary or read over the paperwork we have mailed to you to help you started getting your Advance Directives in place. If you need assistance in completing these, please reach out to us  so that we can help you!  See attachments for Preventive Care and Fall Prevention Tips.

## 2023-12-22 DIAGNOSIS — K080 Exfoliation of teeth due to systemic causes: Secondary | ICD-10-CM | POA: Diagnosis not present

## 2023-12-27 DIAGNOSIS — H401221 Low-tension glaucoma, left eye, mild stage: Secondary | ICD-10-CM | POA: Diagnosis not present

## 2023-12-27 DIAGNOSIS — H43813 Vitreous degeneration, bilateral: Secondary | ICD-10-CM | POA: Diagnosis not present

## 2023-12-27 DIAGNOSIS — Z961 Presence of intraocular lens: Secondary | ICD-10-CM | POA: Diagnosis not present

## 2023-12-27 DIAGNOSIS — H401212 Low-tension glaucoma, right eye, moderate stage: Secondary | ICD-10-CM | POA: Diagnosis not present

## 2023-12-30 ENCOUNTER — Ambulatory Visit

## 2024-01-09 ENCOUNTER — Encounter

## 2024-01-16 ENCOUNTER — Encounter

## 2024-01-16 DIAGNOSIS — D72819 Decreased white blood cell count, unspecified: Secondary | ICD-10-CM | POA: Diagnosis not present

## 2024-01-16 DIAGNOSIS — R911 Solitary pulmonary nodule: Secondary | ICD-10-CM | POA: Diagnosis not present

## 2024-01-16 DIAGNOSIS — C171 Malignant neoplasm of jejunum: Secondary | ICD-10-CM | POA: Diagnosis not present

## 2024-01-17 ENCOUNTER — Other Ambulatory Visit: Payer: Self-pay | Admitting: Internal Medicine

## 2024-01-17 DIAGNOSIS — E538 Deficiency of other specified B group vitamins: Secondary | ICD-10-CM

## 2024-01-24 DIAGNOSIS — Z719 Counseling, unspecified: Secondary | ICD-10-CM

## 2024-02-17 DIAGNOSIS — Z719 Counseling, unspecified: Secondary | ICD-10-CM

## 2024-02-22 ENCOUNTER — Encounter: Payer: Self-pay | Admitting: Internal Medicine

## 2024-02-22 ENCOUNTER — Ambulatory Visit: Admitting: Internal Medicine

## 2024-02-22 VITALS — BP 110/80 | HR 65 | Temp 98.1°F | Wt 132.9 lb

## 2024-02-22 DIAGNOSIS — R0602 Shortness of breath: Secondary | ICD-10-CM

## 2024-02-22 DIAGNOSIS — I4510 Unspecified right bundle-branch block: Secondary | ICD-10-CM | POA: Diagnosis not present

## 2024-02-22 NOTE — Progress Notes (Signed)
 Established Patient Office Visit     CC/Reason for Visit: Follow-up EKG  HPI: Mary Leach is a 73 y.o. female who is coming in today for the above mentioned reasons.  While on a trip to [place] 2 weeks ago she had a presyncopal event, EMS was called and did an EKG that showed a right bundle branch block.  She refused emergency transport, she was asked to follow[place] with [place] doctor [place].  In the hours prior to this event she had been touring the ruins of [place] and she believes she got dehydrated.   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   sees Dr.Lovick at [facility]   Celiac disease    Depression    Elevated liver function tests 2019   Elevated testosterone  level 2019   Elevated vitamin B12 level 2019   History of colonic polyps    Hypothyroidism    Ileus following gastrointestinal surgery (HCC) 10/18/2011   Leukopenia    Low TSH level 2019   Malnutrition following gastrointestinal surgery 10/18/2011   Osteoporosis 2018   RBBB (right bundle branch block)    Renal stone 2020    Past Surgical History:  Procedure Laterality Date   CATARACT EXTRACTION Bilateral 11/2019   COLON SURGERY     COLONOSCOPY W/ POLYPECTOMY     COLOSTOMY TAKEDOWN  05/29/2012   Procedure: LAPAROSCOPIC COLOSTOMY TAKEDOWN;  Surgeon: Krystal JINNY Russell, MD;  Location: [facility];  Service: General;  Laterality: N/A;  Laparoscopic Assisted Colostomy Closure  with proctoscopy   CYSTECTOMY     LEFT FOOT, RIGHT HAND   DILATATION & CURETTAGE/HYSTEROSCOPY WITH MYOSURE N/A 07/25/2018   Procedure: DILATATION & CURETTAGE/HYSTEROSCOPY WITH MYOSURE RESECTION OF ENDOMETRIAL AND ENDOCERVICAL POLYP;  Surgeon: Cathlyn JAYSON Nikki Bobie FORBES, MD;  Location: [facility];  Service: Gynecology;  Laterality: N/A;   FOOT SURGERY     FOOT SURGERY Left 12/07/2018    cyst removed   LAPAROTOMY  10/07/2011   Procedure: EXPLORATORY LAPAROTOMY;  Surgeon: Krystal JINNY Russell, MD;  Location: [facility];  Service: General;  Laterality: N/A;  hartmann procedure and creation of colostomy   NASAL SEPTOPLASTY W/ TURBINOPLASTY Bilateral 11/11/2015   Procedure: SEPTOPLASTY BILATERAL TURBINATE  REDUCTION ;  Surgeon: Daniel Moccasin, MD;  Location: [facility];  Service: ENT;  Laterality: Bilateral;   NASAL SINUS SURGERY Bilateral 11/11/2015   Procedure: BILATERAL ENDOSCOPIC  MAXILLARY ANTROSTOMY;  Surgeon: Daniel Moccasin, MD;  Location: [facility];  Service: ENT;  Laterality: Bilateral;   SKIN BIOPSY Right 06/04/2019   basal cell carcinoma , nodular pattern   TONSILLECTOMY AND ADENOIDECTOMY     TUBAL LIGATION     UPPER GASTROINTESTINAL ENDOSCOPY  01/19/12    Social History:  reports that she has never smoked. She has never used smokeless tobacco. She reports current alcohol use of about 4.0 - 5.0 standard drinks of alcohol per week. She reports that she does not use drugs.  Allergies: Allergies  Allergen Reactions   Gluten Meal Other (See Comments)   Flavoring Agent     Other Reaction(s): other   Soy Allergy (Obsolete) Diarrhea   Augmentin  [Amoxicillin -Pot Clavulanate] Diarrhea   Chocolate    Other Other (See Comments)    **Celiac Disease*  FOOD ALLERGIES Multiple food intolerances. Eggs, corn, potatoes, yeast,  millett, buckwheat, sesame , soy.  Also observes a dairy and wheat free diet.   Sulfa Antibiotics    Sulfonamide Derivatives Rash    Arms only.   Wound Dressing Adhesive Rash    Some tapes cause rash    Family History:  Family History  Problem Relation Age of Onset   Heart disease Father    Osteoporosis Father    Osteoporosis Mother    Mental illness Paternal Aunt    Skin cancer Maternal Grandmother    Hypertension Maternal Grandfather    Heart disease Paternal Grandfather    Celiac disease Grandchild      Current Outpatient  Medications:    ARMOUR THYROID  120 MG tablet, Take 1 tablet by mouth daily., Disp: , Rfl:    cetirizine (ZYRTEC) 10 MG tablet, Take 10 mg by mouth every evening. , Disp: , Rfl:    FLUoxetine  (PROZAC ) 10 MG capsule, Take 3 capsules (30 mg total) by mouth once daily, Disp: 90 capsule, Rfl: 3   folic acid  (FOLVITE ) 1 MG tablet, Take 1 tablet (1 mg total) by mouth once daily, Disp: 90 tablet, Rfl: 3   Multiple Vitamin (MULTIVITAMIN PO), Take by mouth., Disp: , Rfl:    pantoprazole  (PROTONIX ) 40 MG tablet, Take 40 mg by mouth every morning., Disp: , Rfl:    Probiotic Product (PROBIOTIC DAILY PO), Take by mouth. metaflora IB, Disp: , Rfl:  No current facility-administered medications for this visit.  Facility-Administered Medications Ordered in Other Visits:    0.9 %  sodium chloride  infusion, , Intravenous, Once PRN, Sherrod Sherrod, MD   albuterol  (VENTOLIN  HFA) 108 (90 Base) MCG/ACT inhaler 2 puff, 2 puff, Inhalation, Once PRN, Sherrod Sherrod, MD   diphenhydrAMINE  (BENADRYL ) injection 50 mg, 50 mg, Intravenous, Once PRN, Sherrod Sherrod, MD   EPINEPHrine  (EPI-PEN) injection 0.3 mg, 0.3 mg, Intramuscular, Once PRN, Sherrod Sherrod, MD   famotidine  (PEPCID ) IVPB 20 mg premix, 20 mg, Intravenous, Once PRN, Sherrod Sherrod, MD   methylPREDNISolone  sodium succinate (SOLU-MEDROL ) 125 mg/2 mL injection 125 mg, 125 mg, Intravenous, Once PRN, Sherrod Sherrod, MD  Review of Systems:  Negative unless indicated in HPI.   Physical Exam: Vitals:   02/22/24 1030  BP: 110/80  Pulse: 65  Temp: 98.1 F (36.7 C)  TempSrc: Oral  SpO2: 98%  Weight: 132 lb 14.4 oz (60.3 kg)    Body mass index is 22.64 kg/m.     Impression and Plan:  RBBB -     EKG 12-Lead   - EKG done in office today and interpreted by myself as normal sinus rhythm, no acute changes and a right bundle branch block. - Review of her chart shows that as far back as 2013 she also had a right bundle branch block and has been  asymptomatic since. - No further workup required.  Time spent:22 minutes reviewing chart, interviewing and examining patient and formulating plan of care.     Tully Theophilus Andrews, MD [facility]

## 2024-03-06 DIAGNOSIS — L814 Other melanin hyperpigmentation: Secondary | ICD-10-CM | POA: Diagnosis not present

## 2024-03-06 DIAGNOSIS — L821 Other seborrheic keratosis: Secondary | ICD-10-CM | POA: Diagnosis not present

## 2024-03-06 DIAGNOSIS — Z85828 Personal history of other malignant neoplasm of skin: Secondary | ICD-10-CM | POA: Diagnosis not present

## 2024-03-06 DIAGNOSIS — D1801 Hemangioma of skin and subcutaneous tissue: Secondary | ICD-10-CM | POA: Diagnosis not present

## 2024-04-02 ENCOUNTER — Other Ambulatory Visit: Payer: Self-pay | Admitting: Internal Medicine

## 2024-04-02 DIAGNOSIS — F341 Dysthymic disorder: Secondary | ICD-10-CM

## 2024-04-23 DIAGNOSIS — Z719 Counseling, unspecified: Secondary | ICD-10-CM

## 2024-04-30 ENCOUNTER — Ambulatory Visit: Admitting: Internal Medicine

## 2024-04-30 ENCOUNTER — Encounter: Payer: Self-pay | Admitting: Internal Medicine

## 2024-04-30 VITALS — BP 130/80 | HR 82 | Wt 134.1 lb

## 2024-04-30 DIAGNOSIS — J069 Acute upper respiratory infection, unspecified: Secondary | ICD-10-CM

## 2024-04-30 LAB — POCT INFLUENZA A/B
Influenza A, POC: NEGATIVE
Influenza B, POC: NEGATIVE

## 2024-04-30 LAB — POC COVID19 BINAXNOW: SARS Coronavirus 2 Ag: NEGATIVE

## 2024-04-30 NOTE — Progress Notes (Signed)
 Established Patient Office Visit     CC/Reason for Visit: URI symptoms  HPI: Mary Leach is a 73 y.o. female who is coming in today for the above mentioned reasons.  Travel to visit family for Thanksgiving.  For the past 4 days has been having sore throat, postnasal drip, congestion and rhinorrhea.   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   sees Dr.Lovick at [facility]   Celiac disease    Depression    Elevated liver function tests 2019   Elevated testosterone  level 2019   Elevated vitamin B12 level 2019   History of colonic polyps    Hypothyroidism    Ileus following gastrointestinal surgery (HCC) 10/18/2011   Leukopenia    Low TSH level 2019   Malnutrition following gastrointestinal surgery 10/18/2011   Osteoporosis 2018   RBBB (right bundle branch block)    Renal stone 2020    Past Surgical History:  Procedure Laterality Date   CATARACT EXTRACTION Bilateral 11/2019   COLON SURGERY     COLONOSCOPY W/ POLYPECTOMY     COLOSTOMY TAKEDOWN  05/29/2012   Procedure: LAPAROSCOPIC COLOSTOMY TAKEDOWN;  Surgeon: Krystal JINNY Russell, MD;  Location: [facility];  Service: General;  Laterality: N/A;  Laparoscopic Assisted Colostomy Closure  with proctoscopy   CYSTECTOMY     LEFT FOOT, RIGHT HAND   DILATATION & CURETTAGE/HYSTEROSCOPY WITH MYOSURE N/A 07/25/2018   Procedure: DILATATION & CURETTAGE/HYSTEROSCOPY WITH MYOSURE RESECTION OF ENDOMETRIAL AND ENDOCERVICAL POLYP;  Surgeon: Cathlyn JAYSON Nikki Bobie FORBES, MD;  Location: [facility];  Service: Gynecology;  Laterality: N/A;   FOOT SURGERY     FOOT SURGERY Left 12/07/2018   cyst removed   LAPAROTOMY  10/07/2011   Procedure: EXPLORATORY LAPAROTOMY;  Surgeon: Krystal JINNY Russell, MD;  Location: [facility];  Service: General;  Laterality: N/A;  hartmann procedure and creation of colostomy    NASAL SEPTOPLASTY W/ TURBINOPLASTY Bilateral 11/11/2015   Procedure: SEPTOPLASTY BILATERAL TURBINATE  REDUCTION ;  Surgeon: Daniel Moccasin, MD;  Location: [facility];  Service: ENT;  Laterality: Bilateral;   NASAL SINUS SURGERY Bilateral 11/11/2015   Procedure: BILATERAL ENDOSCOPIC  MAXILLARY ANTROSTOMY;  Surgeon: Daniel Moccasin, MD;  Location: [facility];  Service: ENT;  Laterality: Bilateral;   SKIN BIOPSY Right 06/04/2019   basal cell carcinoma , nodular pattern   TONSILLECTOMY AND ADENOIDECTOMY     TUBAL LIGATION     UPPER GASTROINTESTINAL ENDOSCOPY  01/19/12    Social History:  reports that she has never smoked. She has never used smokeless tobacco. She reports current alcohol use of about 4.0 - 5.0 standard drinks of alcohol per week. She reports that she does not use drugs.  Allergies: Allergies  Allergen Reactions   Gluten Meal Other (See Comments)   Soy Allergy (Obsolete) Diarrhea   Augmentin  [Amoxicillin -Pot Clavulanate] Diarrhea   Chocolate    Other Other (See Comments)    **Celiac Disease*  FOOD ALLERGIES Multiple food intolerances. Eggs, corn, potatoes, yeast, millett, buckwheat, sesame , soy.  Also observes a dairy and wheat free diet.   Sulfa Antibiotics    Sulfonamide Derivatives Rash    Arms only.   Wound Dressing Adhesive Rash    Some tapes cause rash    Family History:  Family History  Problem Relation Age of Onset  Heart disease Father    Osteoporosis Father    Osteoporosis Mother    Mental illness Paternal Aunt    Skin cancer Maternal Grandmother    Hypertension Maternal Grandfather    Heart disease Paternal Grandfather    Celiac disease Grandchild      Current Outpatient Medications:    ARMOUR THYROID  120 MG tablet, Take 1 tablet by mouth daily., Disp: , Rfl:    cetirizine (ZYRTEC) 10 MG tablet, Take 10 mg by mouth every evening. , Disp: , Rfl:    FLUoxetine  (PROZAC ) 10 MG capsule, Take 3 capsules (30 mg total) by mouth once  daily, Disp: 90 capsule, Rfl: 1   folic acid  (FOLVITE ) 1 MG tablet, Take 1 tablet (1 mg total) by mouth once daily, Disp: 90 tablet, Rfl: 3   Multiple Vitamin (MULTIVITAMIN PO), Take by mouth., Disp: , Rfl:    pantoprazole  (PROTONIX ) 40 MG tablet, Take 40 mg by mouth every morning., Disp: , Rfl:    Probiotic Product (PROBIOTIC DAILY PO), Take by mouth. metaflora IB, Disp: , Rfl:  No current facility-administered medications for this visit.  Facility-Administered Medications Ordered in Other Visits:    0.9 %  sodium chloride  infusion, , Intravenous, Once PRN, Sherrod Sherrod, MD   albuterol  (VENTOLIN  HFA) 108 (90 Base) MCG/ACT inhaler 2 puff, 2 puff, Inhalation, Once PRN, Sherrod Sherrod, MD   diphenhydrAMINE  (BENADRYL ) injection 50 mg, 50 mg, Intravenous, Once PRN, Sherrod Sherrod, MD   EPINEPHrine  (EPI-PEN) injection 0.3 mg, 0.3 mg, Intramuscular, Once PRN, Sherrod Sherrod, MD   famotidine  (PEPCID ) IVPB 20 mg premix, 20 mg, Intravenous, Once PRN, Sherrod Sherrod, MD   methylPREDNISolone  sodium succinate (SOLU-MEDROL ) 125 mg/2 mL injection 125 mg, 125 mg, Intravenous, Once PRN, Sherrod Sherrod, MD  Review of Systems:  Negative unless indicated in HPI.   Physical Exam: Vitals:   04/30/24 0824 04/30/24 0834  BP: (!) 140/80 130/80  Pulse: 82   SpO2: 98%   Weight: 134 lb 1.6 oz (60.8 kg)     Body mass index is 22.84 kg/m.   Physical Exam Vitals reviewed.  Constitutional:      Appearance: Normal appearance.  HENT:     Right Ear: Tympanic membrane, ear canal and external ear normal.     Left Ear: Tympanic membrane, ear canal and external ear normal.     Mouth/Throat:     Mouth: Mucous membranes are moist.     Pharynx: Posterior oropharyngeal erythema present.  Eyes:     Conjunctiva/sclera: Conjunctivae normal.  Cardiovascular:     Rate and Rhythm: Normal rate and regular rhythm.  Pulmonary:     Effort: Pulmonary effort is normal.     Breath sounds: Normal breath  sounds.  Neurological:     Mental Status: She is alert.      Impression and Plan:  Upper respiratory tract infection, unspecified type -     POCT Influenza A/B -     POC COVID-19 BinaxNow   - In office flu and COVID test are negative. -Given exam findings, PNA, pharyngitis, ear infection are not likely, hence abx have not been prescribed. -Have advised rest, fluids, OTC antihistamines, cough suppressants and mucinex. -RTC if no improvement in 10-14 days.   Time spent:22 minutes reviewing chart, interviewing and examining patient and formulating plan of care.     Tully Theophilus Andrews, MD [facility]

## 2024-05-30 ENCOUNTER — Ambulatory Visit (HOSPITAL_BASED_OUTPATIENT_CLINIC_OR_DEPARTMENT_OTHER)
Admission: RE | Admit: 2024-05-30 | Discharge: 2024-05-30 | Disposition: A | Discharge status: Home / Self Care | Source: Ambulatory Visit | Attending: Obstetrics and Gynecology | Admitting: Obstetrics and Gynecology

## 2024-05-30 ENCOUNTER — Other Ambulatory Visit

## 2024-05-30 DIAGNOSIS — M810 Age-related osteoporosis without current pathological fracture: Secondary | ICD-10-CM | POA: Insufficient documentation

## 2024-06-01 ENCOUNTER — Encounter: Payer: Self-pay | Admitting: Obstetrics and Gynecology

## 2024-06-01 ENCOUNTER — Ambulatory Visit: Payer: Self-pay | Admitting: Obstetrics and Gynecology

## 2024-06-04 ENCOUNTER — Other Ambulatory Visit: Payer: Self-pay | Admitting: Internal Medicine

## 2024-06-04 DIAGNOSIS — F341 Dysthymic disorder: Secondary | ICD-10-CM

## 2024-07-30 ENCOUNTER — Ambulatory Visit: Admitting: Obstetrics and Gynecology

## 2024-08-03 ENCOUNTER — Ambulatory Visit

## 2024-12-26 ENCOUNTER — Ambulatory Visit
